# Patient Record
Sex: Female | Born: 1950 | Race: White | Hispanic: No | Marital: Single | Smoking: Never smoker
Health system: Southern US, Community
[De-identification: ages and names within clinical notes are randomized; demographics above are authoritative.]

## PROBLEM LIST (undated history)

## (undated) DIAGNOSIS — I10 Essential (primary) hypertension: Secondary | ICD-10-CM

## (undated) DIAGNOSIS — T8859XA Other complications of anesthesia, initial encounter: Secondary | ICD-10-CM

## (undated) DIAGNOSIS — R42 Dizziness and giddiness: Secondary | ICD-10-CM

## (undated) DIAGNOSIS — L4050 Arthropathic psoriasis, unspecified: Secondary | ICD-10-CM

## (undated) DIAGNOSIS — H269 Unspecified cataract: Secondary | ICD-10-CM

## (undated) DIAGNOSIS — M1990 Unspecified osteoarthritis, unspecified site: Secondary | ICD-10-CM

## (undated) DIAGNOSIS — E119 Type 2 diabetes mellitus without complications: Secondary | ICD-10-CM

## (undated) DIAGNOSIS — H409 Unspecified glaucoma: Secondary | ICD-10-CM

## (undated) HISTORY — PX: CARPAL TUNNEL RELEASE: SHX101

## (undated) HISTORY — PX: OTHER SURGICAL HISTORY: SHX169

## (undated) HISTORY — PX: FEMUR FRACTURE SURGERY: SHX633

## (undated) HISTORY — DX: Dizziness and giddiness: R42

## (undated) HISTORY — PX: CHOLECYSTECTOMY: SHX55

## (undated) HISTORY — DX: Essential (primary) hypertension: I10

## (undated) HISTORY — DX: Unspecified glaucoma: H40.9

## (undated) HISTORY — DX: Unspecified osteoarthritis, unspecified site: M19.90

## (undated) HISTORY — PX: TRIGGER FINGER RELEASE: SHX641

## (undated) HISTORY — PX: APPENDECTOMY: SHX54

## (undated) HISTORY — DX: Type 2 diabetes mellitus without complications: E11.9

## (undated) HISTORY — PX: TOTAL SHOULDER ARTHROPLASTY: SHX126

## (undated) HISTORY — DX: Unspecified cataract: H26.9

## (undated) HISTORY — PX: ABDOMINAL HYSTERECTOMY: SHX81

## (undated) HISTORY — PX: REPLACEMENT TOTAL KNEE BILATERAL: SUR1225

---

## 2002-03-24 ENCOUNTER — Encounter: Payer: Self-pay | Admitting: Orthopedic Surgery

## 2002-03-24 ENCOUNTER — Encounter: Admission: RE | Admit: 2002-03-24 | Discharge: 2002-03-24 | Payer: Self-pay | Admitting: Orthopedic Surgery

## 2002-08-25 ENCOUNTER — Encounter: Payer: Self-pay | Admitting: Orthopedic Surgery

## 2002-08-31 ENCOUNTER — Encounter (INDEPENDENT_AMBULATORY_CARE_PROVIDER_SITE_OTHER): Payer: Self-pay | Admitting: Specialist

## 2002-08-31 ENCOUNTER — Inpatient Hospital Stay (HOSPITAL_COMMUNITY): Admission: RE | Admit: 2002-08-31 | Discharge: 2002-09-03 | Payer: Self-pay | Admitting: Orthopedic Surgery

## 2002-10-22 ENCOUNTER — Encounter: Payer: Self-pay | Admitting: Orthopedic Surgery

## 2002-10-22 ENCOUNTER — Encounter: Admission: RE | Admit: 2002-10-22 | Discharge: 2002-10-22 | Payer: Self-pay | Admitting: Orthopedic Surgery

## 2003-02-26 ENCOUNTER — Encounter: Admission: RE | Admit: 2003-02-26 | Discharge: 2003-02-26 | Payer: Self-pay | Admitting: Rheumatology

## 2003-03-22 ENCOUNTER — Encounter: Admission: RE | Admit: 2003-03-22 | Discharge: 2003-03-22 | Payer: Self-pay | Admitting: Orthopedic Surgery

## 2003-04-14 ENCOUNTER — Ambulatory Visit (HOSPITAL_COMMUNITY): Admission: RE | Admit: 2003-04-14 | Discharge: 2003-04-14 | Payer: Self-pay | Admitting: Orthopedic Surgery

## 2003-04-14 ENCOUNTER — Ambulatory Visit (HOSPITAL_BASED_OUTPATIENT_CLINIC_OR_DEPARTMENT_OTHER): Admission: RE | Admit: 2003-04-14 | Discharge: 2003-04-14 | Payer: Self-pay | Admitting: Orthopedic Surgery

## 2003-07-05 ENCOUNTER — Inpatient Hospital Stay (HOSPITAL_COMMUNITY): Admission: RE | Admit: 2003-07-05 | Discharge: 2003-07-14 | Payer: Self-pay | Admitting: Orthopedic Surgery

## 2003-07-14 ENCOUNTER — Inpatient Hospital Stay (HOSPITAL_COMMUNITY)
Admission: RE | Admit: 2003-07-14 | Discharge: 2003-07-22 | Payer: Self-pay | Admitting: Physical Medicine & Rehabilitation

## 2003-08-04 ENCOUNTER — Ambulatory Visit (HOSPITAL_COMMUNITY): Admission: RE | Admit: 2003-08-04 | Discharge: 2003-08-04 | Payer: Self-pay | Admitting: Nephrology

## 2003-12-21 ENCOUNTER — Encounter: Admission: RE | Admit: 2003-12-21 | Discharge: 2003-12-21 | Payer: Self-pay | Admitting: Rheumatology

## 2006-12-16 ENCOUNTER — Encounter: Admission: RE | Admit: 2006-12-16 | Discharge: 2006-12-16 | Payer: Self-pay | Admitting: Orthopedic Surgery

## 2008-09-23 ENCOUNTER — Ambulatory Visit: Payer: Self-pay | Admitting: Hematology & Oncology

## 2008-10-20 LAB — CBC WITH DIFFERENTIAL (CANCER CENTER ONLY)
BASO#: 0.1 10*3/uL (ref 0.0–0.2)
Eosinophils Absolute: 0.1 10*3/uL (ref 0.0–0.5)
HCT: 39 % (ref 34.8–46.6)
LYMPH%: 19 % (ref 14.0–48.0)
MCH: 30.8 pg (ref 26.0–34.0)
MCV: 92 fL (ref 81–101)
MONO#: 0.9 10*3/uL (ref 0.1–0.9)
MONO%: 7.2 % (ref 0.0–13.0)
NEUT%: 72.2 % (ref 39.6–80.0)
RBC: 4.25 10*6/uL (ref 3.70–5.32)
WBC: 12.7 10*3/uL — ABNORMAL HIGH (ref 3.9–10.0)

## 2008-10-22 LAB — LACTATE DEHYDROGENASE: LDH: 177 U/L (ref 94–250)

## 2009-04-12 ENCOUNTER — Encounter: Admission: RE | Admit: 2009-04-12 | Discharge: 2009-06-21 | Payer: Self-pay | Admitting: Orthopedic Surgery

## 2009-06-28 ENCOUNTER — Encounter (HOSPITAL_COMMUNITY): Admission: RE | Admit: 2009-06-28 | Discharge: 2009-09-26 | Payer: Self-pay | Admitting: Rheumatology

## 2009-10-05 ENCOUNTER — Encounter (HOSPITAL_COMMUNITY): Admission: RE | Admit: 2009-10-05 | Discharge: 2010-01-03 | Payer: Self-pay | Admitting: Rheumatology

## 2009-10-11 ENCOUNTER — Encounter: Admission: RE | Admit: 2009-10-11 | Discharge: 2009-11-21 | Payer: Self-pay | Admitting: Rheumatology

## 2010-01-06 ENCOUNTER — Inpatient Hospital Stay (HOSPITAL_COMMUNITY): Admission: EM | Admit: 2010-01-06 | Discharge: 2010-01-13 | Payer: Self-pay | Admitting: Emergency Medicine

## 2010-01-09 ENCOUNTER — Ambulatory Visit: Payer: Self-pay | Admitting: Physical Medicine & Rehabilitation

## 2010-02-22 ENCOUNTER — Encounter
Admission: RE | Admit: 2010-02-22 | Discharge: 2010-03-21 | Payer: Self-pay | Source: Home / Self Care | Attending: Orthopedic Surgery | Admitting: Orthopedic Surgery

## 2010-03-13 ENCOUNTER — Other Ambulatory Visit: Payer: Self-pay | Admitting: Orthopedic Surgery

## 2010-03-13 DIAGNOSIS — Z780 Asymptomatic menopausal state: Secondary | ICD-10-CM

## 2010-03-15 ENCOUNTER — Encounter
Admission: RE | Admit: 2010-03-15 | Discharge: 2010-03-15 | Payer: Self-pay | Source: Home / Self Care | Attending: Orthopedic Surgery | Admitting: Orthopedic Surgery

## 2010-03-15 ENCOUNTER — Other Ambulatory Visit: Payer: Self-pay

## 2010-03-23 ENCOUNTER — Ambulatory Visit: Payer: Medicare Other | Attending: Orthopedic Surgery | Admitting: Physical Therapy

## 2010-03-23 DIAGNOSIS — R262 Difficulty in walking, not elsewhere classified: Secondary | ICD-10-CM | POA: Insufficient documentation

## 2010-03-23 DIAGNOSIS — M25569 Pain in unspecified knee: Secondary | ICD-10-CM | POA: Insufficient documentation

## 2010-03-23 DIAGNOSIS — M25669 Stiffness of unspecified knee, not elsewhere classified: Secondary | ICD-10-CM | POA: Insufficient documentation

## 2010-03-23 DIAGNOSIS — IMO0001 Reserved for inherently not codable concepts without codable children: Secondary | ICD-10-CM | POA: Insufficient documentation

## 2010-03-27 ENCOUNTER — Ambulatory Visit: Payer: Medicare Other | Admitting: Physical Therapy

## 2010-03-27 ENCOUNTER — Ambulatory Visit: Payer: Medicare Other | Admitting: Rehabilitation

## 2010-03-27 ENCOUNTER — Encounter: Payer: Medicare Other | Admitting: Rehabilitation

## 2010-03-27 ENCOUNTER — Encounter: Payer: Medicare Other | Admitting: Physical Therapy

## 2010-03-29 ENCOUNTER — Ambulatory Visit: Payer: Medicare Other | Admitting: Physical Therapy

## 2010-04-03 ENCOUNTER — Ambulatory Visit: Payer: Medicare Other | Admitting: Physical Therapy

## 2010-04-07 ENCOUNTER — Ambulatory Visit: Payer: Medicare Other | Admitting: Rehabilitation

## 2010-04-11 ENCOUNTER — Ambulatory Visit: Payer: Medicare Other | Admitting: Rehabilitation

## 2010-04-13 ENCOUNTER — Ambulatory Visit: Payer: Medicare Other | Admitting: Rehabilitation

## 2010-04-18 ENCOUNTER — Ambulatory Visit: Payer: Medicare Other | Admitting: Physical Therapy

## 2010-04-20 ENCOUNTER — Ambulatory Visit: Payer: Medicare Other | Attending: Orthopaedic Surgery | Admitting: Rehabilitation

## 2010-04-20 ENCOUNTER — Encounter: Payer: Medicare Other | Admitting: Physical Therapy

## 2010-04-20 DIAGNOSIS — IMO0001 Reserved for inherently not codable concepts without codable children: Secondary | ICD-10-CM | POA: Insufficient documentation

## 2010-04-20 DIAGNOSIS — R262 Difficulty in walking, not elsewhere classified: Secondary | ICD-10-CM | POA: Insufficient documentation

## 2010-04-20 DIAGNOSIS — M25569 Pain in unspecified knee: Secondary | ICD-10-CM | POA: Insufficient documentation

## 2010-04-20 DIAGNOSIS — M25669 Stiffness of unspecified knee, not elsewhere classified: Secondary | ICD-10-CM | POA: Insufficient documentation

## 2010-04-25 ENCOUNTER — Ambulatory Visit: Payer: Medicare Other | Admitting: Rehabilitation

## 2010-04-27 ENCOUNTER — Ambulatory Visit: Payer: Medicare Other | Admitting: Rehabilitation

## 2010-05-02 ENCOUNTER — Ambulatory Visit: Payer: Medicare Other | Admitting: Rehabilitation

## 2010-05-02 LAB — GLUCOSE, CAPILLARY
Glucose-Capillary: 120 mg/dL — ABNORMAL HIGH (ref 70–99)
Glucose-Capillary: 152 mg/dL — ABNORMAL HIGH (ref 70–99)
Glucose-Capillary: 155 mg/dL — ABNORMAL HIGH (ref 70–99)
Glucose-Capillary: 157 mg/dL — ABNORMAL HIGH (ref 70–99)
Glucose-Capillary: 165 mg/dL — ABNORMAL HIGH (ref 70–99)
Glucose-Capillary: 167 mg/dL — ABNORMAL HIGH (ref 70–99)
Glucose-Capillary: 172 mg/dL — ABNORMAL HIGH (ref 70–99)
Glucose-Capillary: 183 mg/dL — ABNORMAL HIGH (ref 70–99)
Glucose-Capillary: 194 mg/dL — ABNORMAL HIGH (ref 70–99)
Glucose-Capillary: 201 mg/dL — ABNORMAL HIGH (ref 70–99)
Glucose-Capillary: 201 mg/dL — ABNORMAL HIGH (ref 70–99)
Glucose-Capillary: 205 mg/dL — ABNORMAL HIGH (ref 70–99)
Glucose-Capillary: 212 mg/dL — ABNORMAL HIGH (ref 70–99)
Glucose-Capillary: 235 mg/dL — ABNORMAL HIGH (ref 70–99)
Glucose-Capillary: 268 mg/dL — ABNORMAL HIGH (ref 70–99)
Glucose-Capillary: 299 mg/dL — ABNORMAL HIGH (ref 70–99)
Glucose-Capillary: 63 mg/dL — ABNORMAL LOW (ref 70–99)

## 2010-05-02 LAB — CBC
HCT: 24.4 % — ABNORMAL LOW (ref 36.0–46.0)
HCT: 28.3 % — ABNORMAL LOW (ref 36.0–46.0)
HCT: 31.9 % — ABNORMAL LOW (ref 36.0–46.0)
Hemoglobin: 10.4 g/dL — ABNORMAL LOW (ref 12.0–15.0)
Hemoglobin: 7.4 g/dL — ABNORMAL LOW (ref 12.0–15.0)
MCH: 30.1 pg (ref 26.0–34.0)
MCH: 30.5 pg (ref 26.0–34.0)
MCHC: 32.2 g/dL (ref 30.0–36.0)
MCHC: 32.6 g/dL (ref 30.0–36.0)
MCHC: 32.9 g/dL (ref 30.0–36.0)
MCV: 92.5 fL (ref 78.0–100.0)
MCV: 93.1 fL (ref 78.0–100.0)
MCV: 94.7 fL (ref 78.0–100.0)
MCV: 94.9 fL (ref 78.0–100.0)
Platelets: 153 10*3/uL (ref 150–400)
Platelets: 176 10*3/uL (ref 150–400)
RBC: 2.57 MIL/uL — ABNORMAL LOW (ref 3.87–5.11)
RBC: 3.45 MIL/uL — ABNORMAL LOW (ref 3.87–5.11)
RDW: 15 % (ref 11.5–15.5)
RDW: 15.7 % — ABNORMAL HIGH (ref 11.5–15.5)
WBC: 12.1 10*3/uL — ABNORMAL HIGH (ref 4.0–10.5)
WBC: 9.5 10*3/uL (ref 4.0–10.5)

## 2010-05-02 LAB — PROTIME-INR
INR: 1.47 (ref 0.00–1.49)
INR: 1.72 — ABNORMAL HIGH (ref 0.00–1.49)
INR: 1.96 — ABNORMAL HIGH (ref 0.00–1.49)
INR: 2.73 — ABNORMAL HIGH (ref 0.00–1.49)
INR: 3 — ABNORMAL HIGH (ref 0.00–1.49)
Prothrombin Time: 20.3 seconds — ABNORMAL HIGH (ref 11.6–15.2)
Prothrombin Time: 22.5 seconds — ABNORMAL HIGH (ref 11.6–15.2)
Prothrombin Time: 29 seconds — ABNORMAL HIGH (ref 11.6–15.2)

## 2010-05-02 LAB — CROSSMATCH
ABO/RH(D): O POS
Antibody Screen: NEGATIVE
Unit division: 0
Unit division: 0
Unit division: 0
Unit division: 0

## 2010-05-02 LAB — BASIC METABOLIC PANEL
BUN: 15 mg/dL (ref 6–23)
BUN: 6 mg/dL (ref 6–23)
CO2: 28 mEq/L (ref 19–32)
Calcium: 8.4 mg/dL (ref 8.4–10.5)
Chloride: 105 mEq/L (ref 96–112)
Chloride: 106 mEq/L (ref 96–112)
Chloride: 107 mEq/L (ref 96–112)
Creatinine, Ser: 0.86 mg/dL (ref 0.4–1.2)
GFR calc Af Amer: >60 mL/min (ref >60)
GFR calc Af Amer: >60 mL/min (ref >60)
GFR calc non Af Amer: >60 mL/min (ref >60)
GFR calc non Af Amer: >60 mL/min (ref >60)
Glucose, Bld: 218 mg/dL — ABNORMAL HIGH (ref 70–99)
Glucose, Bld: 222 mg/dL — ABNORMAL HIGH (ref 70–99)
Potassium: 3.7 mEq/L (ref 3.5–5.1)
Potassium: 4.2 mEq/L (ref 3.5–5.1)
Potassium: 4.4 mEq/L (ref 3.5–5.1)
Sodium: 140 mEq/L (ref 135–145)

## 2010-05-02 LAB — URINALYSIS, ROUTINE W REFLEX MICROSCOPIC
Bilirubin Urine: NEGATIVE
Glucose, UA: NEGATIVE mg/dL
Protein, ur: NEGATIVE mg/dL
Urobilinogen, UA: 0.2 mg/dL (ref 0.0–1.0)

## 2010-05-02 LAB — COMPREHENSIVE METABOLIC PANEL
AST: 15 U/L (ref 0–37)
BUN: 9 mg/dL (ref 6–23)
CO2: 26 mEq/L (ref 19–32)
Calcium: 8.1 mg/dL — ABNORMAL LOW (ref 8.4–10.5)
Creatinine, Ser: 0.61 mg/dL (ref 0.4–1.2)
GFR calc Af Amer: >60 mL/min (ref >60)
GFR calc non Af Amer: >60 mL/min (ref >60)

## 2010-05-02 LAB — URINE MICROSCOPIC-ADD ON

## 2010-05-02 LAB — POCT I-STAT 4, (NA,K, GLUC, HGB,HCT)
Glucose, Bld: 210 mg/dL — ABNORMAL HIGH (ref 70–99)
Potassium: 4.6 mEq/L (ref 3.5–5.1)

## 2010-05-02 LAB — ABO/RH: ABO/RH(D): O POS

## 2010-05-03 ENCOUNTER — Encounter (HOSPITAL_COMMUNITY): Payer: Medicare Other

## 2010-05-04 ENCOUNTER — Ambulatory Visit: Payer: Medicare Other | Admitting: Physical Therapy

## 2010-05-09 ENCOUNTER — Ambulatory Visit: Payer: Medicare Other | Admitting: Physical Therapy

## 2010-05-10 ENCOUNTER — Ambulatory Visit (HOSPITAL_COMMUNITY): Payer: Medicare Other | Attending: Rheumatology

## 2010-05-10 DIAGNOSIS — L4050 Arthropathic psoriasis, unspecified: Secondary | ICD-10-CM | POA: Insufficient documentation

## 2010-05-12 ENCOUNTER — Ambulatory Visit: Payer: Medicare Other | Admitting: Physical Therapy

## 2010-05-16 ENCOUNTER — Ambulatory Visit: Payer: Medicare Other | Admitting: Physical Therapy

## 2010-05-18 ENCOUNTER — Ambulatory Visit: Payer: Medicare Other | Admitting: Physical Therapy

## 2010-05-23 ENCOUNTER — Ambulatory Visit: Payer: Medicare Other | Attending: Orthopedic Surgery | Admitting: Rehabilitation

## 2010-05-23 DIAGNOSIS — M25669 Stiffness of unspecified knee, not elsewhere classified: Secondary | ICD-10-CM | POA: Insufficient documentation

## 2010-05-23 DIAGNOSIS — R262 Difficulty in walking, not elsewhere classified: Secondary | ICD-10-CM | POA: Insufficient documentation

## 2010-05-23 DIAGNOSIS — IMO0001 Reserved for inherently not codable concepts without codable children: Secondary | ICD-10-CM | POA: Insufficient documentation

## 2010-05-23 DIAGNOSIS — M25569 Pain in unspecified knee: Secondary | ICD-10-CM | POA: Insufficient documentation

## 2010-05-25 ENCOUNTER — Ambulatory Visit: Payer: Medicare Other | Admitting: Rehabilitation

## 2010-05-30 ENCOUNTER — Ambulatory Visit: Payer: Medicare Other | Admitting: Rehabilitation

## 2010-06-01 ENCOUNTER — Ambulatory Visit: Payer: Medicare Other | Admitting: Rehabilitation

## 2010-06-06 ENCOUNTER — Encounter: Payer: Medicare Other | Admitting: Rehabilitation

## 2010-06-08 ENCOUNTER — Encounter: Payer: Medicare Other | Admitting: Physical Therapy

## 2010-06-21 ENCOUNTER — Encounter (HOSPITAL_COMMUNITY): Payer: Medicare Other

## 2010-07-07 NOTE — H&P (Signed)
[name], [name]                           ACCOUNT NO.:  [id number]   MEDICAL RECORD NO.:  [id number]                   PATIENT TYPE:  INP   LOCATION:  NA                                   FACILITY:  [facility]   PHYSICIAN:  Mila Homer. Sherlean Foot, M.D.              DATE OF BIRTH:  [birth year]   DATE OF ADMISSION:  07/05/2003  DATE OF DISCHARGE:                                HISTORY & PHYSICAL   CHIEF COMPLAINT:  Left knee pain.   HISTORY OF PRESENT ILLNESS:  The patient is a 60 year old white female with  a history of right total knee arthroplasty and a revision of that  arthroplasty in 2004 with continued pain.  The patient has also been having  progressively worsening left knee pain over the last year.  She does have a  pain which she describes as sharp, achy and dull at times.  It radiates up  into the hip.  She does have grinding, popping, and giving way with that  knee and it is causing her to have problems with her other hip joints.  X-  rays reveal bone on bone lateral compartment of the left knee.   ALLERGIES:  1. PENICILLIN.  2. ASPIRIN.  3. CODEINE.  4. DEMEROL.  5. PHENOTHIAZINES.  6. MORPHINE.  7. NSAIDS.  8. TEQUIN.  9. LEVAQUIN.  10.      ERYTHROMYCIN.  11.      SULFA.   CURRENT MEDICATIONS:  1. Aceon 2 mg p.o. daily.  2. Glucotrol XL 5 mg p.o. daily.  3. Actos 30 mg p.o. daily.  4. Neurontin 300 mg t.i.d.  5. Vicodin p.r.n.   PAST MEDICAL HISTORY:  1. Diabetes mellitus type 2.  2. Hypertension.  3. Obesity.   PAST SURGICAL HISTORY:  1. Cholecystectomy in 1970s.  2. Appendectomy in the 1970s.  3. Hysterectomy in the 1980s.  4. Right total knee arthroplasty in 2002.  5. Revision right total knee arthroplasty in 2004.  6. Right shoulder  rotator cuff repair in 2005.  7. The patient states that with her right total knee arthroplasty, she had     significant nausea, status post procedure for several weeks.   SOCIAL HISTORY:  The patient is a 60 year old  heavyset white female who  denies any history of smoking or alcohol use.  She is divorced.  She has  several grown children.  She lives in a one story house, five steps to the  main entrance.  She is employed as an [employer] fundraiser.   FAMILY PHYSICIAN:  Dr. Niel Hummer, [phone].   FAMILY HISTORY:  Mother deceased from CVA.  Father is alive with history of  COPD.  One brother and one sister alive, both with a history of asthma.   REVIEW OF SYMPTOMS:  Positive for glasses.  She occasionally has problems  with reflux for which she uses over-the-counter medications.  She has some  bladder frequency, otherwise all other review of systems categories are  negative.   PHYSICAL EXAMINATION:  GENERAL APPEARANCE:  This is a healthy-appearing,  well-developed, obese white female.  She walks with a significant swinging  limp from right to left.  She does have difficulty getting on the  examination table.  She is unable to lift her weight with her right leg and  when she uses her left leg, she must push up with her arms.  VITAL SIGNS:  Height is 5 feet 7 inches, weight 250 pounds, blood pressure  152/72, heart rate is 80 and regular, respiratory rate 12, the patient is  afebrile.  HEENT:  Head was normocephalic.  Pupils are equal, round and reactive.  Extraocular movements intact.  External ears without deformities.  Gross  hearing is intact.  Nasal septum is midline.  Oral buccal mucosa was pink  and moist.  No lesions.  NECK:  Supple.  No palpable lymphadenopathy.  Thyroid region nontender.  She  had good range of motion of her cervical spine without any difficulty or  tenderness.  CHEST:  Lung sounds were clear and equal bilaterally.  No wheezing, rhonchi,  rales or rubs noted.  CARDIOVASCULAR:  Regular rate and rhythm, no murmurs, rubs, or gallops.  ABDOMEN:  Round, obese-like.  Nontender to deep palpation.  Bowel sounds  normoactive.  EXTREMITIES:  Upper extremities:  The patient had fairly good  range of  motion of both shoulders, elbows and wrists.  Motor strength was 5/5.  Lower  extremities:  Right hip had full extension, flexion up to 120 degrees  with  20 degrees internal and external rotation without any discomfort.  Left hip  had full extension, flexion up to 100 degrees with any attempt at internal  and external rotation. There was no mechanical blocking but she did have a  pulling discomfort over the lateral aspect at the hip.  No mechanical  symptoms. Right knee had a well-healed midline surgical incision.  The knee  was round, boggy appearing.  She had no palpable effusion.  She was tender  throughout mostly on the lateral tibial tray region.  She had about 10  degrees short of full extension, flexion to 80 degrees, no instability.  Left knee was round, boggy appearing.  No signs of erythema or ecchymosis.  No palpable effusion.  She was extremely tender along the medial and lateral  joint line.  She had full extension, flexion up to 110 degrees.  She had  about 5 or 10 degrees of valgus or varus laxity with stress.  Both calves  were nontender.  Ankles were symmetric with good dorsiflexion and plantar  flexion.  PERIPHERAL VASCULAR:  Carotid pulses were 2+, no bruits.  Radial pulses 2+.  Dorsalis pedis pulses were 2+.  Unable to palpate posterior tibial pulses  due to soft tissue.  The skin blanched briskly.  No venostasis or  pigmentation changes.  She did have 1+ pitting edema in both anterior tibial  pressed regions distally.  NEUROLOGIC:  The patient was conscious, alert and appropriate, held decent  conversation with examiner.  Cranial nerves II-XII grossly intact.  She was  grossly intact to light touch sensation from head to toe.  There were no  gross neurologic defects noted.  BREASTS, RECTAL, GU:  Examinations were deferred at this time.   IMPRESSION:  1. 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 2. Failed right total knee  arthroplasty revision.  3. Obesity.  4.  Diabetes.  5. Hypertension.   PLAN:  The patient will be admitted to [facility] on  Jul 05, 2003, under the care of Dr. Sherlean Foot for planned left total knee  arthroplasty.  The patient will undergo all routine labs and tests prior to  this procedure.      Jamelle Rushing, P.A.                      Mila Homer. Sherlean Foot, M.D.    RWK/MEDQ  D:  06/29/2003  T:  06/29/2003  Job:  [id number]

## 2010-07-07 NOTE — Discharge Summary (Signed)
NAME:  [name], [name]                           ACCOUNT NO.:  [id number]   MEDICAL RECORD NO.:  [id number]                   PATIENT TYPE:  INP   LOCATION:  5027                                 FACILITY:  [facility]   PHYSICIAN:  Mila Homer. Sherlean Foot, M.D.              DATE OF BIRTH:  07-Aug-1950   DATE OF ADMISSION:  08/31/2002  DATE OF DISCHARGE:  09/03/2002                                 DISCHARGE SUMMARY   ADMISSION DIAGNOSES:  1. Chronic pain, right total knee arthroplasty.  2. Type 2 diabetes mellitus.  3. Hypertension.  4. Obesity.   DISCHARGE DIAGNOSES:  1. Chronic pain, right total knee arthroplasty, status post irrigation and     debridement and ligament balancing.  2. Pyrexia.  3. Constipation.  4. Type 2 diabetes mellitus.  5. Hypertension.  6. Obesity.   SURGICAL PROCEDURE:  On August 31, 2002, Ms. Liuzzi underwent an irrigation and  debridement and ligament balancing of her right total knee arthroplasty by  Dr. Mila Homer. Lucey, assisted by Oneida Alar, P.A.-C.   COMPLICATIONS:  None.   CONSULTS:  Case management, physical therapy, occupational therapy, and  [facility] consult on September 01, 2002.   HISTORY OF PRESENT ILLNESS:  This 60 year old white female patient presented  to Dr. Sherlean Foot with a history of a right knee replacement in October 2002 by  another physician.  She has had chronic pain in that knee since that time.  She was treated with closed manipulation after her knee replacement was done  due to decreased range of motion, but this did not help her pain.  The pain  is a chronic achy sensation over the lateral aspect of the knee, which is  worse with weightbearing, sitting, or lying down.  The knee swells, and it  pops, clicks, and buckles on her.  Because of her continued complaints of  pain, and complaints of instability, she is presenting for a right knee  possible revision.   HOSPITAL COURSE:  [name] tolerated her surgical procedure well and  without immediate postoperative complications.  The components were not  loose, so an irrigation and debridement and ligament balance was done and  not a revision.  Postop, she was transferred to 5000.  Her Hemovac was  accidentally DC'd at about midnight the night after surgery, and that was  left out.   On postop day one, T max was 101.9, vitals were stable.  Leg was  neurovascularly intact.  Hemoglobin 11.9, hematocrit 35.  She was started on  physical therapy, and plans were made for DC home in the next one day or so.   On postop day two, her T max had been 102.  She was feeling kind of bad but  no specific complaints, voiding without complaints, and has not had a bowel  movement.  Hemoglobin 10.7, hematocrit 30.8, and white count 11.4.  Her temp  was monitored, and she was kept another day.   On postop day three, she was feeling much better.  T max was 101.5, and her  fever curve had been decreasing with her being 99.2 that morning.  Right  knee incision was well approximated with staples with no signs of infection.  White count 8.8, hemoglobin 9.9, hematocrit 28.5.  She did have some  constipation that was treated with a laxative, but it was felt, after  discussion with Dr. Sherlean Foot, that she was ready for discharge home and will be  DC'd home at this time.   DISCHARGE INSTRUCTIONS:   DIET:  She will resume her regular diabetic diet.   MEDICATIONS:  She may resume her prehospitalization medications except for  the Mobic; these include:  1. Actos 30 mg p.o. daily.  2. Glucotrol XL 5 mg p.o. daily.  3. Neurontin 300 mg two tablets p.o. daily.  4. Aceon 2 mg p.o. daily.   ADDITIONAL MEDICATIONS:  1. Lovenox 40 mg subcu daily, 11 of those with no refill.  2. Vicodin 5/500 mg one to two p.o. q.4h. p.r.n. for pain, 50 of those with     no refill, max of 8 per day.  3. Robaxin 500 mg one to two p.o. q.6h. p.r.n. for spasms, 40 with no     refill.   ACTIVITY:  1. She can be out  of bed, weightbearing as tolerated on the right leg with     the use of a walker.  2. Ice pack to the right knee as needed for pain.  3. Home CPM 0 to 100 degrees six to eight hours per day.  4. She is to arrange for home health physical therapy, per [facility].   WOUND CARE:  1. She needs to keep the right incision clean and dry and may shower after     no drainage from the wound for two days.  2. She is to notify Dr. Sherlean Foot of temp greater than or equal to 101.5 degrees     Fahrenheit, chills, pain unrelieved by pain meds, or foul-smelling     drainage from the wounds.   FOLLOW UP:  She needs to follow up with Dr. Sherlean Foot in our office in  approximately 10 to 12 days.  She can follow up either in the [place]  office at [phone] for that appointment or the [facility] office, [phone]  to set up that appointment.   LABORATORY DATA:  Chest x-ray done on August 25, 2002, showed bilateral basilar  hypoaeration with heart and lungs appearing to be within normal limits,  considering the patient's body habitus.   On September 01, 2002, white count 11.9, hemoglobin 11.9, hematocrit 35, on September 02, 2002, white count 11.4, hemoglobin 10.7, hematocrit 30.8, and on September 03, 2002, white count 8.8, hemoglobin 9.9, hematocrit 28.5, and platelets  202.   On August 25, 2002, glucose 112, on September 01, 2002, glucose 189, BUN 5,  creatinine 0.7, and on September 02, 2002, sodium 137, potassium 3.4, glucose  185, BUN 4, creatinine 0.7, and calcium 8.2.   Synovial fluid taken from the right knee on August 31, 2002, showed red,  turbid fluid, white cells not counted, neutrophils were 40%, lymphocytes  54%, monocytes 4%, eos 2%; there were no crystals.   Urinalyses done on August 25, 2002, and September 02, 2002, were negative.  Wound  cultures taken August 31, 2002, showed abundant red cells  but no organisms and no growth.  Surgical pathology sent from August 31, 2002, was soft tissue of  the knee and showed  synovium with subsynovial fibrosis and focal histocytic  reaction; no increase in neutrophils identified.     Legrand Pitts Duffy, P.A.                      Mila Homer. Sherlean Foot, M.D.    KED/MEDQ  D:  09/03/2002  T:  09/04/2002  Job:  [id number]   cc:   Dr. Niel Hummer  [phone]    cc:   Dr. Niel Hummer  [phone]

## 2010-07-07 NOTE — H&P (Signed)
NAME:  [name], [name]                           ACCOUNT NO.:  [id number]   MEDICAL RECORD NO.:  [id number]                   PATIENT TYPE:  INP   LOCATION:  NA                                   FACILITY:  [facility]   PHYSICIAN:  Mila Homer. Sherlean Foot, M.D.              DATE OF BIRTH:  1950-03-19   DATE OF ADMISSION:  08/31/2002  DATE OF DISCHARGE:                                HISTORY & PHYSICAL   CHIEF COMPLAINT:  Chronic right knee pain.   HISTORY OF PRESENT ILLNESS:  Patient is a 60 year old female with a history  of right total knee arthroplasty in October 2002.  Patient has been having  chronic pain in that knee ever since.  She did have a closed manipulation,  status post arthroplasty, but this did not resolve her pain.  Patient states  pain is a chronic achy sensation over the lateral aspect of the knee.  It is  present when she is weightbearing, sitting or laying down.  She does have  swelling.  The knee does pop, click and buckle on her.  Patient finds it  very unstable.   ALLERGIES:  1. TALWIN.  2. ERYTHROMYCIN.  3. PENICILLIN.  4. ASPIRIN.  5. CODEINE.  6. MORPHINE.  7. TEQUIN.  8. DARVON/DARVOCET.  9. PHENERGAN.  10.      DEMEROL.  11.      All FENETHAZINE.   CURRENT MEDICATIONS:  1. Actos 30 mg p.o. q.d.  2. Glucotrol XL 5 mg p.o. q.d.  3. Neurontin 300 mg 2 tablets p.o. q.d.  4. _______ 2 mg p.o. q.d.  5. Mobic 7.5 mg p.o. q.d.  6. Lortab p.r.n.   PAST MEDICAL HISTORY:  Diabetes mellitus type 2, hypertension.   PAST SURGICAL HISTORY:  1. Cholecystectomy in 1979.  2. Appendectomy in 1974.  3. Hysterectomy in the 1980s.  4. Carpal tunnel release in 1980s.  5. Bladder surgery in 1980s.  6. Right total knee arthroplasty in October 2002.  Patient indicates with     her right total knee arthroplasty, the patient had significant and severe     nausea and vomiting for six weeks status post, and it was at that time     felt to be related to the anesthesia.   SOCIAL HISTORY:  Patient is a 60 year old white heavy set female.  Denies  any history of smoking or alcohol use.  She is currently divorced.  She does  have three grown children.  She lives in a [place] house, four steps to  the main entrance.  She is currently employed as an [employer] fundraiser.   FAMILY PHYSICIAN:  Dr. Niel Hummer at [phone].   FAMILY MEDICAL HISTORY:  Mother is deceased from a CVA.  Father is alive  with a history of cardiac issues, repaired AAA and emphysema.  Patient has  one brother alive with asthma and one sister alive in good  medical health.   REVIEW OF SYSTEMS:  Positive for glasses for distance uses.  She does have  chronic pain in her right lower extremity.  She does have problems with  urinary urgency and frequency related to previous bladder issues.  A  negative review of systems in all other areas related to sensory,  respiratory, cardiac, gastrointestinal, genitourinary, neurologic,  musculoskeletal, hematologic and mental status issues not mentioned above.   PHYSICAL EXAMINATION:  VITALS:  Height is 5 feet 7 inches, weight 225  pounds.  Blood pressure is 152/72, pulse of 80 and regular, respirations 12;  patient was afebrile.  GENERAL:  This is a healthy-appearing, well-developed, heavy-set white  female.  She does ambulate with a right-sided limp.  She does have  difficulty getting on and off the exam table due to weakness in her right  lower extremity.  HEENT:  Head was normocephalic/atraumatic.  Nontender maxillary and frontal  sinuses.  Pupils equal, round and reactive, accommodate to light.  Extraocular movements intact.  Sclerae are not icterus.  Conjunctivae are  pink and moist.  External ears without deformities.  Canals patent.  TMs  pearly gray and intact.  Gross hearing is intact.  Nasal septum was midline.  Oral buccal mucosa was pink and moist without lesions.  Dentition was in  fair repair.  Uvula was midline.  Patient is able to swallow without  any  difficulty.  NECK:  Supple.  No palpable lymphadenopathy.  Thyroid region was nontender.  Patient had good range of motion of her cervical spine without any  difficulty or tenderness.  She had no tenderness with percussion along the  entire spinal column.  CHEST:  Lung sounds were clear and equal bilaterally.  No wheezes, rales,  rhonchi, rubs noted.  HEART:  Regular rate and rhythm.  S1 and S2 were auscultated.  No murmurs,  rubs or gallops noted.  ABDOMEN:  Was round, soft, obese-like.  Nontender to deep palpation.  Bowel  sounds were normoactive, and she was nontender to percussion in the CVA  region.  EXTREMITIES:  Upper extremities were symmetrically sized and shaped.  She  had good range of motion of her shoulders, elbows, wrists bilaterally.  Motor strength is 5/5.  LOWER EXTREMITIES:  Right and left hip had full extension, flexion up to 100  degrees, limited due to abdominal girth and thigh girth, 20 degrees  internal/external rotation without any difficulty or mechanical symptoms.  Right knee had a well-healed midline surgical incision. She was slightly  swollen with the soft tissue around the knee.  No sign of erythema or  ecchymosis.  She was tender along the lateral joint and the proximal tibia.  She did have an obvious mechanical clunk but no significant instability.  She is about 5 degrees short of full extension and flexion, back to 95  degrees, limited due to tightness and girth of the leg.  Left knee was  without any signs of erythema or ecchymosis.  No palpable effusion.  She had  full extension/flexion, back to 100 degrees.  She was nontender throughout.  No instability.  Calves nontender.  Ankles were symmetric with good dorsi-  plantar flexion.  PERIPHERAL VASCULATURE:  Carotid pulses are 2+.  The bruits, radial pulses  2+.  Dorsalis pedis and posterior tibial pulses were 2+.  She had no lower  extremity edema or venous stasis.  She had diffuse varicosities in  the lower extremities.  NEUROLOGIC:  Patient was conscious, alert and appropriate, held a  nice  conversation with the examiner.  Cranial nerves II-XII grossly intact.  Deep  tendon reflexes of the upper and lower extremities are symmetric, right to  left.  She is grossly intact to light touch sensation.  She had no gross  neurologic defects noted.  BREASTS/RECTAL/GENITOURINARY:  Exams are deferred at this time.    IMPRESSION:  1. Chronic pain, right total knee arthroplasty.  2. Diabetes mellitus type 2.  3. Hypertension.  4. Obesity.   PLAN:  Patient will be admitted to [facility] under the care of  Mila Homer. Sherlean Foot, M.D. on August 31, 2002.  Patient will be taken to the OR  for evaluation of her right total knee arthroplasty with a possible  revision, due to loosening of the components.  The patient will be evaluated  intraoperatively for signs of infection.  Patient will undergo all routine  labs and tests prior to this procedure.     Jamelle Rushing, P.A.                      Mila Homer. Sherlean Foot, M.D.    RWK/MEDQ  D:  08/25/2002  T:  08/25/2002  Job:  [id number]

## 2010-07-07 NOTE — Op Note (Signed)
NAMEAVIN, UPPERMAN                           ACCOUNT NO.:  [id number]   MEDICAL RECORD NO.:  [id number]                   PATIENT TYPE:  INP   LOCATION:  2550                                 FACILITY:  [facility]   PHYSICIAN:  Mila Homer. Sherlean Foot, M.D.              DATE OF BIRTH:  May 29, 1950   DATE OF PROCEDURE:  08/31/2002  DATE OF DISCHARGE:                                 OPERATIVE REPORT   PREOPERATIVE DIAGNOSIS:  Painful right total knee replacement.   POSTOPERATIVE DIAGNOSIS:  Painful right total knee replacement.   PROCEDURE:  Right knee exploration with synovectomy, lateral ligament  releasing and irrigation and debridement.   INDICATIONS FOR PROCEDURE:  The patient is a 60 year old white female with a  painful total knee, well over  a year after the index operation. She was  ruled out for infection, other conservative measures were attempted and  informed consent was obtained for exploration and potential revision.   ANESTHESIA:  General.   SURGEON:  Mila Homer. Sherlean Foot, M.D.   ASSISTANT:  Jamelle Rushing, P.A.   DESCRIPTION OF PROCEDURE:  The patient was placed supine and administered  general anesthesia. The right lower extremity was prepped and draped in the  usual sterile fashion.  Inferolateral and inferomedial portals were made  with a 10 blade.  A cutting blade was used to make a median parapatellar  arthrotomy. There was a normal amount and quality of synovial fluid. This  was sent off for cell count, Gram stain which returned negative for  infection. I sent off synovium for frozen section which came back with less  than 5 white blood cells per high powered field. This indicated the absence  of  infection. There was an incredible amount of scar tissue  in the knee.  There  was actually no articulation between the patellar component and the  trochlea. It was completely obliterated by scar tissue  and there was also  scar tissue impinging between the femur and the  tibial polyethelene.   I did a very meticulous debridement and then could go up into flexion and  evaluate the lateral  side where on x-ray there was a small lucent line  which was questionable for loosening. I attempted to get beneath the tibial  tray as well as between the interface and was unable to do so. I tapped on  the tibial tray with a bone impactor mallet to detect any micromotion and  there was none.   I evaluated the ligament ________ and it was definitely more tight on the  lateral  side which I felt was possibly the 2nd reason for her pain. I  performed a lateral ligament release with pie crusting technique and got a  good balance. The post was not impinging except for with external rotation  and flexion.   Overall I felt that with the absence of infection that the main  reason for  her pain would be the synovitis and the lateral ligament structures being  under continuous stretch. I did not feel that a full revision was necessary,  so I completed the synovectomy and irrigated copiously with 3000 mL of  normal saline via the pulse lavage system.   I then tied down bleeding vessels, placed a Hemovac deep in the arthrotomy  and then  placed a pain pump drain superficial to the arthrotomy closure. I  then closed the deep soft tissue  with interrupted 0 Vicryl the subcuticular  with 2-0 Vicryl, the skin with staples and dressed with Adaptic, 4 x 4s,  sterile ABDs, Webril and TED stocking.   The patient tolerated the procedure well. There were no complications. There  was 1 Hemovac drain. Estimated blood loss was 250 cc. Tourniquet time was 32  minutes.                                               Mila Homer. Sherlean Foot, M.D.    SDL/MEDQ  D:  08/31/2002  T:  08/31/2002  Job:  [id number]

## 2010-07-07 NOTE — Op Note (Signed)
[name], [name]                           ACCOUNT NO.:  [id number]   MEDICAL RECORD NO.:  [id number]                   PATIENT TYPE:  AMB   LOCATION:  DSC                                  FACILITY:  [facility]   PHYSICIAN:  Mila Homer. Sherlean Foot, M.D.              DATE OF BIRTH:  [birth year]   DATE OF PROCEDURE:  04/14/2003  DATE OF DISCHARGE:                                 OPERATIVE REPORT   PREOPERATIVE DIAGNOSIS:  Right rotator cuff impingement with rotator cuff  tear.   POSTOPERATIVE DIAGNOSIS:  Right rotator cuff impingement with rotator cuff  tear.   OPERATION PERFORMED:  Right shoulder arthroscopy with subacromial  decompression, glenohumeral debridement and mini open rotator cuff repair.   SURGEON:  Mila Homer. Sherlean Foot, M.D.   ASSISTANT:  Legrand Pitts. Duffy, P.A.   ANESTHESIA:  General.   INDICATIONS FOR PROCEDURE:  The patient is a 60 year old white female with  mechanical symptoms, weakness and MRI evidence of rotator cuff tearing.  Informed consent was obtained.   DESCRIPTION OF PROCEDURE:  The patient was taken to the operating room and  administered general anesthesia in the supine position and then placed in  the beach chair position.  The right shoulder was prepped and draped in the  usual sterile fashion.  A #11 blade, blunt trocar and cannula were used to  create posterior, direct lateral and anterior portals sequentially.  Through  the posterior portal, we viewed the glenohumeral joint, did diagnostic  arthroscopy which showed some undersurface tearing at the rotator cuff  interval as well as some synovitis in the joint and some frayed anterior  labrum.  [name] put a 3.5 Cuda shaver through the anterior portal, debrided the  frayed labrum as well as the inflammation on the undersurface interval  rotator cuff tear.  [name] then redirected the camera from the posterior portal  into the subacromial space and through the direct lateral portal, [name] placed  the shaver.  [name] performed a  subtotal bursectomy.  [name] then followed that with  an ArthroCare wand to debride off the bursa off the undersurface of the  acromion over to the acromioclavicular joint but not into the [facility] joint.  [name]  then used a 4.0 mm cylindrical bur to perform acromioplasty and the  ArthroCare wand to perform a CA ligament release.  Once an adequate  decompression was done, the rotator cuff was debrided and found a 2 x 2 cm  tear exactly at the rotator cuff interval of the supraspinatus tendon.  [name]  then evacuated the arthroscopic portion of the case and went to the mini  open approach where [name] extended the direct lateral portal up 2 cm with the  #15 blade.  [name] obtained hemostasis and then pierced the deltoid along its  raphe and held it in place with an ArthRex shoulder retractor.  [name] could  easily then visualize the rotator cuff tear.  [name] then used the arthroscopic  bur to bur down to bone to some raw bone for healing at the base of the  rotator cuff tear.  [name] then placed three 5.0 mm Mitek corkscrew anchors and [name]  placed three simple and three vertical mattress sutures to repair the tear.  The tear came together nicely.  The tissue looked to be good in quality.  [name]  then further palpated to make sure that adequate decompression was done, was  not rubbing on our repair.  [name] then irrigated and closed the deltoid interval  with interrupted 0 Vicryls, the subcuticular with interrupted 2-0 Vicryls  and then skin Steri-Strips.  [name] closed the portals with 4-0 nylon sutures.   COMPLICATIONS:  None.   DRAINS:  None.                                               Mila Homer. Sherlean Foot, M.D.    SDL/MEDQ  D:  04/14/2003  T:  04/14/2003  Job:  [id number]

## 2010-07-07 NOTE — Op Note (Signed)
[name], [name]                           ACCOUNT NO.:  [id number]   MEDICAL RECORD NO.:  [id number]                   PATIENT TYPE:  INP   LOCATION:  5015                                 FACILITY:  [facility]   PHYSICIAN:  Mila Homer. Sherlean Foot, M.D.              DATE OF BIRTH:  1950-09-17   DATE OF PROCEDURE:  07/05/2003  DATE OF DISCHARGE:                                 OPERATIVE REPORT   PREOPERATIVE DIAGNOSIS:  Left knee osteoarthritis.   POSTOPERATIVE DIAGNOSIS:  Left knee osteoarthritis.   OPERATION PERFORMED:  Left total knee arthroplasty.   SURGEON:  Mila Homer. Sherlean Foot, M.D.   ASSISTANT:  Legrand Pitts. Duffy, P.A.   ANESTHESIA:  General plus preop femoral nerve block.   INDICATIONS FOR PROCEDURE:  The patient has failed conservative measures for  osteoarthritis of the knee.  Informed consent was obtained for total knee  arthroplasty.   DESCRIPTION OF PROCEDURE:  The patient was laid supine and administered  general anesthesia after a preop femoral nerve block.  The left lower  extremity was prepped and draped in the usual sterile fashion.  The Esmarch  was used to exsanguinate the extremity.  A 10 blade was used to make a  midline incision.  A new blade was used to make a median parapatellar  arthrotomy.  Synovectomy was performed.  Knee cap was everted and the  caliper was used to measure to 22 mm of thickness.  The reamer was used to  ream down to a thickness of 14 mm.  I then used a 32 mm template to drill  three lug holes and with the prosthetic trial in place, it also measured 22  mm.  I removed the prosthetic trial and flexed the knee.  I used a scalpel  to elevate the deep MCL off of the medial crest of the tibia, placed a Z-  retractor behind this.  I then used an extramedullary alignment system to  remove a perpendicular cut surface of the tibia removing 2 mm of bone off  the lateral tibial plateau.  After removing this piece, I then made an  intramedullary drill hole  in the femur.  I then placed an intramedullary  femoral guide set on 4 degree valgus and pinned the distal femoral cutting  block into place.  I removed the intramedullary guide and used a sagittal  saw to remove the distal femoral cuts.  I then removed the distal femoral  cutting guide.  I then marked out the epicondylar axis and the posterior  condylar angle measured 3 degrees.  I then used a sizing device, sized to a  size E and put pins through the 3 degree external rotation holes.  I then  placed a 4 in 1 cutting block in place, screwed it into place, removed the  pins and made the anterior, posterior and chamfer cuts with a sagittal saw.  I removed  the cut surfaces of the bones in the 4 in 1 cutting device.  I  then placed the lamina spreader in the knee and removed the medial and  lateral menisci, the posterior condylar osteophytes, the ACL and PCL.  I  then placed a 10 mm spacer block in the knee and had good flexion and  extension gap balancing.  I then finished the femur with a size E finishing  tray.  I finished the tibia with a size 3 tibial tray.  I then trialed with  a size 3 tibia, size E femur, size 10 poly insert and a size 32 patella.  I  had excellent patellar tracking and excellent flexion and extension gap  balance.  I then removed the trial component and copiously irrigated.  I  then cemented in a size 3 tibia, size E femur, snapped in the real  polyethylene, located the knee, put it in extension and then cemented in a  32 mm patella.  After the cement was hardened, I let the tourniquet down and  cauterized bleeding vessels.  I left the Hemovac deep to the arthrotomy.  I  closed the arthrotomy with interrupted #1 Vicryl figure-of-eight sutures,  the deep soft tissues with buried 0 Vicryl sutures, a subcuticular 2-0  Vicryl stitch and skin staples.  I then dressed with Adaptic, 4 x 4s, ABDs,  Webril and TED stockings.   COMPLICATIONS:  None.   DRAINS:  One  Hemovac.   ESTIMATED BLOOD LOSS:  300 mL.                                               Mila Homer. Sherlean Foot, M.D.    SDL/MEDQ  D:  07/05/2003  T:  07/06/2003  Job:  [id number]

## 2010-07-07 NOTE — Discharge Summary (Signed)
[name], [name]                           ACCOUNT NO.:  [id number]   MEDICAL RECORD NO.:  [id number]                   PATIENT TYPE:  IPS   LOCATION:  4144                                 FACILITY:  [facility]   PHYSICIAN:  Erick Colace, M.D.           DATE OF BIRTH:  05-27-1950   DATE OF ADMISSION:  07/14/2003  DATE OF DISCHARGE:  07/22/2003                                 DISCHARGE SUMMARY   DISCHARGE DIAGNOSES:  1. Left total knee replacement.  2. Escherichia coli Enterobacter urinary tract infection.  3. Acute renal failure requiring intermittent hemodialysis.  4. Diabetes mellitus.  5. Chronic nausea.  6. Postoperative anemia.   HISTORY OF PRESENT ILLNESS:  The patient is a 60 year old female with  history of hypertension, obesity, right total knee replacement with  revision, and left knee pain secondary to OA.  She elected to undergo left  total knee replacement on May 16, by Dr. Sherlean Foot.  Postoperative is  weightbearing as tolerated and on Lovenox for DVT prophylaxis.  Issues past  surgery include oliguria with acute renal failure, hypertension, nausea and  vomiting, and urine output remained marginal with uremia and somnolence with  BUN rising to 87 and creatinine to 6.5.  Hemodialysis catheter was placed on  May 22, and dialysis initiated with recommendation to continue this on  p.r.n. basis depending on urine output and the patient's symptomatology.  Therapies were initiated and the patient is currently at total assist +2 90%  for transfer.  She is ambulating 70 feet requiring maximum encouragement.   PAST MEDICAL HISTORY:  Significant for diabetes mellitus type 2,  hypertension, obesity, cholecystectomy, appendectomy, hysterectomy, history  of rosacea, psoriasis, hepatitis, right total knee replacement with revision  and now with loosening, right rotator cuff repair, and insomnia.   ALLERGIES:  PENICILLIN, PHENERGAN, ASPIRIN, MORPHINE, COX 2, ERYTHROMYCIN,  DARVOCET, NSAIDS, TEQUIN, LEVAQUIN.   HOSPITAL COURSE:  The patient was admitted to rehab on Jul 14, 2003, for  inpatient therapies to consist of PT and OT daily.  Past admission, diabetes  were monitored on a.c. and h.s. basis.  Blood sugars were noted to be  elevated past admission and Glucotrol XL was increased at 5 mg p.o. b.i.d.  She has had problems with nausea intermittently.  Zofran was used initially;  however, the patient reported some movement disorders with this, therefore,  this was discontinued.  Foley was discontinued past admission and the  patient was able to void without difficulty.  She was initially maintained  on Lovenox 30 mg subcu per day.  At 4:26 a.m. the patient was noted to spike  a temperature of 101.3.  She was noted to be flushed and feeling bad.  Her  knee incision was noted to be healing well.  There were no signs of  erythema.  A UA UCS was done showing positive WBC's at 21 to 50 with few  bacteria.  Bilateral  lower extremity Dopplers were also done and these were  negative for DVT.  Chest x-ray ordered showed low bilateral lung volumes, no  edema.  Renal was consulted for input and the patient was started on IV  Vancomycin as well as Cefepime and blood cultures x2 were drawn.  The  patient's blood cultures were negative, however, urine culture grew out  greater than 100,000 colonies of Escherichia coli Enterobacter.  She was  changed over to Cipro 500 mg a day on May 30. She is to continue on this  past discharge for seven total days of antibiotic therapy.  The patient has  been followed by renal services in terms of acute renal failure.  Daily labs  were done throughout her stay.  Initially at admission, BUN and creatinine  was at 43 and 5.9.  This did worsen to 55 and 71, respectively.  On May 28,  the patient was having worsening of headaches and nausea.  She was also  noted to have increase in weight, therefore, hemodialysis was undertaken on  May 29  with some improvement in her symptomatology.  She also underwent  hemodialysis on June 1, secondary to increase in nausea.  Labs at this time  showed potassium 140, potassium 4.1, chloride 101, CO2 29, BUN 31,  creatinine 5.3, glucose 142.  CBC showed hemoglobin 9.1, white count 8.1,  platelets 62.  On July 22, 2003, the patient reported minimal improvement in  her nausea.  She was afebrile without complaints of knee pain.  Check of  lytes past hemodialysis on June 2, showed sodium 142, potassium 4.0,  chloride 104, CO2 31, BUN 8, creatinine 2.8.  Daily BMP's to be drawn past  discharge by [facility] with results to be faxed to [facility].  The patient has been advised to keep a strict monitoring of her intake and  output plus daily weights.  She is to contact renal services for any uremic  symptoms.  During her stay in rehab, the patient has made good progress.  She is currently modified independent for transfers, modified independent  for ambulating 120 to 200 feet, knee flexion is at 104 degrees.  She is  modified independent for ADL's.  Further follow-up therapies to include home  health PT and OT by [facility] with home health RN arranged to draw  pro-times and monitor hemodialysis catheter site.  On July 22, 2003, the  patient is discharged to home.   DISCHARGE MEDICATIONS:  1. Norvasc 5 mg per day.  2. Os-Cal 500 mg t.i.d.  3. Glucotrol XL 5 mg b.i.d.  4. Actos 30 mg.  5. Nephro-Vite one per day.  6. Ultram 50 mg b.i.d.  7. Oxycodone IR 5 to 10 mg q.4-6h p.r.n. pain.  8. Cipro 500 mg per day x3 days.   ACTIVITY:  Use walker.   DIET:  Diabetic diet.  Strict record of intake and output.   WOUND CARE:  Keep area clean and dry.   DISCHARGE INSTRUCTIONS:  No alcohol and no driving.  Check blood sugars  b.i.d.  Weight daily.  Weight at discharge at 117 kg.  Do not use Neurontin,  NSAID's, or aspirin.  FOLLOW UP:  The patient is to follow up with Dr. Sherlean Foot  for an appointment in  the next one to two weeks.  Follow up with [facility] on Tuesday, June  7 at 4:30.  Follow up by Erick Colace, M.D. as needed.  Greg Cutter, P.A.                    Erick Colace, M.D.    PP/MEDQ  D:  07/22/2003  T:  07/23/2003  Job:  [id number]   cc:   Mila Homer. Sherlean Foot, M.D.  [address] [place]  [place] [address]  Fax: [phone]   Dr. Amie Critchley, Fransisco Hertz, M.D.  [address]  [place]  [place] [address]  Fax: [phone]

## 2010-07-07 NOTE — Consult Note (Signed)
[name], [name]                           ACCOUNT NO.:  [id number]   MEDICAL RECORD NO.:  [id number]                   PATIENT TYPE:  INP   LOCATION:  5015                                 FACILITY:  [facility]   PHYSICIAN:  Mindi Slicker. Lowell Guitar, M.D.               DATE OF BIRTH:  02-24-1950   DATE OF CONSULTATION:  DATE OF DISCHARGE:                                   CONSULTATION   REFERRING PHYSICIAN:  Dr. Mila Homer. Lucey.   HISTORY OF PRESENT ILLNESS:  [name] is a 60 year old female with history  of diabetes mellitus, who underwent a left total knee replacement on Jul 05, 2003, complicated by postoperative hypotension associated with a hemoglobin  variation, narcotic administration and blood pressure medications.  She  developed oliguric acute renal failure with serial laboratory studies  showing the following:  BUN/creatinine, Jun 29, 2003 -- 11/0.5; Jul 06, 2003  -- 6/0.6; Jul 07, 2003 -- 12/1.8; Jul 07, 2003 -- 15/1.9, Jul 08, 2003 --  33/2.6.  Urine output was down to 10 mL in 24 hours yesterday.   PAST HISTORY:  1. Diabetes.  2. Hypertension.  3. Status post appendectomy.  4. Status post cholecystectomy.  5. Status post hysterectomy.  6. Status post right total knee replacement and revision, 2002.  7. Obesity.  8. Peripheral neuropathy.   CURRENT MEDICATIONS:  Current medications include insulin, Dilaudid,  Lovenox, Glucotrol, Actos, Neurontin, furosemide.  Prior to admission, the  patient was taking the ACE inhibitor, Aceon; during this hospitalization,  she was taking Mavik, which is currently on hold.   SOCIAL HISTORY:  She is a divorced R.N., does not smoke or consume alcoholic  beverages.   PHYSICAL EXAMINATION:  VITAL SIGNS:  Blood pressure 147/88, 40 mL of urine  output so far today recorded on the computer.  GENERAL:  She is an obese Caucasian female.  She is uncomfortable because of  pain.  HEENT:  Atraumatic, normocephalic.  Extraocular movements are  intact.  LUNGS:  Her lungs are clear anteriorly.  HEART:  Regular rate and rhythm.  ABDOMEN:  Abdomen soft, obese.  No masses or tenderness.  EXTREMITIES:  Trace edema, right lower extremity.  Postop changes of left  knee.  NEUROLOGIC:  No obvious focality.   LABORATORY DATA:  BUN 33, creatinine 2.6.  Hemoglobin 10.5 today.  Urinalysis:  Protein 30 mg/dl, 3-6 white blood cells, 21-50 red blood cells.   ASSESSMENT:  Oliguric acute renal failure.  The etiology of this acute renal  failure is hemodynamically mediated acute renal failure secondary to  hypotension in the postoperative setting, ACE inhibitor and surgical anemia.   RECOMMENDATIONS:  1. I agreed with holding the ACE inhibitor and allowing blood pressure to     drift up.  2. I would attempt to convert the patient to a nonoliguric state with     diuretics as you are doing.  3. Continue to volume expand due to blood losses during surgery.  4. If prolonged oliguria persists, would recommend an MRA to assess renal     arteries.   Thanks for letting us see this patient.                                               Mindi Slicker. Lowell Guitar, M.D.    ACP/MEDQ  D:  07/08/2003  T:  07/10/2003  Job:  [id number]

## 2010-07-07 NOTE — Discharge Summary (Signed)
[name], [name]                           ACCOUNT NO.:  [id number]   MEDICAL RECORD NO.:  [id number]                   PATIENT TYPE:  INP   LOCATION:  5015                                 FACILITY:  [facility]   PHYSICIAN:  Mila Homer. Sherlean Foot, M.D.              DATE OF BIRTH:  Oct 19, 1950   DATE OF ADMISSION:  07/05/2003  DATE OF DISCHARGE:  07/14/2003                                 DISCHARGE SUMMARY   ADMITTING DIAGNOSES:  1. End-stage osteoarthritis, bone on bone, in the medial compartment of left     knee.  2. History of failed right total knee arthroplasty revision.  3. Obesity.  4. Diabetes.  5. Hypertension.   DISCHARGE DIAGNOSES:  1. Status post left total knee arthroplasty.  2. Uncontrolled diabetes mellitus.  3. Acute renal failure with oliguria.  4. Hypotension secondary to narcotics.  5. Acute renal failure, which is hemodynamically mediated; acute renal     failure is secondary to hypertension in the postoperative setting, ACE     inhibitor and surgical anemia.  The patient did require hemodialysis for     acute renal failure.  6. Acute blood loss anemia secondary to surgery, not requiring any blood     products.  7. Poor progress with physical therapy due to oliguria, hypotension and     nausea.  8. Obesity.  9. History of right total knee arthroplasty revision.   HISTORY OF PRESENT ILLNESS:  [name] is a 60 year old white female with a  history of right total knee arthroplasty and revision of that arthroplasty  in 2004 with continued pain.  The patient has been having progressively  worsening left knee pain over the past year.  She describes her left knee  pain as sharp, achy to dull pain at times.  The pain does radiate up into  her left hip.  The patient does have symptoms of giving way; she also noted  grinding and popping in the knee.  X-rays of the left knee reveal bone-on-  bone in the lateral compartment.   ALLERGIES:  PENICILLIN, ASPIRIN, CODEINE,  DEMEROL, PHENOTHIAZINES, MORPHINE,  NSAIDS, TEQUIN, LEVAQUIN, ERYTHROMYCIN and SULFA.   CURRENT MEDICATIONS:  1. Aceon 2 mg p.o. daily.  2. Glucotrol XL 5 mg p.o. daily.  3. Actos 30 mg p.o. daily.  4. Neurontin 300 mg t.i.d.  5. Vicodin p.r.n.   SURGICAL PROCEDURE:  The patient was taken to the operating room on Jul 05, 2003 by Dr. Mila Homer. Lucey, assisted by Legrand Pitts Duffy, P.A.  The patient  was placed under general anesthesia and also received a preoperative femoral  nerve block.  The patient then underwent a left total knee arthroplasty.  The following components were used:  Size 3 tibia, size E femur, 10  polyethylene insert and a size 32 patella.  The patient tolerated the  procedure well and returned to recovery in good and  stable condition.   CONSULTS:  The following consults were obtained while patient was  hospitalized:  1. PT.  2. OT.  3. Rehab.  4. Case Management.  5. [employer].  6. Renal Service.   HOSPITAL COURSE:  Postop day 1, the patient developed acute blood loss  anemia secondary to surgery, hemoglobin and hematocrit 11.1 and 32.8.  Patient was asymptomatic at that time.  The patient also developed nausea  and this was treated.  She also developed left hip bursitis, therefore, an  injection with Depo-Marcaine was given into the left hip in the greater  trochanteric region.   Postop day 2, the patient developed hypertension; due to this, the PCA was  discontinued and holding all pain medicines except for extreme pain.  A  bolus of normal saline was given.  The patient's diabetes was also noted to  be uncontrolled.  Glucotrol was adjusted.  Basal insulin, 15 units Lantus  daily, was started.  The patient had decreased urinary output; a 500-mL  bolus of normal saline was given.  The patient's ACE inhibitor was also held  on postop day 2 due to acute renal failure.  Postop day 3, hypotension  resolved.  The patient's acute renal  failure continued with increased BUN  and creatinine, therefore, renal ultrasound was performed to rule out  hydronephrosis.  UA with microscopy was checked.  Lasix was increased to 160  mg q.8 h. with 180-mg bolus given.  Renal consult was made.  Postop day 4,  the patient's acute renal failure was non-oliguric; however, the patient did  develop microhematuria and this is being followed by renal services.  Ultrasound of abdomen showed no hydronephrosis.  Rehab consult was made,  postop day 6, patient still in acute renal failure, marginal voiding output.  Patient developed nausea probably secondary to uremia.  Postop day 7,  catheter was placed and the patient underwent renal dialysis for acute renal  failure.  Postop day 7, patient's diabetes mellitus and CBGs were stable and  Lantus was held.  Postop day 8, the patient developed left hip pain and back  pain secondary to lying in bed.  It was felt that this would resolve with  physical therapy, once the patient was mobilized.  Postop day 9, acute renal  failure now oliguria, resolving phase, patient on hemodialysis.  Patient was  transferred to rehab in good stable condition.   LABORATORIES:  CBC on admission:  All values were within normal limits.  At  time of discharge to rehab, the patient's hemoglobin was 10.4, hematocrit  30.5, white blood cells were 11.4 and platelets 251,000.  Routine hepatic  enzymes drawn on Jun 29, 2003 were all within normal limits.  Phosphate  levels were checked on Jul 10, 2003 through Jul 13, 2003 and ranged from 6.8  to 8.2.  Urinalysis on admission was negative except for uric acid crystals  were seen.  Urinalysis on Jul 08, 2003 showed hemoglobin to be large,  protein 30, leukocyte esterase small, epithelial cells few, bacteria many.  Routine chemistries on admission were all within normal limits except for glucose, which was elevated at 244.  Renal function panel performed on Jul 13, 2003 showed sodium  to be 133, potassium 4.5, chloride 99, carbon dioxide  24, glucose 187, BUN 56, creatinine 5.8, calcium 8.2, phosphorus 6.8,  albumin 2.3.   X-RAYS:  Two-view chest x-ray performed on August 25, 2003 showed bilateral  basilar hypoaeration.  Heart and lungs  appeared within normal limits,  considering the patient's body habitus.   Ultrasound -- renal/aorta -- negative for hydronephrosis.   Fluoroscopic-guided CV line performed on Jul 11, 2003 showed placement of a  tunneled dialysis catheter via right internal jugular vein with distal  catheter tip line in the right atrium.  No pneumothorax was noted.   DISCHARGE INSTRUCTIONS:   MEDICATIONS:  1. Neurontin 300 mg p.o. daily.  2. Os-Cal 500 mg p.o. a.c.  3. Actos 30 mg p.o. daily.  4. Norvasc 5 mg p.o. daily.  5. Insulin -- human Regular/Novolin (Novolin/Humulin R) 2-15 units subcu     t.i.d. a.c. with the following sliding scale:  CBG less than 60 --     hypoglycemia protocol; CBG 60-100 -- no units; CBG 101-150 -- 2 units;     CBC 151-200 --3 units; CBG 201-250 -- 6 units; CBG 251-300 -- 9 units;     and CBG 301-350 -- 12 units; above 350 -- 15 units and call M.D.  6. Colace 100 mg p.o. b.i.d.  7. Senokot 1 tab p.o. b.i.d. a.c.  8. Tylenol 325 mg to 650 mg p.o. q.4 h. p.r.n.  9. Lovenox 30 mg subcu for a total of 14 days, started on Jul 06, 2003.  10.      Mavik 1 mg p.o. daily.  11.      Glucotrol 5 mg p.o. b.i.d.  12.      Vicodin 500 mg p.o. q.4 h. p.r.n. pain.  13.      Zofran 4 mg p.o. q.8 h. p.r.n. nausea.   ACTIVITY:  Patient is weightbearing as tolerated.   DIET:  Modified-carb diet.   CONDITION ON DISCHARGE:  The patient was discharged to rehab in good stable  condition.   FOLLOWUP:  The patient will need followup with Dr. Sherlean Foot in the office 14  days from surgery for staple removal and x-rays.  The patient may need  followup MRA to rule out renal artery stenosis due to patient's acute renal  failure.  The patient  will continue renal dialysis as indicated by renal  service.   WOUND CARE:  Wound is to be kept clean and dry with daily dressing changes.  May shower after 2 days if no drainage from the wound.  Patient needs to  call our office at [phone] if temperature is greater than 101.5, swelling,  foul-smelling drainage or if pain is not controlled with pain medications.      Richardean Canal, P.A.                       Mila Homer. Sherlean Foot, M.D.    GC/MEDQ  D:  08/17/2003  T:  08/17/2003  Job:  [id number]   cc:   Mila Homer. Sherlean Foot, M.D.  [address] [place]  [place] [address]  Fax: [phone]   phone [phone] Chapman Fitch.D.

## 2010-09-29 ENCOUNTER — Encounter (HOSPITAL_COMMUNITY): Payer: Medicare Other | Attending: Rheumatology

## 2010-09-29 DIAGNOSIS — L4050 Arthropathic psoriasis, unspecified: Secondary | ICD-10-CM | POA: Insufficient documentation

## 2010-10-10 ENCOUNTER — Ambulatory Visit: Payer: Medicare Other | Admitting: Physical Therapy

## 2010-10-18 ENCOUNTER — Ambulatory Visit: Payer: Medicare Other | Attending: Orthopedic Surgery | Admitting: Physical Therapy

## 2010-10-18 DIAGNOSIS — M25669 Stiffness of unspecified knee, not elsewhere classified: Secondary | ICD-10-CM | POA: Insufficient documentation

## 2010-10-18 DIAGNOSIS — IMO0001 Reserved for inherently not codable concepts without codable children: Secondary | ICD-10-CM | POA: Insufficient documentation

## 2010-10-18 DIAGNOSIS — M25569 Pain in unspecified knee: Secondary | ICD-10-CM | POA: Insufficient documentation

## 2010-10-18 DIAGNOSIS — R262 Difficulty in walking, not elsewhere classified: Secondary | ICD-10-CM | POA: Insufficient documentation

## 2010-10-18 DIAGNOSIS — Z96659 Presence of unspecified artificial knee joint: Secondary | ICD-10-CM | POA: Insufficient documentation

## 2010-10-24 ENCOUNTER — Ambulatory Visit: Payer: Medicare Other | Admitting: Rehabilitation

## 2010-10-26 ENCOUNTER — Ambulatory Visit: Payer: Medicare Other | Attending: Orthopedic Surgery | Admitting: Physical Therapy

## 2010-10-26 DIAGNOSIS — R262 Difficulty in walking, not elsewhere classified: Secondary | ICD-10-CM | POA: Insufficient documentation

## 2010-10-26 DIAGNOSIS — M25669 Stiffness of unspecified knee, not elsewhere classified: Secondary | ICD-10-CM | POA: Insufficient documentation

## 2010-10-26 DIAGNOSIS — Z96659 Presence of unspecified artificial knee joint: Secondary | ICD-10-CM | POA: Insufficient documentation

## 2010-10-26 DIAGNOSIS — M25569 Pain in unspecified knee: Secondary | ICD-10-CM | POA: Insufficient documentation

## 2010-10-26 DIAGNOSIS — IMO0001 Reserved for inherently not codable concepts without codable children: Secondary | ICD-10-CM | POA: Insufficient documentation

## 2010-10-31 ENCOUNTER — Ambulatory Visit: Payer: Medicare Other | Admitting: Rehabilitation

## 2010-11-02 ENCOUNTER — Ambulatory Visit: Payer: Medicare Other | Admitting: Physical Therapy

## 2010-11-07 ENCOUNTER — Ambulatory Visit: Payer: Medicare Other | Admitting: Physical Therapy

## 2010-11-09 ENCOUNTER — Ambulatory Visit: Payer: Medicare Other | Admitting: Physical Therapy

## 2010-11-14 ENCOUNTER — Ambulatory Visit: Payer: Medicare Other | Admitting: Physical Therapy

## 2010-11-16 ENCOUNTER — Ambulatory Visit: Payer: Medicare Other | Admitting: Physical Therapy

## 2010-11-21 ENCOUNTER — Ambulatory Visit: Payer: Medicare Other | Attending: Orthopedic Surgery | Admitting: Physical Therapy

## 2010-11-21 DIAGNOSIS — R262 Difficulty in walking, not elsewhere classified: Secondary | ICD-10-CM | POA: Insufficient documentation

## 2010-11-21 DIAGNOSIS — Z96659 Presence of unspecified artificial knee joint: Secondary | ICD-10-CM | POA: Insufficient documentation

## 2010-11-21 DIAGNOSIS — M25669 Stiffness of unspecified knee, not elsewhere classified: Secondary | ICD-10-CM | POA: Insufficient documentation

## 2010-11-21 DIAGNOSIS — IMO0001 Reserved for inherently not codable concepts without codable children: Secondary | ICD-10-CM | POA: Insufficient documentation

## 2010-11-21 DIAGNOSIS — M25569 Pain in unspecified knee: Secondary | ICD-10-CM | POA: Insufficient documentation

## 2010-11-23 ENCOUNTER — Ambulatory Visit: Payer: Medicare Other | Admitting: Physical Therapy

## 2010-11-27 ENCOUNTER — Encounter (HOSPITAL_COMMUNITY): Payer: Medicare Other | Attending: Rheumatology

## 2010-11-27 DIAGNOSIS — L4050 Arthropathic psoriasis, unspecified: Secondary | ICD-10-CM | POA: Insufficient documentation

## 2010-11-28 ENCOUNTER — Ambulatory Visit: Payer: Medicare Other | Admitting: Physical Therapy

## 2010-11-30 ENCOUNTER — Ambulatory Visit: Payer: Medicare Other | Admitting: Physical Therapy

## 2010-12-05 ENCOUNTER — Ambulatory Visit: Payer: Medicare Other | Admitting: Physical Therapy

## 2010-12-07 ENCOUNTER — Encounter: Payer: Medicare Other | Admitting: Physical Therapy

## 2010-12-08 ENCOUNTER — Ambulatory Visit: Payer: Medicare Other | Admitting: Physical Therapy

## 2010-12-08 ENCOUNTER — Encounter (HOSPITAL_COMMUNITY): Payer: Medicare Other

## 2010-12-12 ENCOUNTER — Ambulatory Visit: Payer: Medicare Other | Admitting: Physical Therapy

## 2010-12-13 ENCOUNTER — Encounter (HOSPITAL_COMMUNITY): Payer: Medicare Other

## 2010-12-14 ENCOUNTER — Encounter: Payer: Medicare Other | Admitting: Physical Therapy

## 2010-12-15 ENCOUNTER — Ambulatory Visit: Payer: Medicare Other | Admitting: Physical Therapy

## 2010-12-19 ENCOUNTER — Ambulatory Visit: Payer: Medicare Other | Admitting: Physical Therapy

## 2010-12-21 ENCOUNTER — Ambulatory Visit: Payer: Medicare Other

## 2010-12-26 ENCOUNTER — Encounter: Payer: Medicare Other | Admitting: Physical Therapy

## 2010-12-28 ENCOUNTER — Encounter: Payer: Medicare Other | Admitting: Physical Therapy

## 2010-12-28 ENCOUNTER — Other Ambulatory Visit (HOSPITAL_COMMUNITY): Payer: Self-pay | Admitting: *Deleted

## 2011-01-01 ENCOUNTER — Encounter (HOSPITAL_COMMUNITY)
Admission: RE | Admit: 2011-01-01 | Discharge: 2011-01-01 | Disposition: A | Discharge status: Home / Self Care | Payer: Medicare Other | Source: Ambulatory Visit | Attending: Rheumatology | Admitting: Rheumatology

## 2011-01-01 DIAGNOSIS — L4050 Arthropathic psoriasis, unspecified: Secondary | ICD-10-CM | POA: Insufficient documentation

## 2011-01-01 MED ORDER — DIPHENHYDRAMINE HCL 25 MG PO CAPS
ORAL_CAPSULE | ORAL | Status: AC
  Filled 2011-01-01: qty 1

## 2011-01-01 MED ORDER — ACETAMINOPHEN 325 MG PO TABS
650.0000 mg | ORAL_TABLET | ORAL | Status: DC
  Administered 2011-01-01: 650 mg via ORAL

## 2011-01-01 MED ORDER — ACETAMINOPHEN 325 MG PO TABS
ORAL_TABLET | ORAL | Status: AC
  Filled 2011-01-01: qty 2

## 2011-01-01 MED ORDER — METHYLPREDNISOLONE SODIUM SUCC 125 MG IJ SOLR
40.0000 mg | Freq: Once | INTRAMUSCULAR | Status: AC
  Administered 2011-01-01: 40 mg via INTRAVENOUS

## 2011-01-01 MED ORDER — ONDANSETRON HCL 4 MG/2ML IJ SOLN
4.0000 mg | Freq: Once | INTRAMUSCULAR | Status: AC
  Administered 2011-01-01: 4 mg via INTRAVENOUS

## 2011-01-01 MED ORDER — SODIUM CHLORIDE 0.9 % IV SOLN
6.0000 mg/kg | INTRAVENOUS | Status: DC
  Administered 2011-01-01: 600 mg via INTRAVENOUS
  Filled 2011-01-01: qty 60

## 2011-01-01 MED ORDER — ONDANSETRON HCL 4 MG/2ML IJ SOLN
INTRAMUSCULAR | Status: AC
  Filled 2011-01-01: qty 2

## 2011-01-01 MED ORDER — DIPHENHYDRAMINE HCL 25 MG PO TABS
25.0000 mg | ORAL_TABLET | ORAL | Status: DC
  Administered 2011-01-01: 25 mg via ORAL
  Filled 2011-01-01: qty 1

## 2011-01-01 MED ORDER — METHYLPREDNISOLONE SODIUM SUCC 40 MG IJ SOLR
INTRAMUSCULAR | Status: AC
  Filled 2011-01-01: qty 1

## 2011-01-01 NOTE — Progress Notes (Signed)
1410 pt given solumedrol and zofran.  Pt states she is feeling better since the solumedrol and zofran have been given.  Pt is resting comfortably.  Will continue to monitor pt closely.

## 2011-01-01 NOTE — Progress Notes (Signed)
Pt c/o palpitations after 10 minutes of remicade being started.  Pt red, sweaty, and nauseous. Stopped remicade, started saline and called to speak with Dr. Corliss Skains to report the above.  Orders received.

## 2011-01-01 NOTE — Progress Notes (Signed)
States she feels much better.  Denies pain . No longer senses palpitations.  Color improved.  Skin warm and dry.   BP unchanged and heart rate at 64 from 88.  States she will call Dr Corliss Skains tomorrow and pt was instructed to  Go to ED if she experiences any problems after d/c.  Patient and son voice understanding.  Pt states she feels like she did on arrival to [facility].

## 2011-01-02 ENCOUNTER — Encounter: Payer: Medicare Other | Admitting: Physical Therapy

## 2011-01-04 ENCOUNTER — Encounter: Payer: Medicare Other | Admitting: Physical Therapy

## 2014-09-07 ENCOUNTER — Other Ambulatory Visit (HOSPITAL_COMMUNITY): Payer: Self-pay | Admitting: Orthopedic Surgery

## 2014-09-07 DIAGNOSIS — M25561 Pain in right knee: Secondary | ICD-10-CM

## 2014-09-09 ENCOUNTER — Encounter (HOSPITAL_COMMUNITY)
Admission: RE | Admit: 2014-09-09 | Discharge: 2014-09-09 | Disposition: A | Discharge status: Home / Self Care | Payer: Medicare Other | Source: Ambulatory Visit | Attending: Orthopedic Surgery | Admitting: Orthopedic Surgery

## 2014-09-09 DIAGNOSIS — M25561 Pain in right knee: Secondary | ICD-10-CM | POA: Insufficient documentation

## 2014-09-09 DIAGNOSIS — Z96659 Presence of unspecified artificial knee joint: Secondary | ICD-10-CM | POA: Insufficient documentation

## 2014-09-09 MED ORDER — TECHNETIUM TC 99M MEDRONATE IV KIT
25.0000 | PACK | Freq: Once | INTRAVENOUS | Status: AC | PRN
  Administered 2014-09-09: 25 via INTRAVENOUS

## 2015-10-18 ENCOUNTER — Encounter (HOSPITAL_BASED_OUTPATIENT_CLINIC_OR_DEPARTMENT_OTHER): Payer: Self-pay

## 2015-10-18 ENCOUNTER — Ambulatory Visit (HOSPITAL_BASED_OUTPATIENT_CLINIC_OR_DEPARTMENT_OTHER): Admit: 2015-10-18 | Payer: Self-pay | Admitting: Orthopedic Surgery

## 2015-10-18 SURGERY — RELEASE, A1 PULLEY, FOR TRIGGER FINGER
Anesthesia: Monitor Anesthesia Care | Site: Finger | Laterality: Right

## 2015-12-01 ENCOUNTER — Ambulatory Visit (INDEPENDENT_AMBULATORY_CARE_PROVIDER_SITE_OTHER): Payer: Medicare Other | Admitting: Rheumatology

## 2015-12-01 DIAGNOSIS — G4709 Other insomnia: Secondary | ICD-10-CM | POA: Diagnosis not present

## 2015-12-01 DIAGNOSIS — L408 Other psoriasis: Secondary | ICD-10-CM

## 2015-12-01 DIAGNOSIS — M25561 Pain in right knee: Secondary | ICD-10-CM | POA: Diagnosis not present

## 2015-12-01 DIAGNOSIS — R5381 Other malaise: Secondary | ICD-10-CM

## 2015-12-01 DIAGNOSIS — Z79899 Other long term (current) drug therapy: Secondary | ICD-10-CM

## 2015-12-01 DIAGNOSIS — M797 Fibromyalgia: Secondary | ICD-10-CM | POA: Diagnosis not present

## 2015-12-14 ENCOUNTER — Telehealth: Payer: Self-pay | Admitting: Radiology

## 2015-12-14 NOTE — Telephone Encounter (Signed)
Refill request received from [employer], Cosentyx

## 2015-12-16 MED ORDER — SECUKINUMAB 150 MG/ML ~~LOC~~ SOAJ: 300.0000 mg | SUBCUTANEOUS | 0 refills | Status: DC

## 2015-12-16 NOTE — Telephone Encounter (Signed)
Last visit 12/01/15 Next visit 03/06/16 Labs 12/03/15 TB neg 05/24/15  Fax [phone] 

## 2015-12-16 NOTE — Telephone Encounter (Signed)
Last visit 12/01/15 Next visit 03/06/16 Labs 12/03/15 TB neg 05/24/15  Fax [phone]

## 2015-12-30 ENCOUNTER — Telehealth: Payer: Self-pay | Admitting: Rheumatology

## 2015-12-30 MED ORDER — SECUKINUMAB 150 MG/ML ~~LOC~~ SOAJ: 300.0000 mg | SUBCUTANEOUS | 0 refills | Status: DC

## 2015-12-30 NOTE — Telephone Encounter (Signed)
Patient is requesting refill of cosyntex. Please send to [facility]. Fax# [phone]

## 2015-12-30 NOTE — Telephone Encounter (Signed)
Last visit 12/01/15 Next visit 03/06/16 Labs 12/01/15 Neg TB gold 05/24/15 Ok to refill per Dr Corliss Skainseveshwar  This was previously sent, but did not go, have printed will refax to number she provided

## 2016-01-27 ENCOUNTER — Telehealth: Payer: Self-pay | Admitting: Pharmacist

## 2016-01-27 NOTE — Telephone Encounter (Signed)
Received letter in the mail from patient with re-enrollment form for [employer] Patient [employer] for [employer].    Last visit: 11/29/15 Nest visit: 03/06/16 Standing labs: 12/01/15 were normal.  Most recent standing lab order was written for standing labs every 2 months.  Patient is due for labs next week.   TB Gold 05/21/15 negative  Discussed with Dr. Corliss Skainseveshwar.  Okay to send in re-enrollment form for Cosentyx patient assistance program.  Form was faxed to the [employer] patient assistance program.  I called patient to update her.  Also advised patient that she is due for standing labs next week.  Patient confirms she will come by the office next week for standing labs.     Lilla Shookachel Sravya Grissom, Pharm.D., BCPS Clinical Pharmacist Pager: [phone] Phone: [phone] 01/27/2016 2:13 PM

## 2016-02-03 ENCOUNTER — Other Ambulatory Visit (INDEPENDENT_AMBULATORY_CARE_PROVIDER_SITE_OTHER): Payer: Self-pay | Admitting: *Deleted

## 2016-02-03 DIAGNOSIS — Z79899 Other long term (current) drug therapy: Secondary | ICD-10-CM

## 2016-02-03 LAB — COMPLETE METABOLIC PANEL WITH GFR
ALT: 10 U/L (ref 6–29)
AST: 12 U/L (ref 10–35)
Albumin: 3.5 g/dL — ABNORMAL LOW (ref 3.6–5.1)
Alkaline Phosphatase: 57 U/L (ref 33–130)
BUN: 16 mg/dL (ref 7–25)
CHLORIDE: 101 mmol/L (ref 98–110)
CO2: 31 mmol/L (ref 20–31)
CREATININE: 0.88 mg/dL (ref 0.50–0.99)
Calcium: 9.1 mg/dL (ref 8.6–10.4)
GFR, EST AFRICAN AMERICAN: 80 mL/min (ref >=60)
GFR, EST NON AFRICAN AMERICAN: 69 mL/min (ref >=60)
Glucose, Bld: 236 mg/dL — ABNORMAL HIGH (ref 65–99)
Potassium: 4.9 mmol/L (ref 3.5–5.3)
Sodium: 140 mmol/L (ref 135–146)
Total Bilirubin: 0.4 mg/dL (ref 0.2–1.2)
Total Protein: 6.3 g/dL (ref 6.1–8.1)

## 2016-02-03 LAB — CBC WITH DIFFERENTIAL/PLATELET
BASOS PCT: 0 %
Basophils Absolute: 0 cells/uL (ref 0–200)
EOS ABS: 168 {cells}/uL (ref 15–500)
Eosinophils Relative: 2 %
HCT: 43.7 % (ref 35.0–45.0)
Hemoglobin: 14 g/dL (ref 11.7–15.5)
LYMPHS PCT: 40 %
Lymphs Abs: 3360 cells/uL (ref 850–3900)
MCH: 28.5 pg (ref 27.0–33.0)
MCHC: 32 g/dL (ref 32.0–36.0)
MCV: 89 fL (ref 80.0–100.0)
MONOS PCT: 6 %
MPV: 10.3 fL (ref 7.5–12.5)
Monocytes Absolute: 504 cells/uL (ref 200–950)
Neutro Abs: 4368 cells/uL (ref 1500–7800)
Neutrophils Relative %: 52 %
PLATELETS: 249 10*3/uL (ref 140–400)
RBC: 4.91 MIL/uL (ref 3.80–5.10)
RDW: 13.2 % (ref 11.0–15.0)
WBC: 8.4 10*3/uL (ref 3.8–10.8)

## 2016-02-06 NOTE — Progress Notes (Signed)
Labs stable except for high glucose

## 2016-02-08 ENCOUNTER — Telehealth: Payer: Self-pay | Admitting: Rheumatology

## 2016-02-08 MED ORDER — PREDNISONE 5 MG PO TABS: ORAL_TABLET | ORAL | 0 refills | Status: DC

## 2016-02-08 NOTE — Telephone Encounter (Signed)
OK to Prescribe  Prednisone 5mg:  4po qAM x 4 days,  3po qAM x 4 days, 2po qAM x 4 days,  1po qAM x 4 days, 1/2po qAM x 4 days, then stop.; disp 42 pills w/ no refills.  Take until all gone and exactly as Rx'd. If diabetic, watch sugar levels and have pcp make adjustments for better control if needed.

## 2016-02-08 NOTE — Telephone Encounter (Signed)
Patient states she is currently on Cosentyx. Patient has been on the Cosentyx for approximately 3 months. Patient states she is having stiffness and aching all over her body. Patient states she feels like she did prior to starting Cosentyx. Patient is requesting a prescription for prednisone to help with the discomfort.

## 2016-02-08 NOTE — Telephone Encounter (Signed)
Patient is requesting prednisone because cosentyx has not kicked in yet. Patient [facility] [place].

## 2016-02-08 NOTE — Telephone Encounter (Signed)
Prescription sent to pharmacy and patient advised.

## 2016-02-24 ENCOUNTER — Telehealth: Payer: Self-pay

## 2016-02-24 NOTE — Telephone Encounter (Signed)
Spoke with representative from [employer] for [employer]'s Cosentyx. Caitlin StanleyStacey (representative) states that her renewal application has been approved for 2018. Approval letters have not yet been sent out. They are still working on her insurance verification that is set to be done in March 2018. She also states that this will not prevent her form getting her meds. I also informed them that she will be due for a refill in February. Patient needs to call [employer] [phone] one week before she is due to ensure she gets her meds on time.   Spoke with [name] who voiced understanding.   Reference number for claim : [id number]  Will scan approval letter once received.  [name], Sy[place]asta, CPhT

## 2016-03-06 ENCOUNTER — Ambulatory Visit: Payer: Medicare Other | Admitting: Rheumatology

## 2016-03-06 ENCOUNTER — Telehealth: Payer: Self-pay | Admitting: Rheumatology

## 2016-03-06 NOTE — Telephone Encounter (Signed)
Patient says she tried ordering her cosentyx and they do not have an rx for patient. Please advise.

## 2016-03-12 ENCOUNTER — Other Ambulatory Visit: Payer: Self-pay | Admitting: Radiology

## 2016-03-12 MED ORDER — SECUKINUMAB 150 MG/ML ~~LOC~~ SOAJ: 300.0000 mg | SUBCUTANEOUS | 0 refills | Status: DC

## 2016-03-12 NOTE — Telephone Encounter (Signed)
02/03/16 labs normal Next visit 03/21/15 She gets meds from [facility]  Faxed it in  [place]

## 2016-03-13 ENCOUNTER — Telehealth: Payer: Self-pay | Admitting: Rheumatology

## 2016-03-13 NOTE — Telephone Encounter (Signed)
Trish calling from [employer] Patient [employer]   Rx  Cosentyx  150 mg  Quantity of  3 for 90 days  [employer] requires prior authorization  [employer]'s  #  Is 800 [id number] [place] Response time is 24-72 hours   Please fax back (approved or denied) to:  [phone]   With any issues or questions please call [facility] at [phone]

## 2016-03-16 NOTE — Telephone Encounter (Signed)
Sent PA through Cover My Meds on 1/24. We are waiting for a response.

## 2016-03-19 NOTE — Progress Notes (Signed)
[facility]. Her authorization has been approved until 03/17/2018. Ms. Paris LoreWiley is enrolled into the [facility] [employer] [facility] Patient Assistance [employer]. It required that an authorization be sent to the primary insurance and the response to be faxed to [employer].  The approval has been faxed to [phone] along with the patients [employer] ID ([id number])  Will send documents to scan center.   Catalino Plascencia, Casmaliahasta, CPhT

## 2016-03-20 ENCOUNTER — Encounter: Payer: Self-pay | Admitting: Rheumatology

## 2016-03-20 ENCOUNTER — Ambulatory Visit (INDEPENDENT_AMBULATORY_CARE_PROVIDER_SITE_OTHER): Payer: Medicare Other | Admitting: Rheumatology

## 2016-03-20 VITALS — BP 155/68 | HR 81 | Resp 16 | Ht 67.0 in | Wt 250.0 lb

## 2016-03-20 DIAGNOSIS — R5382 Chronic fatigue, unspecified: Secondary | ICD-10-CM | POA: Diagnosis not present

## 2016-03-20 DIAGNOSIS — F5101 Primary insomnia: Secondary | ICD-10-CM

## 2016-03-20 DIAGNOSIS — J209 Acute bronchitis, unspecified: Secondary | ICD-10-CM | POA: Diagnosis not present

## 2016-03-20 DIAGNOSIS — M797 Fibromyalgia: Secondary | ICD-10-CM

## 2016-03-20 DIAGNOSIS — L408 Other psoriasis: Secondary | ICD-10-CM

## 2016-03-20 DIAGNOSIS — L4050 Arthropathic psoriasis, unspecified: Secondary | ICD-10-CM

## 2016-03-20 DIAGNOSIS — Z79899 Other long term (current) drug therapy: Secondary | ICD-10-CM

## 2016-03-20 NOTE — Progress Notes (Signed)
Office Visit Note  Patient: [name]             Date of Birth: [birth year]           MRN: [id number]             PCP: Sheral Apley Referring: No ref. provider found Visit Date: 03/20/2016 Occupation: '@GUAROCC' @    Subjective:  Follow-up Psoriatic arthritis and psoriasis and Cosentyx  History of Present Illness: [name] is a 66 y.o. female  Last seen 12/01/2015  Patient has responded well to her current treatment with biologic. However over the last 20 days she's been suffering from bronchitis. She is also had bronchospasms which prevented her from breathing well. Her doctor has had to treat her with 2 courses of antibiotics. (Clindamycin). She is finally feeling 80% better as of today.  She has responded well to her biologic treatment Note that she has failed Enbrel in the past.  She also suffers from fibromyalgia which is also causing her problems from time to time. On the last visit her fibromyalgia pain was a 5 on a scale of 0-10. Today, her pain is about a 6.  She recently has had to double do prednisone for her bronchitis. I suspect that the prednisone is indirectly helping her psoriatic arthritis.   Due to the bronchitis, patient correctly held off on the treatment with Cosentyx temporarily until she gets better from the bronchitis.  Activities of Daily Living:  Patient reports morning stiffness for 60 minutes.   Patient Reports nocturnal pain.  Difficulty dressing/grooming: Reports Difficulty climbing stairs: Reports Difficulty getting out of chair: Reports Difficulty using hands for taps, buttons, cutlery, and/or writing: Reports   Review of Systems  Constitutional: Positive for fatigue.  HENT: Negative for mouth sores and mouth dryness.   Eyes: Negative for dryness.  Respiratory: Negative for shortness of breath.   Gastrointestinal: Negative for constipation and diarrhea.  Musculoskeletal: Positive for myalgias and myalgias.  Skin:  Negative for sensitivity to sunlight.  Psychiatric/Behavioral: Positive for sleep disturbance. Negative for decreased concentration.    PMFS History:  There are no active problems to display for this patient.   Past Medical History:  Diagnosis Date  . Arthritis   . Cataract   . Diabetes mellitus without complication ([place])   . Glaucoma   . Hypertension     Family History  Problem Relation Age of Onset  . Cancer Sister   . Diabetes Sister   . COPD Brother   . Diabetes Brother    Past Surgical History:  Procedure Laterality Date  . ABDOMINAL HYSTERECTOMY    . APPENDECTOMY    . CARPAL TUNNEL RELEASE    . CHOLECYSTECTOMY    . FEMUR FRACTURE SURGERY    . REPLACEMENT TOTAL KNEE BILATERAL    . TOTAL SHOULDER ARTHROPLASTY    . TRIGGER FINGER RELEASE     Social History   Social History Narrative  . No narrative on file     Objective: Vital Signs: BP (!) 155/68 (BP Location: Left Arm, Patient Position: Sitting, Cuff Size: Normal)   Pulse 81   Resp 16   Ht '5\' 7"'  (1.702 m)   Wt 250 lb (113.4 kg)   BMI 39.16 kg/m    Physical Exam  Constitutional: She is oriented to person, place, and time. She appears well-developed and well-nourished.  HENT:  Head: Normocephalic and atraumatic.  Eyes: EOM are normal. Pupils are equal, round, and reactive to light.  Cardiovascular:  Normal rate, regular rhythm and normal heart sounds.  Exam reveals no gallop and no friction rub.   No murmur heard. Pulmonary/Chest: Effort normal and breath sounds normal. She has no wheezes. She has no rales.  Abdominal: Soft. Bowel sounds are normal. She exhibits no distension. There is no tenderness. There is no guarding. No hernia.  Musculoskeletal: Normal range of motion. She exhibits no edema, tenderness or deformity.  Lymphadenopathy:    She has no cervical adenopathy.  Neurological: She is alert and oriented to person, place, and time. Coordination normal.  Skin: Skin is warm and dry. Capillary  refill takes less than 2 seconds. No rash noted.  Psychiatric: She has a normal mood and affect. Her behavior is normal.  Nursing note and vitals reviewed.    Musculoskeletal Exam:  Full range of motion of all joints except has decrease fist formation in the right hand. Right hand has about 80-90% fist formation.  This is due to the old damage from in the past. Fibromyalgia tender points are seen out of 18 tender points  CDAI Exam: CDAI Homunculus Exam:   Tenderness:  Right hand: 3rd MCP  Swelling:  Right hand: 3rd MCP  Joint Counts:  CDAI Tender Joint count: 1 CDAI Swollen Joint count: 1  Global Assessments:  Patient Global Assessment: 7 Provider Global Assessment: 7  CDAI Calculated Score: 16  Right third MCP joint with synovial thickening and tenderness. All other joints are doing well at this time.  Investigation: No additional findings. Orders Only on 02/03/2016  Component Date Value Ref Range Status  . WBC 02/03/2016 8.4  3.8 - 10.8 K/uL Final  . RBC 02/03/2016 4.91  3.80 - 5.10 MIL/uL Final  . Hemoglobin 02/03/2016 14.0  11.7 - 15.5 g/dL Final  . HCT 02/03/2016 43.7  35.0 - 45.0 % Final  . MCV 02/03/2016 89.0  80.0 - 100.0 fL Final  . MCH 02/03/2016 28.5  27.0 - 33.0 pg Final  . MCHC 02/03/2016 32.0  32.0 - 36.0 g/dL Final  . RDW 02/03/2016 13.2  11.0 - 15.0 % Final  . Platelets 02/03/2016 249  140 - 400 K/uL Final  . MPV 02/03/2016 10.3  7.5 - 12.5 fL Final  . Neutro Abs 02/03/2016 4368  1,500 - 7,800 cells/uL Final  . Lymphs Abs 02/03/2016 3360  850 - 3,900 cells/uL Final  . Monocytes Absolute 02/03/2016 504  200 - 950 cells/uL Final  . Eosinophils Absolute 02/03/2016 168  15 - 500 cells/uL Final  . Basophils Absolute 02/03/2016 0  0 - 200 cells/uL Final  . Neutrophils Relative % 02/03/2016 52  % Final  . Lymphocytes Relative 02/03/2016 40  % Final  . Monocytes Relative 02/03/2016 6  % Final  . Eosinophils Relative 02/03/2016 2  % Final  . Basophils  Relative 02/03/2016 0  % Final  . Smear Review 02/03/2016 Criteria for review not met   Final  . Sodium 02/03/2016 140  135 - 146 mmol/L Final  . Potassium 02/03/2016 4.9  3.5 - 5.3 mmol/L Final  . Chloride 02/03/2016 101  98 - 110 mmol/L Final  . CO2 02/03/2016 31  20 - 31 mmol/L Final  . Glucose, Bld 02/03/2016 236* 65 - 99 mg/dL Final  . BUN 02/03/2016 16  7 - 25 mg/dL Final  . Creat 02/03/2016 0.88  0.50 - 0.99 mg/dL Final   Comment:   For patients > or = 66 years of age: The upper reference limit for Creatinine is approximately 13%  higher for people identified as African-American.     . Total Bilirubin 02/03/2016 0.4  0.2 - 1.2 mg/dL Final  . Alkaline Phosphatase 02/03/2016 57  33 - 130 U/L Final  . AST 02/03/2016 12  10 - 35 U/L Final  . ALT 02/03/2016 10  6 - 29 U/L Final  . Total Protein 02/03/2016 6.3  6.1 - 8.1 g/dL Final  . Albumin 02/03/2016 3.5* 3.6 - 5.1 g/dL Final  . Calcium 02/03/2016 9.1  8.6 - 10.4 mg/dL Final  . GFR, Est African American 02/03/2016 80  >=60 mL/min Final  . GFR, Est Non African American 02/03/2016 69  >=60 mL/min Final     Imaging: No results found.  Speciality Comments: No specialty comments available.    Procedures:  No procedures performed Allergies: Aspirin; Bee venom; Cephalosporins; Clarithromycin; Codeine; Demerol; E-mycin [erythromycin base]; Etodolac; Gatifloxacin; Iodinated diagnostic agents; Levaquin  [levofloxacin in d5w]; Levofloxacin; Levofloxacin; Meperidine; Morphine and related; Nsaids; Other; Phenothiazines; Shellfish-derived products; Tequin; Penicillins; and Sulfa drugs cross reactors   Assessment / Plan:     Visit Diagnoses: Psoriatic arthropathy ([place])  Other psoriasis  High risk medications (not anticoagulants) long-term use - cosentyx  Fibromyalgia  Chronic fatigue  Primary insomnia  Bronchitis, acute, with bronchospasm - 03/20/2016: Has had 2 treatments with clindamycin and finally improving somewhat    Plan: #1: Patient was offered prednisone taper since she is still having's little bit of joint pain and swelling third MCP joint. She declines at the moment stating that she is doing relatively well and does not need it. However she does have an engagement to go to and middle of February. She definitely wants to be better for that engagement. She will call us by the end of this week if she feels that she needs that prednisone taper. I'll be more than happy to write it for her if she calls me.   #2: Restart Cosentyx when bronchitis resolves  #3: Labs will be due mid-February to late February 2018  #4: Return to clinic in 4 months  Orders: No orders of the defined types were placed in this encounter.  No orders of the defined types were placed in this encounter.   Face-to-face time spent with patient was 30 minutes. 50% of time was spent in counseling and coordination of care.  Follow-Up Instructions: Return in about 4 months (around 07/18/2016) for PsA,Ps,Cosentyx,Bronchitisx20d;fms.   Eliezer Lofts, PA-C  Note - This record has been created using [employer].  Chart creation errors have been sought, but may not always  have been located. Such creation errors do not reflect on  the standard of medical care.

## 2016-03-22 NOTE — Progress Notes (Signed)
Received a fax from [employer] regarding an approval to receive her COSENTYX for free for the remainder of the calendar year.    Reference number/Patient ID: [id number] Phone number:[phone]   Will scan document into epic.  Unique Sillas, Rivannahasta, CPhT   8:50 AM

## 2016-04-16 ENCOUNTER — Telehealth: Payer: Self-pay | Admitting: Rheumatology

## 2016-04-16 NOTE — Telephone Encounter (Signed)
Caitlin CornfieldStephanie with [employer] called asking if this patient needs the sure click auto injector for her Enbrel.  [id number]#[phone]#[phone].  Thank you.

## 2016-04-20 NOTE — Telephone Encounter (Signed)
Patient is not on Enbrel she is on Cosentyx. Cover my meds will call with correct patient information.

## 2016-04-26 ENCOUNTER — Telehealth: Payer: Self-pay | Admitting: Rheumatology

## 2016-04-26 NOTE — Telephone Encounter (Signed)
Patient called and is wanting to know if Dr. Corliss Skainseveshwar will send in a prescription for prednisone or something else for her pain.  What she is doing is not helping.  She uses [facility] in [place].  The pharmacy's number is [phone].  Patient's Cb#[phone].  Thank you.

## 2016-04-26 NOTE — Telephone Encounter (Signed)
Patient states she is stiff all over her body and states it hurts to move. Patient is currently on Cosentyx. Patient had to come off for about a month due to an infection and she has had two injections since restarting. Patient is requesting a prescription for Prednisone.

## 2016-04-26 NOTE — Telephone Encounter (Signed)
OK to Prescribe  Prednisone 5mg :  4po qAM x 4 days,  3po qAM x 4 days, 2po qAM x 4 days,  1po qAM x 4 days, 1/2po qAM x 4 days, then stop.; disp 42 pills w/ no refills.

## 2016-04-26 NOTE — Telephone Encounter (Signed)
Attempted to contact the patient and left message for patient to call the office.  

## 2016-04-27 MED ORDER — PREDNISONE 5 MG PO TABS: ORAL_TABLET | ORAL | 0 refills | Status: DC

## 2016-04-27 NOTE — Telephone Encounter (Signed)
Prescription sent to the pharmacy and patient notified.  

## 2016-07-06 DIAGNOSIS — M797 Fibromyalgia: Secondary | ICD-10-CM | POA: Insufficient documentation

## 2016-07-06 DIAGNOSIS — R5382 Chronic fatigue, unspecified: Secondary | ICD-10-CM | POA: Insufficient documentation

## 2016-07-06 DIAGNOSIS — L408 Other psoriasis: Secondary | ICD-10-CM | POA: Insufficient documentation

## 2016-07-06 DIAGNOSIS — L4050 Arthropathic psoriasis, unspecified: Secondary | ICD-10-CM | POA: Insufficient documentation

## 2016-07-06 DIAGNOSIS — F5101 Primary insomnia: Secondary | ICD-10-CM | POA: Insufficient documentation

## 2016-07-06 DIAGNOSIS — Z79899 Other long term (current) drug therapy: Secondary | ICD-10-CM | POA: Insufficient documentation

## 2016-07-06 NOTE — Progress Notes (Signed)
Office Visit Note  Patient: [name]             Date of Birth: 03/29/1950           MRN: [id number]             PCP: Ardith Dark, PA-C Referring: Ardith Dark, PA-C Visit Date: 07/18/2016 Occupation: '@GUAROCC' @    Subjective:  Medication Management   History of Present Illness: [name] is a 66 y.o. female  Last seen in our office on June 8 30 2018. Patient has been doing very well with Cosentyx until last 3-4 months. She feels that she is having more pain lately whereas the Cosentyx used to work really well when she first started taking it.  Patient reports that when she takes her Cosentyx she feels well, however, on week 2, 3 and then 4 , she has gradually increased pain.  She also has fibromyalgia. She reports that it is hard to tell fibromyalgia pain from her psoriatic arthritis pain. In fact, she states that "I hurt all over". And she is unsure whether this is fiber or psoriatic arthritis pain She has no psoriasis at this time. She does not have any swelling or warmth of her joints when she is "having a flare of her psoriatic arthritis."  She has not missed any doses of her medications. She has failed Humira and Enbrel in the past. She also has elevated LFTs when she is used methotrexate in the past (see June 2017 note).  She is past due for labs and TB gold test  Activities of Daily Living:  Patient reports morning stiffness for 60 minutes.   Patient Reports nocturnal pain.  Difficulty dressing/grooming: Denies Difficulty climbing stairs: Reports Difficulty getting out of chair: Reports Difficulty using hands for taps, buttons, cutlery, and/or writing: Reports   Review of Systems  Constitutional: Positive for fatigue.  HENT: Negative for mouth sores and mouth dryness.   Eyes: Negative for dryness.  Respiratory: Negative for shortness of breath.   Gastrointestinal: Negative for constipation and diarrhea.  Musculoskeletal: Positive for  myalgias and myalgias.  Skin: Negative for sensitivity to sunlight.  Psychiatric/Behavioral: Positive for sleep disturbance. Negative for decreased concentration.    PMFS History:  Patient Active Problem List   Diagnosis Date Noted  . Psoriatic arthropathy ([place]) 07/06/2016  . Other psoriasis 07/06/2016  . High risk medications (not anticoagulants) long-term use 07/06/2016  . Fibromyalgia 07/06/2016  . Chronic fatigue 07/06/2016  . Primary insomnia 07/06/2016    Past Medical History:  Diagnosis Date  . Arthritis   . Cataract   . Diabetes mellitus without complication ([place])   . Glaucoma   . Hypertension     Family History  Problem Relation Age of Onset  . Cancer Sister   . Diabetes Sister   . COPD Brother   . Diabetes Brother    Past Surgical History:  Procedure Laterality Date  . ABDOMINAL HYSTERECTOMY    . APPENDECTOMY    . CARPAL TUNNEL RELEASE    . CHOLECYSTECTOMY    . FEMUR FRACTURE SURGERY    . REPLACEMENT TOTAL KNEE BILATERAL    . TOTAL SHOULDER ARTHROPLASTY    . TRIGGER FINGER RELEASE     Social History   Social History Narrative  . No narrative on file     Objective: Vital Signs: BP 138/78   Pulse 78   Resp 16   Wt 249 lb (112.9 kg)   BMI 39.00 kg/m  Physical Exam  Constitutional: She is oriented to person, place, and time. She appears well-developed and well-nourished.  HENT:  Head: Normocephalic and atraumatic.  Eyes: EOM are normal. Pupils are equal, round, and reactive to light.  Cardiovascular: Normal rate, regular rhythm and normal heart sounds.  Exam reveals no gallop and no friction rub.   No murmur heard. Pulmonary/Chest: Effort normal and breath sounds normal. She has no wheezes. She has no rales.  Abdominal: Soft. Bowel sounds are normal. She exhibits no distension. There is no tenderness. There is no guarding. No hernia.  Musculoskeletal: Normal range of motion. She exhibits no edema, tenderness or deformity.  Lymphadenopathy:     She has no cervical adenopathy.  Neurological: She is alert and oriented to person, place, and time. Coordination normal.  Skin: Skin is warm and dry. Capillary refill takes less than 2 seconds. No rash noted.  Psychiatric: She has a normal mood and affect. Her behavior is normal.  Nursing note and vitals reviewed.    Musculoskeletal Exam:  Full range of motion of all joints Grip strength is equal and strong bilaterally except patient has had trouble with her right hand after having surgery of tendon release. Fibromyalgia tender points are 18 out of 18 positive  CDAI Exam: CDAI Homunculus Exam:   Joint Counts:  CDAI Tender Joint count: 0 CDAI Swollen Joint count: 0  Global Assessments:  Patient Global Assessment: 7 Provider Global Assessment: 7  CDAI Calculated Score: 14    Investigation: Findings:  05/18/2015 negative TB gold   CBC Latest Ref Rng & Units 02/03/2016 01/10/2010 01/09/2010  WBC 3.8 - 10.8 K/uL 8.4 9.4 12.1(H)  Hemoglobin 11.7 - 15.5 g/dL 14.0 7.4(L) 7.7 DELTA CHECK NOTED REPEATED TO VERIFY(L)  Hematocrit 35.0 - 45.0 % 43.7 23.0(L) 24.4(L)  Platelets 140 - 400 K/uL 249 156 153   CMP Latest Ref Rng & Units 02/03/2016 01/10/2010 01/09/2010  Glucose 65 - 99 mg/dL 236(H) 178(H) 78  BUN 7 - 25 mg/dL '16 9 8  ' Creatinine 0.50 - 0.99 mg/dL 0.88 0.61 0.72  Sodium 135 - 146 mmol/L 140 135 141  Potassium 3.5 - 5.3 mmol/L 4.9 4.6 4.2  Chloride 98 - 110 mmol/L 101 103 107  CO2 20 - 31 mmol/L '31 26 30  ' Calcium 8.6 - 10.4 mg/dL 9.1 8.1(L) 8.0(L)  Total Protein 6.1 - 8.1 g/dL 6.3 4.8(L) -  Total Bilirubin 0.2 - 1.2 mg/dL 0.4 0.7 -  Alkaline Phos 33 - 130 U/L 57 56 -  AST 10 - 35 U/L 12 15 -  ALT 6 - 29 U/L 10 25 -   Orders Only on 02/03/2016  Component Date Value Ref Range Status  . WBC 02/03/2016 8.4  3.8 - 10.8 K/uL Final  . RBC 02/03/2016 4.91  3.80 - 5.10 MIL/uL Final  . Hemoglobin 02/03/2016 14.0  11.7 - 15.5 g/dL Final  . HCT 02/03/2016 43.7  35.0 - 45.0 %  Final  . MCV 02/03/2016 89.0  80.0 - 100.0 fL Final  . MCH 02/03/2016 28.5  27.0 - 33.0 pg Final  . MCHC 02/03/2016 32.0  32.0 - 36.0 g/dL Final  . RDW 02/03/2016 13.2  11.0 - 15.0 % Final  . Platelets 02/03/2016 249  140 - 400 K/uL Final  . MPV 02/03/2016 10.3  7.5 - 12.5 fL Final  . Neutro Abs 02/03/2016 4368  1,500 - 7,800 cells/uL Final  . Lymphs Abs 02/03/2016 3360  850 - 3,900 cells/uL Final  . Monocytes Absolute 02/03/2016  504  200 - 950 cells/uL Final  . Eosinophils Absolute 02/03/2016 168  15 - 500 cells/uL Final  . Basophils Absolute 02/03/2016 0  0 - 200 cells/uL Final  . Neutrophils Relative % 02/03/2016 52  % Final  . Lymphocytes Relative 02/03/2016 40  % Final  . Monocytes Relative 02/03/2016 6  % Final  . Eosinophils Relative 02/03/2016 2  % Final  . Basophils Relative 02/03/2016 0  % Final  . Smear Review 02/03/2016 Criteria for review not met   Final  . Sodium 02/03/2016 140  135 - 146 mmol/L Final  . Potassium 02/03/2016 4.9  3.5 - 5.3 mmol/L Final  . Chloride 02/03/2016 101  98 - 110 mmol/L Final  . CO2 02/03/2016 31  20 - 31 mmol/L Final  . Glucose, Bld 02/03/2016 236* 65 - 99 mg/dL Final  . BUN 02/03/2016 16  7 - 25 mg/dL Final  . Creat 02/03/2016 0.88  0.50 - 0.99 mg/dL Final   Comment:   For patients > or = 66 years of age: The upper reference limit for Creatinine is approximately 13% higher for people identified as African-American.     . Total Bilirubin 02/03/2016 0.4  0.2 - 1.2 mg/dL Final  . Alkaline Phosphatase 02/03/2016 57  33 - 130 U/L Final  . AST 02/03/2016 12  10 - 35 U/L Final  . ALT 02/03/2016 10  6 - 29 U/L Final  . Total Protein 02/03/2016 6.3  6.1 - 8.1 g/dL Final  . Albumin 02/03/2016 3.5* 3.6 - 5.1 g/dL Final  . Calcium 02/03/2016 9.1  8.6 - 10.4 mg/dL Final  . GFR, Est African American 02/03/2016 80  >=60 mL/min Final  . GFR, Est Non African American 02/03/2016 69  >=60 mL/min Final     Imaging: No results found.  Speciality  Comments: No specialty comments available.    Procedures:  No procedures performed Allergies: Aspirin; Bee venom; Cephalosporins; Clarithromycin; Codeine; Demerol; E-mycin [erythromycin base]; Etodolac; Gatifloxacin; Iodinated diagnostic agents; Levaquin  [levofloxacin in d5w]; Levofloxacin; Levofloxacin; Meperidine; Morphine and related; Nsaids; Other; Phenothiazines; Shellfish-derived products; Tequin; Penicillins; and Sulfa drugs cross reactors   Assessment / Plan:     Visit Diagnoses: Psoriatic arthropathy ([place])  Primary insomnia  High risk medications (not anticoagulants) long-term use - Plan: CBC with Differential/Platelet, COMPLETE METABOLIC PANEL WITH GFR, Quantiferon tb gold assay (blood)  Other psoriasis  Fibromyalgia  Chronic fatigue   Plan: #1: Psoriatic arthritis. Patient started noticing about 4 months ago increased joint pain. She is on Cosentyx 300 mg every 30 days. Patient states that she used to get full relief with the Cosentyx. Over the last 4 months, she's been noticing gradual increase in her discomfort. Namely, she states that when she gets her injections that first week she feels fine. By the second week, she is having some pain and describes it as a 7 By the third week, the pain is increased even more and describes it as a 8 By the fourth week, her pain is rated significantly high. And describes it as a 9 Note: She is usually in pain on a regular basis and she learned a tolerated. Her baseline is a 6. On a scale of 0-10.  #2: High-risk prescription Cosentyx 300 mg every 30 days (started 09/27/2015, first injection). Patient did really well initially. Lately she struggling with pain. See above for details (Note: Patient has failed Humira; then Enbrel; and then we switched to Cosentyx; patient's LFTs increase with methotrexate noted on June  2017 notes.; patient is allergic to sulfa)  #3: Right knee stiffness. Unfortunately, patient was told by her surgeon  that nothing can be done for the right knee  #4: Fibromyalgia. Patient lives in continuous pain and she rates her discomfort as a 6 on average.  #5: CBC with differential, CMP with GFR, TB gold due today. ( I reviewed her labs in Epic. Last labs that we have for the patient is December 2017 for the CBC and the CMP; TB gold is from March 2017)   Orders: Orders Placed This Encounter  Procedures  . CBC with Differential/Platelet  . COMPLETE METABOLIC PANEL WITH GFR  . Quantiferon tb gold assay (blood)   No orders of the defined types were placed in this encounter.   Face-to-face time spent with patient was 40 minutes. 50% of time was spent in counseling and coordination of care.  Follow-Up Instructions: Return in about 3 months (around 10/18/2016).   Eliezer Lofts, PA-C  Note - This record has been created using [employer].  Chart creation errors have been sought, but may not always  have been located. Such creation errors do not reflect on  the standard of medical care.

## 2016-07-18 ENCOUNTER — Ambulatory Visit (INDEPENDENT_AMBULATORY_CARE_PROVIDER_SITE_OTHER): Payer: Medicare Other | Admitting: Rheumatology

## 2016-07-18 ENCOUNTER — Encounter: Payer: Self-pay | Admitting: Rheumatology

## 2016-07-18 VITALS — BP 138/78 | HR 78 | Resp 16 | Wt 249.0 lb

## 2016-07-18 DIAGNOSIS — Z79899 Other long term (current) drug therapy: Secondary | ICD-10-CM

## 2016-07-18 DIAGNOSIS — M797 Fibromyalgia: Secondary | ICD-10-CM | POA: Diagnosis not present

## 2016-07-18 DIAGNOSIS — L4050 Arthropathic psoriasis, unspecified: Secondary | ICD-10-CM | POA: Diagnosis not present

## 2016-07-18 DIAGNOSIS — L408 Other psoriasis: Secondary | ICD-10-CM | POA: Diagnosis not present

## 2016-07-18 DIAGNOSIS — R5382 Chronic fatigue, unspecified: Secondary | ICD-10-CM

## 2016-07-18 DIAGNOSIS — F5101 Primary insomnia: Secondary | ICD-10-CM | POA: Diagnosis not present

## 2016-07-18 LAB — COMPLETE METABOLIC PANEL WITH GFR
ALT: 25 U/L (ref 6–29)
AST: 31 U/L (ref 10–35)
Albumin: 3.8 g/dL (ref 3.6–5.1)
Alkaline Phosphatase: 70 U/L (ref 33–130)
BUN: 19 mg/dL (ref 7–25)
CHLORIDE: 97 mmol/L — AB (ref 98–110)
CO2: 25 mmol/L (ref 20–31)
Calcium: 9.4 mg/dL (ref 8.6–10.4)
Creat: 1 mg/dL — ABNORMAL HIGH (ref 0.50–0.99)
GFR, EST AFRICAN AMERICAN: 68 mL/min (ref >=60)
GFR, EST NON AFRICAN AMERICAN: 59 mL/min — AB (ref >=60)
Glucose, Bld: 291 mg/dL — ABNORMAL HIGH (ref 65–99)
Potassium: 5.1 mmol/L (ref 3.5–5.3)
Sodium: 135 mmol/L (ref 135–146)
Total Bilirubin: 0.6 mg/dL (ref 0.2–1.2)
Total Protein: 6.9 g/dL (ref 6.1–8.1)

## 2016-07-18 LAB — CBC WITH DIFFERENTIAL/PLATELET
BASOS PCT: 0 %
Basophils Absolute: 0 cells/uL (ref 0–200)
EOS ABS: 117 {cells}/uL (ref 15–500)
Eosinophils Relative: 1 %
HCT: 43.7 % (ref 35.0–45.0)
Hemoglobin: 14.3 g/dL (ref 11.7–15.5)
LYMPHS PCT: 31 %
Lymphs Abs: 3627 cells/uL (ref 850–3900)
MCH: 29.2 pg (ref 27.0–33.0)
MCHC: 32.7 g/dL (ref 32.0–36.0)
MCV: 89.2 fL (ref 80.0–100.0)
MONOS PCT: 7 %
MPV: 10.5 fL (ref 7.5–12.5)
Monocytes Absolute: 819 cells/uL (ref 200–950)
Neutro Abs: 7137 cells/uL (ref 1500–7800)
Neutrophils Relative %: 61 %
PLATELETS: 273 10*3/uL (ref 140–400)
RBC: 4.9 MIL/uL (ref 3.80–5.10)
RDW: 13 % (ref 11.0–15.0)
WBC: 11.7 10*3/uL — AB (ref 3.8–10.8)

## 2016-07-20 LAB — QUANTIFERON TB GOLD ASSAY (BLOOD)
Interferon Gamma Release Assay: NEGATIVE
QUANTIFERON NIL VALUE: 0.03 [IU]/mL
QUANTIFERON TB AG MINUS NIL: 0 [IU]/mL

## 2016-08-08 ENCOUNTER — Other Ambulatory Visit: Payer: Self-pay | Admitting: Rheumatology

## 2016-08-08 NOTE — Telephone Encounter (Signed)
ok 

## 2016-08-08 NOTE — Telephone Encounter (Signed)
Patient needs a new RX sent into [facility] for her Cosentex. Please call patient to advise.

## 2016-08-08 NOTE — Telephone Encounter (Signed)
Last Visit: 07/18/16 Next Visit: 10/10/16 Labs: 07/18/16 WBC 11.7 and elevated glucose TB Gold: 07/18/16 Neg  Okay to refill Cosentyx?

## 2016-08-09 MED ORDER — SECUKINUMAB 150 MG/ML ~~LOC~~ SOAJ: 300.0000 mg | SUBCUTANEOUS | 0 refills | Status: DC

## 2016-08-09 NOTE — Telephone Encounter (Signed)
Prescription has been sent to pharmacy. Patient notified.

## 2016-08-14 ENCOUNTER — Telehealth: Payer: Self-pay | Admitting: Rheumatology

## 2016-08-14 NOTE — Telephone Encounter (Signed)
Patient left a message this morning requesting that we send in her refill for her Cosentyx.  I looked on her chart and did not see this medication.  CB#[phone].  Thank you.

## 2016-08-15 MED ORDER — SECUKINUMAB 150 MG/ML ~~LOC~~ SOAJ: 300.0000 mg | SUBCUTANEOUS | 0 refills | Status: DC

## 2016-08-15 NOTE — Telephone Encounter (Signed)
Prescription has been resent

## 2016-08-15 NOTE — Telephone Encounter (Signed)
Received fax from Pharmacy Services at [facility]. "Wrong Pharmacy! This pharmacy is for [name] only.  Please send this rx to the [facility] Patient Assistance Program."    I called [employer] patient assistance program and spoke to [place].  I provided verbal prescription for Cosentyx 300 mg subcutaneous every 30 days, quantity 2 pens for 30 day supply with 2 refills.  She said she will process prescription as stat and patient should get a call tomorrow to schedule delivery.    I updated patient to update her.  She confirms she is out of medication and due for an injection.  I offered her a sample if needed.  Patient said she will wait to see if they call her tomorrow, but if she does not hear from them she may call us back for a sample.   [name], Pharm.D., BCPS, CPP Clinical Pharmacist Pager: [phone] Phone: [phone] 08/15/2016 4:15 PM

## 2016-10-09 ENCOUNTER — Telehealth: Payer: Self-pay | Admitting: Rheumatology

## 2016-10-09 NOTE — Telephone Encounter (Signed)
Patient will need to have an ultrasound scheduled

## 2016-10-09 NOTE — Telephone Encounter (Signed)
Patient needs to rs appt for Wednesday. Question if patient needs an [place]? Two appts were made at last appt on 07/18/16, but appt note did not state [place], nor does last office visit note. Please advise.

## 2016-10-10 ENCOUNTER — Ambulatory Visit: Payer: Medicare Other | Admitting: Rheumatology

## 2016-10-15 NOTE — Progress Notes (Deleted)
Office Visit Note  Patient: [name]             Date of Birth: [birth year]           MRN: [id number]             PCP: Jamal Collin, PA-C Referring: Jamal Collin, PA-C Visit Date: 10/18/2016 Occupation: @GUAROCC @    Subjective:  No chief complaint on file.   History of Present Illness: [name] is a 66 y.o. female ***   Activities of Daily Living:  Patient reports morning stiffness for *** {minute/hour:19697}.   Patient {ACTIONS;DENIES/REPORTS:21021675::"Denies"} nocturnal pain.  Difficulty dressing/grooming: {ACTIONS;DENIES/REPORTS:21021675::"Denies"} Difficulty climbing stairs: {ACTIONS;DENIES/REPORTS:21021675::"Denies"} Difficulty getting out of chair: {ACTIONS;DENIES/REPORTS:21021675::"Denies"} Difficulty using hands for taps, buttons, cutlery, and/or writing: {ACTIONS;DENIES/REPORTS:21021675::"Denies"}   No Rheumatology ROS completed.   PMFS History:  Patient Active Problem List   Diagnosis Date Noted  . Psoriatic arthropathy ([facility]) 07/06/2016  . Other psoriasis 07/06/2016  . High risk medications (not anticoagulants) long-term use 07/06/2016  . Fibromyalgia 07/06/2016  . Chronic fatigue 07/06/2016  . Primary insomnia 07/06/2016    Past Medical History:  Diagnosis Date  . Arthritis   . Cataract   . Diabetes mellitus without complication ([facility])   . Glaucoma   . Hypertension     Family History  Problem Relation Age of Onset  . Cancer Sister   . Diabetes Sister   . COPD Brother   . Diabetes Brother    Past Surgical History:  Procedure Laterality Date  . ABDOMINAL HYSTERECTOMY    . APPENDECTOMY    . CARPAL TUNNEL RELEASE    . CHOLECYSTECTOMY    . FEMUR FRACTURE SURGERY    . REPLACEMENT TOTAL KNEE BILATERAL    . TOTAL SHOULDER ARTHROPLASTY    . TRIGGER FINGER RELEASE     Social History   Social History Narrative  . No narrative on file     Objective: Vital Signs: There were no vitals taken for this visit.   Physical Exam     Musculoskeletal Exam: ***  CDAI Exam: No CDAI exam completed.    Investigation: Findings:  07/18/2016 negative TB gold   CBC Latest Ref Rng & Units 07/18/2016 02/03/2016 01/10/2010  WBC 3.8 - 10.8 K/uL 11.7(H) 8.4 9.4  Hemoglobin 11.7 - 15.5 g/dL 85.6 31.4 9.7(W)  Hematocrit 35.0 - 45.0 % 43.7 43.7 23.0(L)  Platelets 140 - 400 K/uL 273 249 156    CMP Latest Ref Rng & Units 07/18/2016 02/03/2016 01/10/2010  Glucose 65 - 99 mg/dL 263(Z) 858(I) 502(D)  BUN 7 - 25 mg/dL 19 16 9   Creatinine 0.50 - 0.99 mg/dL 7.41(O) 8.78 6.76  Sodium 135 - 146 mmol/L 135 140 135  Potassium 3.5 - 5.3 mmol/L 5.1 4.9 4.6  Chloride 98 - 110 mmol/L 97(L) 101 103  CO2 20 - 31 mmol/L 25 31 26   Calcium 8.6 - 10.4 mg/dL 9.4 9.1 7.2(C)  Total Protein 6.1 - 8.1 g/dL 6.9 6.3 9.4(B)  Total Bilirubin 0.2 - 1.2 mg/dL 0.6 0.4 0.7  Alkaline Phos 33 - 130 U/L 70 57 56  AST 10 - 35 U/L 31 12 15   ALT 6 - 29 U/L 25 10 25     Imaging: No results found.  Speciality Comments: No specialty comments available.    Procedures:  No procedures performed Allergies: Aspirin; Bee venom; Cephalosporins; Clarithromycin; Codeine; Demerol; E-mycin [erythromycin base]; Etodolac; Gatifloxacin; Iodinated diagnostic agents; Levaquin  [levofloxacin in d5w]; Levofloxacin; Levofloxacin; Meperidine; Morphine and related; Nsaids; Other; Phenothiazines;  Shellfish-derived products; Tequin; Penicillins; and Sulfa drugs cross reactors   Assessment / Plan:     Visit Diagnoses: Psoriatic arthropathy ([facility])  Other psoriasis  High risk medications long-term use - Cosentyx  Fibromyalgia  Chronic fatigue  Primary insomnia    Orders: No orders of the defined types were placed in this encounter.  No orders of the defined types were placed in this encounter.   Face-to-face time spent with patient was *** minutes. 50% of time was spent in counseling and coordination of care.  Follow-Up Instructions: No Follow-up on file.   Amy  Littrell, RT  Note - This record has been created using [employer].  Chart creation errors have been sought, but may not always  have been located. Such creation errors do not reflect on  the standard of medical care.

## 2016-10-16 DIAGNOSIS — Z8639 Personal history of other endocrine, nutritional and metabolic disease: Secondary | ICD-10-CM | POA: Insufficient documentation

## 2016-10-16 DIAGNOSIS — Z96653 Presence of artificial knee joint, bilateral: Secondary | ICD-10-CM | POA: Insufficient documentation

## 2016-10-16 DIAGNOSIS — E785 Hyperlipidemia, unspecified: Secondary | ICD-10-CM | POA: Insufficient documentation

## 2016-10-16 DIAGNOSIS — G8929 Other chronic pain: Secondary | ICD-10-CM | POA: Insufficient documentation

## 2016-10-16 DIAGNOSIS — Z8669 Personal history of other diseases of the nervous system and sense organs: Secondary | ICD-10-CM | POA: Insufficient documentation

## 2016-10-16 DIAGNOSIS — M19041 Primary osteoarthritis, right hand: Secondary | ICD-10-CM | POA: Insufficient documentation

## 2016-10-16 DIAGNOSIS — M19042 Primary osteoarthritis, left hand: Secondary | ICD-10-CM

## 2016-10-18 ENCOUNTER — Ambulatory Visit: Payer: Medicare Other | Admitting: Rheumatology

## 2016-10-30 ENCOUNTER — Telehealth: Payer: Self-pay | Admitting: Rheumatology

## 2016-10-30 NOTE — Telephone Encounter (Signed)
[facility] is [facility]

## 2016-11-01 NOTE — Telephone Encounter (Signed)
[facility] by [facility].

## 2016-11-13 ENCOUNTER — Telehealth: Payer: Self-pay | Admitting: Rheumatology

## 2016-11-13 NOTE — Telephone Encounter (Signed)
Patient advised that the clarification had been faxed to [facility]. Patient advised tried to contact them to do the clarification verbally and their system was going through an update and they were unable to take it at this time.

## 2016-11-13 NOTE — Telephone Encounter (Signed)
Patient left a message requesting someone from our office call [facility] concerning a question they have about patients Cosentyx.

## 2016-11-25 NOTE — Progress Notes (Signed)
Office Visit Note  Patient: [name]             Date of Birth: 11/24/1950           MRN: [id number]             PCP: Jamal Collin, PA-C Referring: Jamal Collin, PA-C Visit Date: 12/05/2016 Occupation: @    Subjective:  Pain in multiple joints.   History of Present Illness: [name] is a 66 y.o. female with history of psoriatic arthritis, fibromyalgia and osteoarthritis. She states she continues to have generalized pain and discomfort. She has noticed improvement on Cosyntex. She states she does have some discomfort prior to her next dose of Cosyntex. She has underlying osteoarthritis in her knee joints which continue to hurt. She has noticed some swelling in her knee joints. For fibromyalgia she started Lyrica last visit. That is been helpful in reducing her pain symptoms. She still has psoriasis on her scalp and her elbows which has improved since she started Cosyntex.  Activities of Daily Living:  Patient reports morning stiffness for 60 minutes.   Patient Denies nocturnal pain.  Difficulty dressing/grooming: Denies Difficulty climbing stairs: Denies Difficulty getting out of chair: Reports Difficulty using hands for taps, buttons, cutlery, and/or writing: Reports   Review of Systems  Constitutional: Positive for fatigue. Negative for night sweats, weight gain, weight loss and weakness.  HENT: Positive for mouth dryness. Negative for mouth sores, trouble swallowing, trouble swallowing and nose dryness.   Eyes: Positive for dryness. Negative for pain, redness and visual disturbance.  Respiratory: Negative.  Negative for cough, shortness of breath and difficulty breathing.   Cardiovascular: Positive for swelling in legs/feet. Negative for chest pain, palpitations, hypertension and irregular heartbeat.  Gastrointestinal: Negative.  Negative for blood in stool, constipation and diarrhea.  Endocrine: Negative for increased urination.  Genitourinary:  Negative for vaginal dryness.  Musculoskeletal: Positive for arthralgias, joint pain, joint swelling, morning stiffness and muscle tenderness. Negative for myalgias, muscle weakness and myalgias.  Skin: Negative.  Negative for color change, rash, hair loss, skin tightness, ulcers and sensitivity to sunlight.  Allergic/Immunologic: Negative for susceptible to infections.  Neurological: Negative for dizziness, numbness, headaches, memory loss and night sweats.  Hematological: Negative for swollen glands.  Psychiatric/Behavioral: Positive for sleep disturbance. Negative for depressed mood. The patient is not nervous/anxious.     PMFS History:  Patient Active Problem List   Diagnosis Date Noted  . Dyslipidemia 10/16/2016  . History of total knee replacement, bilateral 10/16/2016  . Primary osteoarthritis of both hands 10/16/2016  . History of diabetes mellitus 10/16/2016  . Other chronic pain 10/16/2016  . History of glaucoma 10/16/2016  . Psoriatic arthropathy (HCC) 07/06/2016  . Other psoriasis 07/06/2016  . High risk medications (not anticoagulants) long-term use 07/06/2016  . Fibromyalgia 07/06/2016  . Chronic fatigue 07/06/2016  . Primary insomnia 07/06/2016    Past Medical History:  Diagnosis Date  . Arthritis   . Cataract   . Diabetes mellitus without complication (HCC)   . Glaucoma   . Hypertension     Family History  Problem Relation Age of Onset  . Cancer Sister   . Diabetes Sister   . COPD Brother   . Diabetes Brother    Past Surgical History:  Procedure Laterality Date  . ABDOMINAL HYSTERECTOMY    . APPENDECTOMY    . CARPAL TUNNEL RELEASE    . CHOLECYSTECTOMY    . FEMUR FRACTURE SURGERY    .  REPLACEMENT TOTAL KNEE BILATERAL    . TOTAL SHOULDER ARTHROPLASTY    . TRIGGER FINGER RELEASE     Social History   Social History Narrative  . No narrative on file     Objective: Vital Signs: BP 121/62 (BP Location: Left Arm, Patient Position: Sitting, Cuff Size:  Normal)   Pulse 84   Ht  (1.676 m)   Wt 258 lb (117 kg)   BMI 41.64 kg/m    Physical Exam  Constitutional: She is oriented to person, place, and time. She appears well-developed and well-nourished.  HENT:  Head: Normocephalic and atraumatic.  Eyes: Conjunctivae and EOM are normal.  Neck: Normal range of motion.  Cardiovascular: Normal rate, regular rhythm, normal heart sounds and intact distal pulses.   Pulmonary/Chest: Effort normal and breath sounds normal.  Abdominal: Soft. Bowel sounds are normal.  Lymphadenopathy:    She has no cervical adenopathy.  Neurological: She is alert and oriented to person, place, and time.  Skin: Skin is warm and dry. Capillary refill takes less than 2 seconds. Rash noted.  Psoriasis patches on scalp and right elbow  Psychiatric: She has a normal mood and affect. Her behavior is normal.  Nursing note and vitals reviewed.    Musculoskeletal Exam: C-spine and thoracic lumbar spine limited range of motion. She has thoracic kyphosis. Shoulder joints elbow joints wrist joints with good range of motion. She had no synovitis of her MCPs PIPs or DIPs. Hip joints were difficult to assess as she was examined on the chair. She painful range of motion of bilateral knee joints which are replaced. She had no synovitis or ankle joints or MTP joints. She generalize hyperalgesia from fibromyalgia.  CDAI Exam: CDAI Homunculus Exam:   Tenderness:  RLE: tibiofemoral  Joint Counts:  CDAI Tender Joint count: 1 CDAI Swollen Joint count: 0  Global Assessments:  Patient Global Assessment: 5 Provider Global Assessment: 1  CDAI Calculated Score: 7    Investigation: No additional findings.TB Gold: Negative in 06/2016 CBC Latest Ref Rng & Units 07/18/2016 02/03/2016 01/10/2010  WBC 3.8 - 10.8 K/uL 11.7(H) 8.4 9.4  Hemoglobin 11.7 - 15.5 g/dL 40.9 81.1 9.1(Y)  Hematocrit 35.0 - 45.0 % 43.7 43.7 23.0(L)  Platelets 140 - 400 K/uL 273 249 156   CMP Latest Ref  Rng & Units 07/18/2016 02/03/2016 01/10/2010  Glucose 65 - 99 mg/dL 782(N) 562(Z) 308(M)  BUN 7 - 25 mg/dL Creatinine 0.50 - 0.99 mg/dL 5.78(I) 6.96 2.95  Sodium 135 - 146 mmol/L 135 140 135  Potassium 3.5 - 5.3 mmol/L 5.1 4.9 4.6  Chloride 98 - 110 mmol/L 97(L) 101 103  CO2 20 - 31 mmol/L Calcium 8.6 - 10.4 mg/dL 9.4 9.1 2.8(U)  Total Protein 6.1 - 8.1 g/dL 6.9 6.3 1.3(K)  Total Bilirubin 0.2 - 1.2 mg/dL 0.6 0.4 0.7  Alkaline Phos 33 - 130 U/L 70 57 56  AST 10 - 35 U/L ALT 6 - 29 U/L Imaging: No results found.  Speciality Comments: No specialty comments available.    Procedures:  No procedures performed Allergies: Aspirin; Bee venom; Cephalosporins; Clarithromycin; Codeine; Demerol; E-mycin [erythromycin base]; Etodolac; Gatifloxacin; Iodinated diagnostic agents; Levaquin  [levofloxacin in d5w]; Levofloxacin; Levofloxacin; Meperidine; Morphine and related; Nsaids; Other; Phenothiazines; Shellfish-derived products; Tequin; Penicillins; and Sulfa drugs cross reactors   Assessment / Plan:     Visit Diagnoses: Psoriatic arthropathy (HCC)She complains of arthralgias  but has no active synovitis on examination. In my opinion she is doing much better on Cosyntex. Although she complains of arthralgias prior to her next injection.  Other psoriasis: She has mild rash on her right elbow and the scalp. Which is also improved a lot.  High risk medication use - cosentyx 300 mg sq q mthPatient has failed Humira, then Enbrel - Plan: CBC with Differential/Platelet, COMPLETE METABOLIC PANEL WITH GFR, CBC with Differential/Platelet, COMPLETE METABOLIC PANEL WITH GFR WE will check labs today and then every 3 months to monitor for drug toxicity.  Primary osteoarthritis of both hands: She has some stiffness in her hands she has DIP changes due to osteoarthritis.  History of total knee replacement, bilateral: She has chronic pain in her bilateral knee joints.  Patient requests a refill on Voltaren gel which will be given today.  Fibromyalgia: She has generalized pain and discomfort. She believes that Lyrica has been helpful.  History of femur fracture - left. DXA normal 2012. Patient states that her DXA has been by her PCP.  Her other medical problems are listed as follows:  History of hypercholesterolemia  Other insomnia  Other fatigue  History of diabetes mellitus   Obesity: Weight loss diet and exercise was discussed.   Orders: Orders Placed This Encounter  Procedures  . CBC with Differential/Platelet  . COMPLETE METABOLIC PANEL WITH GFR  . CBC with Differential/Platelet  . COMPLETE METABOLIC PANEL WITH GFR   Meds ordered this encounter  Medications  . diclofenac sodium (VOLTAREN) 1 % GEL    Sig: Apply to affected areas 3 to 4 times daily PRN    Dispense:  3 Tube    Refill:  6    Face-to-face time spent with patient was 30 minutes. Greater than 50% of time was spent in counseling and coordination of care.  Follow-Up Instructions: Return in about 5 months (around 05/05/2017) for Psoriatic arthritis, Osteoarthritis,FMS.   Pollyann Savoy, MD  Note - This record has been created using Animal nutritionist.  Chart creation errors have been sought, but may not always  have been located. Such creation errors do not reflect on  the standard of medical care.

## 2016-12-05 ENCOUNTER — Encounter: Payer: Self-pay | Admitting: Rheumatology

## 2016-12-05 ENCOUNTER — Ambulatory Visit (INDEPENDENT_AMBULATORY_CARE_PROVIDER_SITE_OTHER): Payer: Medicare Other | Admitting: Rheumatology

## 2016-12-05 VITALS — BP 121/62 | HR 84 | Ht 66.0 in | Wt 258.0 lb

## 2016-12-05 DIAGNOSIS — Z8781 Personal history of (healed) traumatic fracture: Secondary | ICD-10-CM

## 2016-12-05 DIAGNOSIS — M797 Fibromyalgia: Secondary | ICD-10-CM | POA: Diagnosis not present

## 2016-12-05 DIAGNOSIS — Z8639 Personal history of other endocrine, nutritional and metabolic disease: Secondary | ICD-10-CM | POA: Diagnosis not present

## 2016-12-05 DIAGNOSIS — G4709 Other insomnia: Secondary | ICD-10-CM | POA: Diagnosis not present

## 2016-12-05 DIAGNOSIS — Z79899 Other long term (current) drug therapy: Secondary | ICD-10-CM

## 2016-12-05 DIAGNOSIS — L408 Other psoriasis: Secondary | ICD-10-CM | POA: Diagnosis not present

## 2016-12-05 DIAGNOSIS — M19042 Primary osteoarthritis, left hand: Secondary | ICD-10-CM

## 2016-12-05 DIAGNOSIS — Z96653 Presence of artificial knee joint, bilateral: Secondary | ICD-10-CM

## 2016-12-05 DIAGNOSIS — M19041 Primary osteoarthritis, right hand: Secondary | ICD-10-CM | POA: Diagnosis not present

## 2016-12-05 DIAGNOSIS — R5383 Other fatigue: Secondary | ICD-10-CM

## 2016-12-05 DIAGNOSIS — L4050 Arthropathic psoriasis, unspecified: Secondary | ICD-10-CM

## 2016-12-05 LAB — CBC WITH DIFFERENTIAL/PLATELET
BASOS PCT: 0.3 %
Basophils Absolute: 26 cells/uL (ref 0–200)
EOS PCT: 2 %
Eosinophils Absolute: 174 cells/uL (ref 15–500)
HCT: 38.8 % (ref 35.0–45.0)
HEMOGLOBIN: 12.7 g/dL (ref 11.7–15.5)
Lymphs Abs: 3471 cells/uL (ref 850–3900)
MCH: 28.7 pg (ref 27.0–33.0)
MCHC: 32.7 g/dL (ref 32.0–36.0)
MCV: 87.6 fL (ref 80.0–100.0)
MONOS PCT: 5.8 %
MPV: 11.2 fL (ref 7.5–12.5)
NEUTROS ABS: 4524 {cells}/uL (ref 1500–7800)
Neutrophils Relative %: 52 %
PLATELETS: 244 10*3/uL (ref 140–400)
RBC: 4.43 10*6/uL (ref 3.80–5.10)
RDW: 12.2 % (ref 11.0–15.0)
TOTAL LYMPHOCYTE: 39.9 %
WBC mixed population: 505 cells/uL (ref 200–950)
WBC: 8.7 10*3/uL (ref 3.8–10.8)

## 2016-12-05 LAB — COMPLETE METABOLIC PANEL WITH GFR
AG Ratio: 1.1 (calc) (ref 1.0–2.5)
ALBUMIN MSPROF: 3.5 g/dL — AB (ref 3.6–5.1)
ALKALINE PHOSPHATASE (APISO): 82 U/L (ref 33–130)
ALT: 25 U/L (ref 6–29)
AST: 30 U/L (ref 10–35)
BILIRUBIN TOTAL: 0.4 mg/dL (ref 0.2–1.2)
BUN: 14 mg/dL (ref 7–25)
CHLORIDE: 99 mmol/L (ref 98–110)
CO2: 31 mmol/L (ref 20–32)
CREATININE: 0.82 mg/dL (ref 0.50–0.99)
Calcium: 8.6 mg/dL (ref 8.6–10.4)
GFR, EST AFRICAN AMERICAN: 86 mL/min/{1.73_m2} (ref >=60)
GFR, Est Non African American: 75 mL/min/{1.73_m2} (ref >=60)
GLUCOSE: 305 mg/dL — AB (ref 65–99)
Globulin: 3.1 g/dL (calc) (ref 1.9–3.7)
POTASSIUM: 4.4 mmol/L (ref 3.5–5.3)
Sodium: 139 mmol/L (ref 135–146)
TOTAL PROTEIN: 6.6 g/dL (ref 6.1–8.1)

## 2016-12-05 MED ORDER — DICLOFENAC SODIUM 1 % TD GEL: TRANSDERMAL | 6 refills | Status: AC

## 2016-12-05 NOTE — Progress Notes (Signed)
Glucose is high. Notify Pt. And fax results to her PCP.

## 2016-12-05 NOTE — Patient Instructions (Signed)
Standing Labs We placed an order today for your standing lab work.    Please come back and get your standing labs in January and every 3 months  We have open lab Monday through Friday from 8:30-11:30 AM and 1:30-4 PM at the office of Dr. Tamerra Merkley.   The office is located at [address], [address], [place], [place] [address] No appointment is necessary.   Labs are drawn by [facility].  You may receive a bill from [facility] for your lab work. If you have any questions regarding directions or hours of operation,  please call [phone].    

## 2017-01-15 ENCOUNTER — Telehealth: Payer: Self-pay

## 2017-01-15 NOTE — Telephone Encounter (Signed)
Received a fax from [employer] patient assistance foundation stating that pts enrollment into the program will expire on 02/18/2017. In order to avoid disruption in therapy, pts and healthcare providers must complete their portions of the application. They must be signed and dated. The re-enrollment process will begin January 2nd and applications will be processed in the order they are received. Please allow up to 6 weeks for further communication regarding pts eligibility. *If pt requires refill during re-enrollment period, call [phone] (option 1) to request a refill.   Called patient to update. We will complete the provider portion and fax to the foundation. Patient plans to come in Thursday to sign and complete application. Will update once we receive a response.   Zamauri Nez, [place], CPhT 4:23 PM

## 2017-01-17 NOTE — Telephone Encounter (Signed)
Patient came by clinic. Application was completed and signed by pt and Dr. Corliss Skainseveshwar. Application was faxed to foundation. Will update once we receive a response. Patient voices understanding and denies any questions at this time.   Will send document to scan center.   Gal Feldhaus, [place], CPhT 3:55 PM

## 2017-01-31 NOTE — Telephone Encounter (Signed)
Called [employer] to check status of patients application. Spoke with Marcelino DusterMichelle who states that they have received all documents and her application is currently in the insurance verification stage. Her application will be processed further on 02/19/17. Will update once we receive a response.   Canna Nickelson, [place], CPhT 2:00 PM

## 2017-02-05 ENCOUNTER — Telehealth: Payer: Self-pay

## 2017-02-05 MED ORDER — SECUKINUMAB 150 MG/ML ~~LOC~~ SOAJ: 300.0000 mg | SUBCUTANEOUS | 0 refills | Status: DC

## 2017-02-05 NOTE — Telephone Encounter (Signed)
Patient would like a Rx refill for Cosentyx.  Cb# is [phone].  Please advise. Thank you.

## 2017-02-05 NOTE — Addendum Note (Signed)
Addended by: Henriette CombsHATTON, Kerin Cecchi L on: 02/05/2017 02:20 PM   Modules accepted: Orders

## 2017-02-05 NOTE — Telephone Encounter (Signed)
Last Visit: 12/05/16 Next Visit: 04/03/17 Labs: 1017/18 Elevated glucose TB Gold: 07/18/16 Neg  Okay to refill per Dr. Corliss Skainseveshwar

## 2017-02-19 HISTORY — PX: TRIGGER FINGER RELEASE: SHX641

## 2017-02-20 ENCOUNTER — Ambulatory Visit (INDEPENDENT_AMBULATORY_CARE_PROVIDER_SITE_OTHER): Payer: Medicare Other | Admitting: Rheumatology

## 2017-02-20 ENCOUNTER — Inpatient Hospital Stay (INDEPENDENT_AMBULATORY_CARE_PROVIDER_SITE_OTHER): Payer: Self-pay

## 2017-02-20 ENCOUNTER — Other Ambulatory Visit: Payer: Self-pay | Admitting: *Deleted

## 2017-02-20 DIAGNOSIS — M79642 Pain in left hand: Secondary | ICD-10-CM | POA: Diagnosis not present

## 2017-02-20 DIAGNOSIS — M79641 Pain in right hand: Secondary | ICD-10-CM

## 2017-02-20 DIAGNOSIS — Z79899 Other long term (current) drug therapy: Secondary | ICD-10-CM

## 2017-02-20 LAB — CBC WITH DIFFERENTIAL/PLATELET
BASOS ABS: 39 {cells}/uL (ref 0–200)
BASOS PCT: 0.4 %
EOS ABS: 245 {cells}/uL (ref 15–500)
EOS PCT: 2.5 %
HCT: 41.4 % (ref 35.0–45.0)
Hemoglobin: 13.3 g/dL (ref 11.7–15.5)
Lymphs Abs: 3410 cells/uL (ref 850–3900)
MCH: 27.9 pg (ref 27.0–33.0)
MCHC: 32.1 g/dL (ref 32.0–36.0)
MCV: 87 fL (ref 80.0–100.0)
MONOS PCT: 5.9 %
MPV: 10.6 fL (ref 7.5–12.5)
NEUTROS ABS: 5527 {cells}/uL (ref 1500–7800)
Neutrophils Relative %: 56.4 %
PLATELETS: 285 10*3/uL (ref 140–400)
RBC: 4.76 10*6/uL (ref 3.80–5.10)
RDW: 12.7 % (ref 11.0–15.0)
TOTAL LYMPHOCYTE: 34.8 %
WBC mixed population: 578 cells/uL (ref 200–950)
WBC: 9.8 10*3/uL (ref 3.8–10.8)

## 2017-02-20 LAB — COMPLETE METABOLIC PANEL WITH GFR
AG RATIO: 1.3 (calc) (ref 1.0–2.5)
ALKALINE PHOSPHATASE (APISO): 78 U/L (ref 33–130)
ALT: 20 U/L (ref 6–29)
AST: 22 U/L (ref 10–35)
Albumin: 3.8 g/dL (ref 3.6–5.1)
BILIRUBIN TOTAL: 0.5 mg/dL (ref 0.2–1.2)
BUN: 17 mg/dL (ref 7–25)
CALCIUM: 8.7 mg/dL (ref 8.6–10.4)
CO2: 31 mmol/L (ref 20–32)
Chloride: 100 mmol/L (ref 98–110)
Creat: 0.75 mg/dL (ref 0.50–0.99)
GFR, EST NON AFRICAN AMERICAN: 83 mL/min/{1.73_m2} (ref >=60)
GFR, Est African American: 96 mL/min/{1.73_m2} (ref >=60)
GLOBULIN: 2.9 g/dL (ref 1.9–3.7)
Glucose, Bld: 188 mg/dL — ABNORMAL HIGH (ref 65–99)
POTASSIUM: 4.4 mmol/L (ref 3.5–5.3)
SODIUM: 139 mmol/L (ref 135–146)
Total Protein: 6.7 g/dL (ref 6.1–8.1)

## 2017-02-21 NOTE — Progress Notes (Signed)
stable °

## 2017-03-07 ENCOUNTER — Telehealth: Payer: Self-pay | Admitting: Rheumatology

## 2017-03-07 NOTE — Telephone Encounter (Signed)
Nicolette from [employer] left a voicemail needing prior authorization for Cosentyx.  She left two phone numbers one for [facility] #[phone] and one for her office [phone]

## 2017-03-07 NOTE — Telephone Encounter (Signed)
Returned call to [employer]. Spoke with HillsboroughLuis who states pt is missing a prior Geographical information systems officerauth approval for insurance with application. Pt has approval letter on file that is good through 03/17/2018. Letter was faxed to foundation with pts contact ID: [id number]  Will update once we receive a response.   [name], [place], CPhT 4:29 PM

## 2017-03-11 NOTE — Telephone Encounter (Signed)
Called [employer] to check the status of app. Representative states that the program is processing insurance. We should have a response within 24 to 48 hours.   Called pt to update. Left voice message.  Jowan Skillin, [place], CPhT 3:34 PM

## 2017-03-11 NOTE — Telephone Encounter (Signed)
Patient returned call. She states that [employer] called her and will be mailing out her shipment tomorrow. Will send document to scan center once received.  Shanoah Asbill, [place], CPhT 3:44 PM

## 2017-04-03 ENCOUNTER — Other Ambulatory Visit: Payer: Medicare Other | Admitting: Rheumatology

## 2017-04-15 IMAGING — NM NM BONE 3 PHASE
10 series · 20 of 20 positions shown · non-contrast
Comparison: 01/07/2010

CLINICAL DATA: Right knee pain. Status post total knee arthroplasty
in 3553.

EXAM:
NUCLEAR MEDICINE 3-PHASE BONE SCAN
TECHNIQUE: Radionuclide angiographic images, immediate static blood pool
images, and 3-hour delayed static images were obtained of the knees
after intravenous injection of radiopharmaceutical.
RADIOPHARMACEUTICALS:  26.1 mCi Wc-AAm MDP

[Series 1: blood pool · 2.07mm/px · 1 of 1 slices shown (1 of 6)]
[im 1/1]
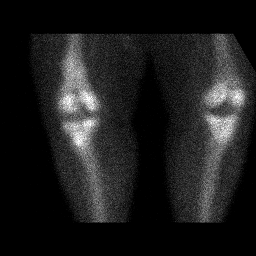

[Series 1: blood pool · 2.07mm/px · 1 of 1 slices shown (2 of 6)]
[im 1/1]
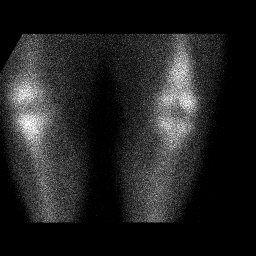

[Series 1: flow · 2.07mm/px · 6 of 48 frames shown (1 of 2)]
[frame 5/48  full-range]
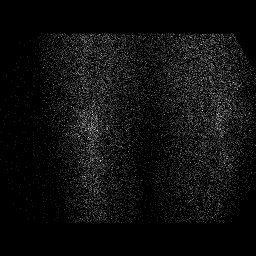
[frame 13/48  full-range]
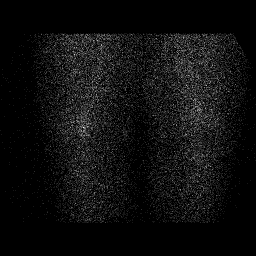
[frame 21/48  full-range]
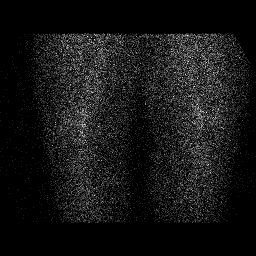
[frame 29/48  full-range]
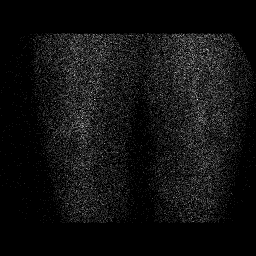
[frame 37/48  full-range]
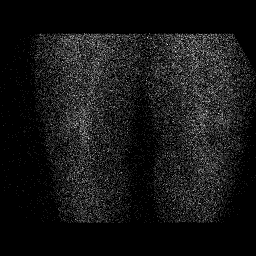
[frame 45/48  full-range]
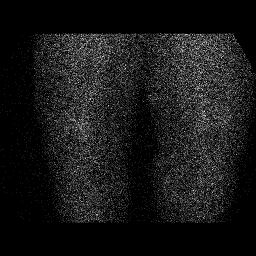

[Series 1: flow · 2.07mm/px · 6 of 48 frames shown (2 of 2)]
[frame 5/48  full-range]
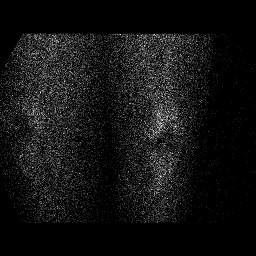
[frame 13/48  full-range]
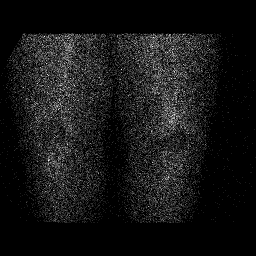
[frame 21/48  full-range]
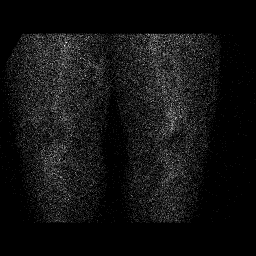
[frame 29/48  full-range]
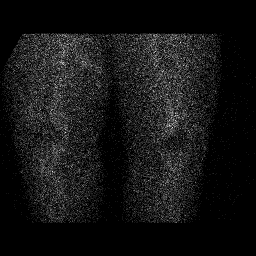
[frame 37/48  full-range]
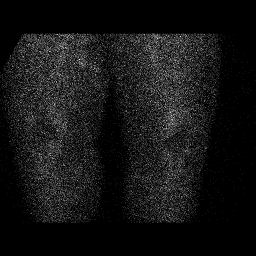
[frame 45/48  full-range]
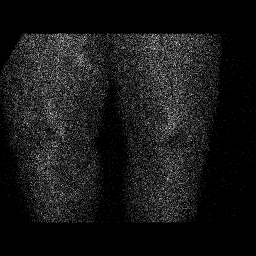

[Series 2: blood pool · 2.07mm/px · 1 of 1 slices shown (3 of 6)]
[im 1/1]
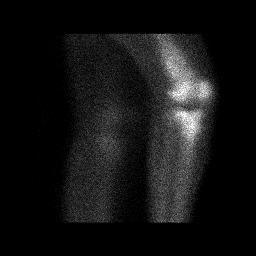

[Series 2: blood pool · 2.07mm/px · 1 of 1 slices shown (4 of 6)]
[im 1/1  full-range]
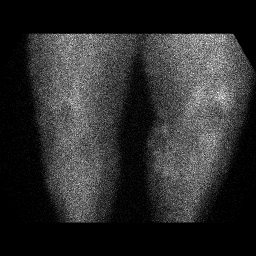

[Series 2: blood pool · 2.07mm/px · 1 of 1 slices shown (5 of 6)]
[im 1/1]
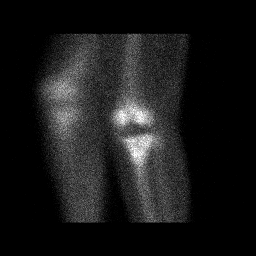

[Series 2: blood pool · 2.07mm/px · 1 of 1 slices shown (6 of 6)]
[im 1/1  full-range]
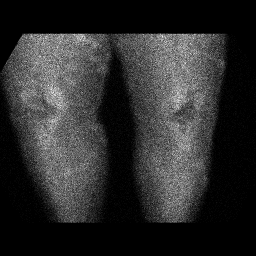

[Series 3: lat bp · 2.07mm/px · 1 of 1 slices shown (1 of 2)]
[im 1/1  full-range]
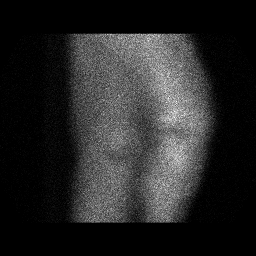

[Series 3: lat bp · 2.07mm/px · 1 of 1 slices shown (2 of 2)]
[im 1/1  full-range]
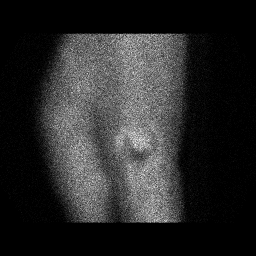

[20 of 20 positions shown; findings below may reference images not displayed]

FINDINGS: Vascular phase: Symmetric radiotracer uptake to both knees.

Blood pool phase: Symmetric radiotracer uptake to both knees.

Delayed phase: Symmetric radiotracer uptake to both knees noted.
IMPRESSION: 1. No significant asymmetry on the vascular, blood pool or delayed
phase images to indicate arthroplasty device loosening or infection.

## 2017-04-16 ENCOUNTER — Telehealth: Payer: Self-pay | Admitting: Rheumatology

## 2017-04-16 MED ORDER — SECUKINUMAB 150 MG/ML ~~LOC~~ SOAJ: 300.0000 mg | SUBCUTANEOUS | 0 refills | Status: DC

## 2017-04-16 NOTE — Telephone Encounter (Signed)
Last Visit: 12/05/16 Next Visit: 05/09/17 Labs: 02/20/17 Stable Tb Gold: 07/18/16 Neg   Okay to refill per Dr. Corliss Skainseveshwar

## 2017-04-16 NOTE — Telephone Encounter (Signed)
Patient left a voicemail requesting a prescription refill of Cosentyx.

## 2017-04-16 NOTE — Telephone Encounter (Signed)
Called [employer] patient assistance to check the status of pts application. Spoke with [name] who states that they are missing prior authorization letter. A letter was faxed to the foundation on 1/ 17/18. Letter was re-faxed to foundation.   Will update once we receive a response.   [name], [name], CPhT 3:58 PM

## 2017-04-17 ENCOUNTER — Telehealth: Payer: Self-pay

## 2017-04-17 NOTE — Telephone Encounter (Signed)
Patient called stating that [employer] needs a prior authorization faxed to them. She is needing a refill on Cosnentyx from the foundation. Patient is out of refills. She missed her injection on Monday.   Per Dr. Corliss Skainseveshwar, we can provide pt to with a sample from the clinic. Pt states she will be by to pick it up this evening.   Called [employer] to see if they received the faxed prior auth letter from the clinic. Spoke with Lelon MastSamantha who states that it takes about 2 to 3 days for them to receive and upload faxes. She suggest to call back on Friday or Monday. Patient is in the re-nrollment process and can request a refill at any time. She has one refill remaining on her profile. Will check back and update once we have a response.   Brennin Durfee, [place], CPhT 10:01 AM

## 2017-04-19 ENCOUNTER — Telehealth: Payer: Self-pay | Admitting: Rheumatology

## 2017-04-19 MED ORDER — SECUKINUMAB 150 MG/ML ~~LOC~~ SOAJ: 300.0000 mg | SUBCUTANEOUS | 0 refills | Status: DC

## 2017-04-19 NOTE — Telephone Encounter (Signed)
[employer] called stating that they received the fax for patient's prior authorization but they were unable to read it.  They said when it came through it was too dark.  They requested that you call them at [phone] and verbally give them the information.

## 2017-04-19 NOTE — Telephone Encounter (Signed)
Called [employer] to give the the PA information. Spoke with Karena Addisonrene who updated the pts profile. She states that they should have an answer around 04/24/17. If pt is out of meds, she can call [employer] and schedule a delivery to hold her over. Will update once we receive a response.   Patient came by the clinic to get a sample on 04/17/17.   Ange Puskas 8:51 AM

## 2017-04-19 NOTE — Addendum Note (Signed)
Addended by: Henriette CombsHATTON, Airik Goodlin L on: 04/19/2017 01:55 PM   Modules accepted: Orders

## 2017-04-24 NOTE — Telephone Encounter (Signed)
Received a fax from [employer] patient assistance stating that pt has been approved to receive cosentyx from the foundation free of charge through the end of the calendar year.   Will send document to scan center.  Called pt to update. Left message with phone number to contact foundation to schedule shipment.   Carlisha Wisler, [place], CPhT 2:04 PM

## 2017-04-25 NOTE — Progress Notes (Deleted)
Office Visit Note  Patient: [name]             Date of Birth: 1950/09/03           MRN: [id number]             PCP: Jamal Collin, PA-C Referring: Jamal Collin, PA-C Visit Date: 05/09/2017 Occupation: @GUAROCC @    Subjective:  No chief complaint on file.   History of Present Illness: [name] is a 67 y.o. female ***   Activities of Daily Living:  Patient reports morning stiffness for *** {minute/hour:19697}.   Patient {ACTIONS;DENIES/REPORTS:21021675::"Denies"} nocturnal pain.  Difficulty dressing/grooming: {ACTIONS;DENIES/REPORTS:21021675::"Denies"} Difficulty climbing stairs: {ACTIONS;DENIES/REPORTS:21021675::"Denies"} Difficulty getting out of chair: {ACTIONS;DENIES/REPORTS:21021675::"Denies"} Difficulty using hands for taps, buttons, cutlery, and/or writing: {ACTIONS;DENIES/REPORTS:21021675::"Denies"}   No Rheumatology ROS completed.   PMFS History:  Patient Active Problem List   Diagnosis Date Noted  . Dyslipidemia 10/16/2016  . History of total knee replacement, bilateral 10/16/2016  . Primary osteoarthritis of both hands 10/16/2016  . History of diabetes mellitus 10/16/2016  . Other chronic pain 10/16/2016  . History of glaucoma 10/16/2016  . Psoriatic arthropathy (HCC) 07/06/2016  . Other psoriasis 07/06/2016  . High risk medications (not anticoagulants) long-term use 07/06/2016  . Fibromyalgia 07/06/2016  . Chronic fatigue 07/06/2016  . Primary insomnia 07/06/2016    Past Medical History:  Diagnosis Date  . Arthritis   . Cataract   . Diabetes mellitus without complication (HCC)   . Glaucoma   . Hypertension     Family History  Problem Relation Age of Onset  . Cancer Sister   . Diabetes Sister   . COPD Brother   . Diabetes Brother    Past Surgical History:  Procedure Laterality Date  . ABDOMINAL HYSTERECTOMY    . APPENDECTOMY    . CARPAL TUNNEL RELEASE    . CHOLECYSTECTOMY    . FEMUR FRACTURE SURGERY    . REPLACEMENT  TOTAL KNEE BILATERAL    . TOTAL SHOULDER ARTHROPLASTY    . TRIGGER FINGER RELEASE     Social History   Social History Narrative  . Not on file     Objective: Vital Signs: There were no vitals taken for this visit.   Physical Exam   Musculoskeletal Exam: ***  CDAI Exam: No CDAI exam completed.    Investigation: No additional findings.TB Gold: 07/18/2016 Negative  CBC Latest Ref Rng & Units 02/20/2017 12/05/2016 07/18/2016  WBC 3.8 - 10.8 Thousand/uL 9.8 8.7 11.7(H)  Hemoglobin 11.7 - 15.5 g/dL 40.9 81.1 91.4  Hematocrit 35.0 - 45.0 % 41.4 38.8 43.7  Platelets 140 - 400 Thousand/uL 285 244 273   CMP Latest Ref Rng & Units 02/20/2017 12/05/2016 07/18/2016  Glucose 65 - 99 mg/dL 782(N) 562(Z) 308(M)  BUN 7 - 25 mg/dL 17 14 19   Creatinine 0.50 - 0.99 mg/dL 5.78 4.69 6.29(B)  Sodium 135 - 146 mmol/L 139 139 135  Potassium 3.5 - 5.3 mmol/L 4.4 4.4 5.1  Chloride 98 - 110 mmol/L 100 99 97(L)  CO2 20 - 32 mmol/L 31 31 25   Calcium 8.6 - 10.4 mg/dL 8.7 8.6 9.4  Total Protein 6.1 - 8.1 g/dL 6.7 6.6 6.9  Total Bilirubin 0.2 - 1.2 mg/dL 0.5 0.4 0.6  Alkaline Phos 33 - 130 U/L - - 70  AST 10 - 35 U/L 22 30 31   ALT 6 - 29 U/L 20 25 25     Imaging: No results found.  Speciality Comments: No specialty comments available.  Procedures:  No procedures performed Allergies: Aspirin; Bee venom; Cephalosporins; Clarithromycin; Codeine; Demerol; E-mycin [erythromycin base]; Etodolac; Gatifloxacin; Iodinated diagnostic agents; Levaquin  [levofloxacin in d5w]; Levofloxacin; Levofloxacin; Meperidine; Morphine and related; Nsaids; Other; Phenothiazines; Shellfish-derived products; Tequin; Penicillins; and Sulfa drugs cross reactors   Assessment / Plan:     Visit Diagnoses: No diagnosis found.    Orders: No orders of the defined types were placed in this encounter.  No orders of the defined types were placed in this encounter.   Face-to-face time spent with patient was *** minutes. 50%  of time was spent in counseling and coordination of care.  Follow-Up Instructions: No Follow-up on file.   Ellen HenriMarissa C Tiann Saha, CMA  Note - This record has been created using Animal nutritionistDragon software.  Chart creation errors have been sought, but may not always  have been located. Such creation errors do not reflect on  the standard of medical care.

## 2017-05-07 NOTE — Progress Notes (Deleted)
Office Visit Note  Patient: [name]             Date of Birth: [birth year]           MRN: [id number]             PCP: Caitlin Collin, PA-C Referring: Caitlin Collin, PA-C Visit Date: 05/21/2017 Occupation: @GUAROCC @    Subjective:  No chief complaint on file.   History of Present Illness: [name] is a 67 y.o. female ***   Activities of Daily Living:  Patient reports morning stiffness for *** {minute/hour:19697}.   Patient {ACTIONS;DENIES/REPORTS:21021675::"Denies"} nocturnal pain.  Difficulty dressing/grooming: {ACTIONS;DENIES/REPORTS:21021675::"Denies"} Difficulty climbing stairs: {ACTIONS;DENIES/REPORTS:21021675::"Denies"} Difficulty getting out of chair: {ACTIONS;DENIES/REPORTS:21021675::"Denies"} Difficulty using hands for taps, buttons, cutlery, and/or writing: {ACTIONS;DENIES/REPORTS:21021675::"Denies"}   No Rheumatology ROS completed.   PMFS History:  Patient Active Problem List   Diagnosis Date Noted  . Dyslipidemia 10/16/2016  . History of total knee replacement, bilateral 10/16/2016  . Primary osteoarthritis of both hands 10/16/2016  . History of diabetes mellitus 10/16/2016  . Other chronic pain 10/16/2016  . History of glaucoma 10/16/2016  . Psoriatic arthropathy (HCC) 07/06/2016  . Other psoriasis 07/06/2016  . High risk medications (not anticoagulants) long-term use 07/06/2016  . Fibromyalgia 07/06/2016  . Chronic fatigue 07/06/2016  . Primary insomnia 07/06/2016    Past Medical History:  Diagnosis Date  . Arthritis   . Cataract   . Diabetes mellitus without complication (HCC)   . Glaucoma   . Hypertension     Family History  Problem Relation Age of Onset  . Cancer Sister   . Diabetes Sister   . COPD Brother   . Diabetes Brother    Past Surgical History:  Procedure Laterality Date  . ABDOMINAL HYSTERECTOMY    . APPENDECTOMY    . CARPAL TUNNEL RELEASE    . CHOLECYSTECTOMY    . FEMUR FRACTURE SURGERY    . REPLACEMENT  TOTAL KNEE BILATERAL    . TOTAL SHOULDER ARTHROPLASTY    . TRIGGER FINGER RELEASE     Social History   Social History Narrative  . Not on file     Objective: Vital Signs: There were no vitals taken for this visit.   Physical Exam   Musculoskeletal Exam: ***  CDAI Exam: No CDAI exam completed.    Investigation: No additional findings. CBC Latest Ref Rng & Units 02/20/2017 12/05/2016 07/18/2016  WBC 3.8 - 10.8 Thousand/uL 9.8 8.7 11.7(H)  Hemoglobin 11.7 - 15.5 g/dL 40.9 81.1 91.4  Hematocrit 35.0 - 45.0 % 41.4 38.8 43.7  Platelets 140 - 400 Thousand/uL 285 244 273   CMP Latest Ref Rng & Units 02/20/2017 12/05/2016 07/18/2016  Glucose 65 - 99 mg/dL 782(N) 562(Z) 308(M)  BUN 7 - 25 mg/dL 17 14 19   Creatinine 0.50 - 0.99 mg/dL 5.78 4.69 6.29(B)  Sodium 135 - 146 mmol/L 139 139 135  Potassium 3.5 - 5.3 mmol/L 4.4 4.4 5.1  Chloride 98 - 110 mmol/L 100 99 97(L)  CO2 20 - 32 mmol/L 31 31 25   Calcium 8.6 - 10.4 mg/dL 8.7 8.6 9.4  Total Protein 6.1 - 8.1 g/dL 6.7 6.6 6.9  Total Bilirubin 0.2 - 1.2 mg/dL 0.5 0.4 0.6  Alkaline Phos 33 - 130 U/L - - 70  AST 10 - 35 U/L 22 30 31   ALT 6 - 29 U/L 20 25 25     Imaging: No results found.  Speciality Comments: No specialty comments available.    Procedures:  No  procedures performed Allergies: Aspirin; Bee venom; Cephalosporins; Clarithromycin; Codeine; Demerol; E-mycin [erythromycin base]; Etodolac; Gatifloxacin; Iodinated diagnostic agents; Levaquin  [levofloxacin in d5w]; Levofloxacin; Levofloxacin; Meperidine; Morphine and related; Nsaids; Other; Phenothiazines; Shellfish-derived products; Tequin; Penicillins; and Sulfa drugs cross reactors   Assessment / Plan:     Visit Diagnoses: Psoriatic arthropathy (HCC)  Other psoriasis  High risk medications (not anticoagulants) long-term use  Primary osteoarthritis of both hands  Fibromyalgia  Chronic fatigue  Primary insomnia  History of total knee replacement,  bilateral  Dyslipidemia  History of glaucoma  History of diabetes mellitus    Orders: No orders of the defined types were placed in this encounter.  No orders of the defined types were placed in this encounter.   Face-to-face time spent with patient was *** minutes. 50% of time was spent in counseling and coordination of care.  Follow-Up Instructions: No Follow-up on file.   Gearldine Bienenstockaylor M Kamrynn Melott, PA-C  Note - This record has been created using Dragon software.  Chart creation errors have been sought, but may not always  have been located. Such creation errors do not reflect on  the standard of medical care.

## 2017-05-09 ENCOUNTER — Ambulatory Visit: Payer: Medicare Other | Admitting: Rheumatology

## 2017-05-21 ENCOUNTER — Ambulatory Visit: Payer: Medicare Other | Admitting: Physician Assistant

## 2017-05-21 NOTE — Progress Notes (Signed)
Office Visit Note  Patient: [name]             Date of Birth: 1950-03-27           MRN: [id number]             PCP: Jamal CollinHedgecock, Suzanne, PA-C Referring: Jamal CollinHedgecock, Suzanne, PA-C Visit Date: 05/28/2017 Occupation: @GUAROCC @    Subjective:  Joint stiffness    History of Present Illness: Caitlin BlazingDixie L Chiusano is a 67 y.o. female with history of psoriatic arthritis fibromyalgia and osteoarthritis.  Patient states that she has been having increased joint stiffness in all of her joints.  She denies any increased joint swelling or joint pain at this time.  She is continuing to take Cosentyx 300 mg once a month.  She states that her fibromyalgia has been flaring over the past 1 month.  She states she is having more bad days than good days.  She states that she continues to have worsening fatigue and insomnia.  She states she has been having increased generalized muscle aches and muscle tenderness.  She states that she has been more achy the frequent weather changes.  She states she is having increased joint stiffness in her bilateral knee replacements.  She denies any swelling in her knees at this time.  She states that her psoriasis has cleared.    Activities of Daily Living:  Patient reports morning stiffness for  all day.   Patient Reports nocturnal pain.  Difficulty dressing/grooming: Denies Difficulty climbing stairs: Reports Difficulty getting out of chair: Reports Difficulty using hands for taps, buttons, cutlery, and/or writing: Reports   Review of Systems  Constitutional: Positive for fatigue.  HENT: Positive for mouth dryness. Negative for mouth sores and nose dryness.   Eyes: Positive for dryness. Negative for pain and visual disturbance.  Respiratory: Negative for cough, hemoptysis, shortness of breath and difficulty breathing.   Cardiovascular: Negative for chest pain, palpitations, hypertension and swelling in legs/feet.  Gastrointestinal: Negative for blood in stool,  constipation and diarrhea.  Endocrine: Negative for increased urination.  Genitourinary: Negative for painful urination.  Musculoskeletal: Positive for arthralgias, joint pain, joint swelling, myalgias, morning stiffness, muscle tenderness and myalgias. Negative for muscle weakness.  Skin: Negative for color change, pallor, rash, hair loss, nodules/bumps, skin tightness, ulcers and sensitivity to sunlight.  Allergic/Immunologic: Negative for susceptible to infections.  Neurological: Positive for dizziness (Hx of vertigo-on Meclizine and Zofran). Negative for numbness, headaches and weakness.  Hematological: Negative for swollen glands.  Psychiatric/Behavioral: Positive for sleep disturbance. Negative for depressed mood. The patient is not nervous/anxious.     PMFS History:  Patient Active Problem List   Diagnosis Date Noted  . Dyslipidemia 10/16/2016  . History of total knee replacement, bilateral 10/16/2016  . Primary osteoarthritis of both hands 10/16/2016  . History of diabetes mellitus 10/16/2016  . Other chronic pain 10/16/2016  . History of glaucoma 10/16/2016  . Psoriatic arthropathy (HCC) 07/06/2016  . Other psoriasis 07/06/2016  . High risk medications (not anticoagulants) long-term use 07/06/2016  . Fibromyalgia 07/06/2016  . Chronic fatigue 07/06/2016  . Primary insomnia 07/06/2016    Past Medical History:  Diagnosis Date  . Arthritis   . Cataract   . Diabetes mellitus without complication (HCC)   . Glaucoma   . Hypertension   . Vertigo     Family History  Problem Relation Age of Onset  . Cancer Sister   . Diabetes Sister   . COPD Brother   . Diabetes  Brother    Past Surgical History:  Procedure Laterality Date  . ABDOMINAL HYSTERECTOMY    . APPENDECTOMY    . CARPAL TUNNEL RELEASE    . CHOLECYSTECTOMY    . FEMUR FRACTURE SURGERY    . REPLACEMENT TOTAL KNEE BILATERAL    . TOTAL SHOULDER ARTHROPLASTY    . TRIGGER FINGER RELEASE     Social History    Social History Narrative  . Not on file     Objective: Vital Signs: BP 131/75 (BP Location: Right Arm, Patient Position: Sitting)   Pulse 77   Resp 14   Ht 5' 6.5" (1.689 m)   Wt 262 lb (118.8 kg)   BMI 41.65 kg/m    Physical Exam  Constitutional: She is oriented to person, place, and time. She appears well-developed and well-nourished.  HENT:  Head: Normocephalic and atraumatic.  Eyes: Conjunctivae and EOM are normal.  Neck: Normal range of motion.  Cardiovascular: Normal rate, regular rhythm, normal heart sounds and intact distal pulses.  Pulmonary/Chest: Effort normal and breath sounds normal.  Abdominal: Soft. Bowel sounds are normal.  Lymphadenopathy:    She has no cervical adenopathy.  Neurological: She is alert and oriented to person, place, and time.  Skin: Skin is warm and dry. Capillary refill takes less than 2 seconds.  Psychiatric: She has a normal mood and affect. Her behavior is normal.  Nursing note and vitals reviewed.    Musculoskeletal Exam: C-spine good ROM.  Difficult to assess thoracic and lumbar ROM.  No midline spinal tenderness. Shoulder joints, elbow joints, wrist joints, MCPs, PIPs, and DIPs good ROM with no synovitis. Dupuytren's contracture on palmar aspect of right hand. Hip joints, knee joints, ankle joints, MTPs, PIPs, and DIPs good ROM with no synovitis.  No warmth or effusion of knees.    CDAI Exam: CDAI Homunculus Exam:   Joint Counts:  CDAI Tender Joint count: 0 CDAI Swollen Joint count: 0  Global Assessments:  Patient Global Assessment: 7   CDAI Calculated Score: 7    Investigation: No additional findings. CBC Latest Ref Rng & Units 02/20/2017 12/05/2016 07/18/2016  WBC 3.8 - 10.8 Thousand/uL 9.8 8.7 11.7(H)  Hemoglobin 11.7 - 15.5 g/dL 16.1 09.6 04.5  Hematocrit 35.0 - 45.0 % 41.4 38.8 43.7  Platelets 140 - 400 Thousand/uL 285 244 273   CMP Latest Ref Rng & Units 02/20/2017 12/05/2016 07/18/2016  Glucose 65 - 99 mg/dL 409(W)  119(J) 478(G)  BUN 7 - 25 mg/dL 17 14 19   Creatinine 0.50 - 0.99 mg/dL 9.56 2.13 0.86(V)  Sodium 135 - 146 mmol/L 139 139 135  Potassium 3.5 - 5.3 mmol/L 4.4 4.4 5.1  Chloride 98 - 110 mmol/L 100 99 97(L)  CO2 20 - 32 mmol/L 31 31 25   Calcium 8.6 - 10.4 mg/dL 8.7 8.6 9.4  Total Protein 6.1 - 8.1 g/dL 6.7 6.6 6.9  Total Bilirubin 0.2 - 1.2 mg/dL 0.5 0.4 0.6  Alkaline Phos 33 - 130 U/L - - 70  AST 10 - 35 U/L 22 30 31   ALT 6 - 29 U/L 20 25 25     Imaging: No results found.  Speciality Comments: No specialty comments available.    Procedures:  No procedures performed Allergies: Aspirin; Bee venom; Cephalosporins; Clarithromycin; Codeine; Demerol; E-mycin [erythromycin base]; Etodolac; Gatifloxacin; Iodinated diagnostic agents; Levaquin  [levofloxacin in d5w]; Levofloxacin; Levofloxacin; Lyrica [pregabalin]; Meperidine; Morphine and related; Nsaids; Other; Phenothiazines; Shellfish-derived products; Tequin; Penicillins; and Sulfa drugs cross reactors   Assessment / Plan:  Visit Diagnoses: Psoriatic arthropathy (HCC): She has no synovitis or dactylitis on exam.  She has been having increased joint stiffness in all of her joints.  She does not report increased joint pain or joint swelling.  We are going to try switching her dosing of Cosentyx to 1 pen subcutaneous injection every other week.  A refill of Cosentyx was sent to the pharmacy today.  Psoriasis: She has no active psoriasis at this time.  I was unable to tell she has nail pitting due to her nail polish.  High risk medication use - Cosentyx 300 mg sq q other week. Pt has failed Humira and then Enbrel.  CBC, CMP, TB Gold were ordered today.  She will return in 3 months for lab work to monitor for drug toxicity.- Plan: CBC with Differential/Platelet, COMPLETE METABOLIC PANEL WITH GFR, QuantiFERON-TB Gold Plus  Primary osteoarthritis of both hands: She has PIP and DIP synovial thickening consistent with osteoarthritis. She has  joint stiffness in her bilateral hands.  History of total bilateral knee replacement: No warmth or effusion on exam.  She is been experiencing increased joint stiffness in her bilateral knees.  Fibromyalgia: Her fibromyalgia has been flaring for the past 1 month.  She is having increased generalized muscle tension and muscle tenderness.  She was encouraged her to exercise on a regular basis.  We discussed good sleep hygiene.  Other medical conditions are listed as follows:  History of femur fracture  History of hypercholesterolemia  Other insomnia: Chronic  Other fatigue: Chronic and related to insomnia.  History of diabetes mellitus    Orders: Orders Placed This Encounter  Procedures  . CBC with Differential/Platelet  . COMPLETE METABOLIC PANEL WITH GFR  . QuantiFERON-TB Gold Plus   Meds ordered this encounter  Medications  . Secukinumab (COSENTYX SENSOREADY PEN) 150 MG/ML SOAJ    Sig: Inject 300 mg into the skin every 30 (thirty) days.    Dispense:  6 pen    Refill:  0    Urgent    Face-to-face time spent with patient was 30 minutes.> 50% of time was spent in counseling and coordination of care.  Follow-Up Instructions: Return in about 5 months (around 10/28/2017) for Psoriatic arthritis, Osteoarthritis, Fibromyalgia.   Gearldine Bienenstock, PA-C  Note - This record has been created using Dragon software.  Chart creation errors have been sought, but may not always  have been located. Such creation errors do not reflect on  the standard of medical care.

## 2017-05-28 ENCOUNTER — Encounter: Payer: Self-pay | Admitting: Physician Assistant

## 2017-05-28 ENCOUNTER — Ambulatory Visit (INDEPENDENT_AMBULATORY_CARE_PROVIDER_SITE_OTHER): Payer: Medicare Other | Admitting: Physician Assistant

## 2017-05-28 VITALS — BP 131/75 | HR 77 | Resp 14 | Ht 66.5 in | Wt 262.0 lb

## 2017-05-28 DIAGNOSIS — L4050 Arthropathic psoriasis, unspecified: Secondary | ICD-10-CM

## 2017-05-28 DIAGNOSIS — Z8781 Personal history of (healed) traumatic fracture: Secondary | ICD-10-CM | POA: Diagnosis not present

## 2017-05-28 DIAGNOSIS — G4709 Other insomnia: Secondary | ICD-10-CM

## 2017-05-28 DIAGNOSIS — Z79899 Other long term (current) drug therapy: Secondary | ICD-10-CM | POA: Diagnosis not present

## 2017-05-28 DIAGNOSIS — L409 Psoriasis, unspecified: Secondary | ICD-10-CM | POA: Diagnosis not present

## 2017-05-28 DIAGNOSIS — Z96653 Presence of artificial knee joint, bilateral: Secondary | ICD-10-CM

## 2017-05-28 DIAGNOSIS — Z8639 Personal history of other endocrine, nutritional and metabolic disease: Secondary | ICD-10-CM

## 2017-05-28 DIAGNOSIS — M19041 Primary osteoarthritis, right hand: Secondary | ICD-10-CM | POA: Diagnosis not present

## 2017-05-28 DIAGNOSIS — M797 Fibromyalgia: Secondary | ICD-10-CM | POA: Diagnosis not present

## 2017-05-28 DIAGNOSIS — M19042 Primary osteoarthritis, left hand: Secondary | ICD-10-CM

## 2017-05-28 DIAGNOSIS — R5383 Other fatigue: Secondary | ICD-10-CM

## 2017-05-28 MED ORDER — SECUKINUMAB 150 MG/ML ~~LOC~~ SOAJ: 300.0000 mg | SUBCUTANEOUS | 0 refills | Status: DC

## 2017-05-28 NOTE — Patient Instructions (Signed)
Standing Labs We placed an order today for your standing lab work.    Please come back and get your standing labs in July and every 3 months   We have open lab Monday through Friday from 8:30-11:30 AM and 1:30-4:00 PM  at the office of Dr. Shaili Deveshwar.   You may experience shorter wait times on Monday and Friday afternoons. The office is located at [address], [address], [place], [place] [address] No appointment is necessary.   Labs are drawn by [facility].  You may receive a bill from [facility] for your lab work. If you have any questions regarding directions or hours of operation,  please call [phone].    

## 2017-05-29 NOTE — Progress Notes (Signed)
Potassium is elevated. Please fax results to PCP.

## 2017-05-30 LAB — CBC WITH DIFFERENTIAL/PLATELET
Basophils Absolute: 18 cells/uL (ref 0–200)
Basophils Relative: 0.2 %
EOS PCT: 1.2 %
Eosinophils Absolute: 108 cells/uL (ref 15–500)
HCT: 41.9 % (ref 35.0–45.0)
Hemoglobin: 14 g/dL (ref 11.7–15.5)
Lymphs Abs: 2943 cells/uL (ref 850–3900)
MCH: 28.7 pg (ref 27.0–33.0)
MCHC: 33.4 g/dL (ref 32.0–36.0)
MCV: 85.9 fL (ref 80.0–100.0)
MPV: 11.3 fL (ref 7.5–12.5)
Monocytes Relative: 7 %
NEUTROS ABS: 5301 {cells}/uL (ref 1500–7800)
NEUTROS PCT: 58.9 %
PLATELETS: 265 10*3/uL (ref 140–400)
RBC: 4.88 10*6/uL (ref 3.80–5.10)
RDW: 12.7 % (ref 11.0–15.0)
Total Lymphocyte: 32.7 %
WBC: 9 10*3/uL (ref 3.8–10.8)
WBCMIX: 630 {cells}/uL (ref 200–950)

## 2017-05-30 LAB — COMPLETE METABOLIC PANEL WITH GFR
AG Ratio: 1.5 (calc) (ref 1.0–2.5)
ALKALINE PHOSPHATASE (APISO): 76 U/L (ref 33–130)
ALT: 16 U/L (ref 6–29)
AST: 21 U/L (ref 10–35)
Albumin: 4 g/dL (ref 3.6–5.1)
BILIRUBIN TOTAL: 0.4 mg/dL (ref 0.2–1.2)
BUN/Creatinine Ratio: 31 (calc) — ABNORMAL HIGH (ref 6–22)
BUN: 30 mg/dL — ABNORMAL HIGH (ref 7–25)
CHLORIDE: 99 mmol/L (ref 98–110)
CO2: 29 mmol/L (ref 20–32)
Calcium: 8.9 mg/dL (ref 8.6–10.4)
Creat: 0.98 mg/dL (ref 0.50–0.99)
GFR, Est African American: 70 mL/min/{1.73_m2} (ref >=60)
GFR, Est Non African American: 60 mL/min/{1.73_m2} (ref >=60)
GLUCOSE: 294 mg/dL — AB (ref 65–99)
Globulin: 2.7 g/dL (calc) (ref 1.9–3.7)
Potassium: 5.8 mmol/L — ABNORMAL HIGH (ref 3.5–5.3)
Sodium: 134 mmol/L — ABNORMAL LOW (ref 135–146)
Total Protein: 6.7 g/dL (ref 6.1–8.1)

## 2017-05-30 LAB — QUANTIFERON-TB GOLD PLUS
NIL: 0.03 IU/mL
QuantiFERON-TB Gold Plus: NEGATIVE
TB2-NIL: <0 IU/mL

## 2017-05-30 NOTE — Progress Notes (Signed)
CBC WNL. TB gold negative.

## 2017-06-05 ENCOUNTER — Other Ambulatory Visit: Payer: Medicare Other | Admitting: Rheumatology

## 2017-08-20 ENCOUNTER — Telehealth: Payer: Self-pay | Admitting: Rheumatology

## 2017-08-20 NOTE — Telephone Encounter (Signed)
Patient requesting refill on Cosentyx sent to [facility]. 

## 2017-08-21 NOTE — Telephone Encounter (Signed)
Last Visit: 05/28/17 Next Visit: 11/05/17 Labs: 05/28/17 Potassium is elevated. TB Gold: 05/28/17 Neg   Patient advised she is due to update labs and she will update 08/26/17.   Okay to refill per Sherron Alesaylor Dale. PA-C

## 2017-08-27 ENCOUNTER — Other Ambulatory Visit: Payer: Self-pay

## 2017-08-27 DIAGNOSIS — Z79899 Other long term (current) drug therapy: Secondary | ICD-10-CM

## 2017-08-28 ENCOUNTER — Telehealth: Payer: Self-pay | Admitting: Rheumatology

## 2017-08-28 LAB — CBC WITH DIFFERENTIAL/PLATELET
Basophils Absolute: 31 cells/uL (ref 0–200)
Basophils Relative: 0.4 %
EOS PCT: 2.2 %
Eosinophils Absolute: 172 cells/uL (ref 15–500)
HEMATOCRIT: 38.3 % (ref 35.0–45.0)
HEMOGLOBIN: 12.5 g/dL (ref 11.7–15.5)
Lymphs Abs: 3650 cells/uL (ref 850–3900)
MCH: 27.9 pg (ref 27.0–33.0)
MCHC: 32.6 g/dL (ref 32.0–36.0)
MCV: 85.5 fL (ref 80.0–100.0)
MPV: 10.7 fL (ref 7.5–12.5)
Monocytes Relative: 7.7 %
NEUTROS ABS: 3346 {cells}/uL (ref 1500–7800)
Neutrophils Relative %: 42.9 %
Platelets: 265 10*3/uL (ref 140–400)
RBC: 4.48 10*6/uL (ref 3.80–5.10)
RDW: 12.4 % (ref 11.0–15.0)
Total Lymphocyte: 46.8 %
WBC mixed population: 601 cells/uL (ref 200–950)
WBC: 7.8 10*3/uL (ref 3.8–10.8)

## 2017-08-28 LAB — COMPLETE METABOLIC PANEL WITH GFR
AG Ratio: 1.3 (calc) (ref 1.0–2.5)
ALKALINE PHOSPHATASE (APISO): 69 U/L (ref 33–130)
ALT: 7 U/L (ref 6–29)
AST: 10 U/L (ref 10–35)
Albumin: 3.5 g/dL — ABNORMAL LOW (ref 3.6–5.1)
BUN: 13 mg/dL (ref 7–25)
CO2: 31 mmol/L (ref 20–32)
Calcium: 8.8 mg/dL (ref 8.6–10.4)
Chloride: 102 mmol/L (ref 98–110)
Creat: 0.86 mg/dL (ref 0.50–0.99)
GFR, EST NON AFRICAN AMERICAN: 70 mL/min/{1.73_m2} (ref >=60)
GFR, Est African American: 82 mL/min/{1.73_m2} (ref >=60)
GLOBULIN: 2.8 g/dL (ref 1.9–3.7)
Glucose, Bld: 75 mg/dL (ref 65–99)
Potassium: 4.7 mmol/L (ref 3.5–5.3)
SODIUM: 140 mmol/L (ref 135–146)
Total Bilirubin: 0.5 mg/dL (ref 0.2–1.2)
Total Protein: 6.3 g/dL (ref 6.1–8.1)

## 2017-08-28 NOTE — Telephone Encounter (Signed)
Patient has been scheduled for an appointment on 08/29/17 at 1:20 pm.

## 2017-08-28 NOTE — Progress Notes (Signed)
Office Visit Note  Patient: [name]             Date of Birth: 12-May-1950           MRN: [id number]             PCP: Jamal CollinHedgecock, Suzanne, PA-C Referring: Jamal CollinHedgecock, Suzanne, PA-C Visit Date: 08/29/2017 Occupation: @GUAROCC @  Subjective:  Pain in multiple joints   History of Present Illness: [name] is a 67 y.o. female with history of psoriatic arthritis and osteoarthritis.  Patient has been injecting Cosentyx subcutaneously every other week.  She states initially she noticed a benefit when splitting of her dose but now she is having increased joint pain and joint swelling.  In the past she has failed Enbrel and Humira.  She was previously on methotrexate but was doing well and was taken off methotrexate.  She was previously on methotrexate 0.8 mL subcutaneously once weekly.  She states that she discontinue methotrexate at least one year ago.  She states she is having pain in multiple joints including bilateral hands bilateral wrists bilateral shoulders bilateral knees and bilateral ankles.  She states she has noticed some swelling in her hands knees and feet.  She states that she is no active psoriasis at this time.  She states she has bilateral SI joint pain but no Achilles tendinitis or plantar fasciitis.  She denies being on prednisone recently.  She states her fibromyalgia has also been flaring along with her psoriatic arthritis.  She states she has chronic insomnia as well as worsening fatigue.  She has been having pain at night which has been preventing her from getting a night stress.  She states that she feels as though her balance has been worsening.  She states that she walks with a cane.  She denies any recent falls.    Activities of Daily Living:  Patient reports morning stiffness  all day.   Patient Reports nocturnal pain.  Difficulty dressing/grooming: Denies Difficulty climbing stairs: Reports Difficulty getting out of chair: Reports Difficulty using hands for  taps, buttons, cutlery, and/or writing: Reports  Review of Systems  Constitutional: Positive for fatigue.  HENT: Negative for mouth sores, mouth dryness and nose dryness.   Eyes: Positive for dryness. Negative for pain and visual disturbance.  Respiratory: Negative for cough, hemoptysis, shortness of breath and difficulty breathing.   Cardiovascular: Negative for chest pain, palpitations, hypertension and swelling in legs/feet.  Gastrointestinal: Negative for blood in stool, constipation and diarrhea.  Endocrine: Negative for increased urination.  Genitourinary: Negative for painful urination.  Musculoskeletal: Positive for arthralgias, joint pain, joint swelling, myalgias, morning stiffness, muscle tenderness and myalgias. Negative for muscle weakness.  Skin: Negative for color change, pallor, rash, hair loss, nodules/bumps, skin tightness, ulcers and sensitivity to sunlight.  Allergic/Immunologic: Negative for susceptible to infections.  Neurological: Negative for dizziness, numbness, headaches and weakness.  Hematological: Negative for swollen glands.  Psychiatric/Behavioral: Positive for sleep disturbance. Negative for depressed mood. The patient is not nervous/anxious.     PMFS History:  Patient Active Problem List   Diagnosis Date Noted  . Dyslipidemia 10/16/2016  . History of total knee replacement, bilateral 10/16/2016  . Primary osteoarthritis of both hands 10/16/2016  . History of diabetes mellitus 10/16/2016  . Other chronic pain 10/16/2016  . History of glaucoma 10/16/2016  . Psoriatic arthropathy (HCC) 07/06/2016  . Other psoriasis 07/06/2016  . High risk medications (not anticoagulants) long-term use 07/06/2016  . Fibromyalgia 07/06/2016  . Chronic fatigue  07/06/2016  . Primary insomnia 07/06/2016    Past Medical History:  Diagnosis Date  . Arthritis   . Cataract   . Diabetes mellitus without complication (HCC)   . Glaucoma   . Hypertension   . Vertigo       Family History  Problem Relation Age of Onset  . Cancer Sister   . Diabetes Sister   . COPD Brother   . Diabetes Brother    Past Surgical History:  Procedure Laterality Date  . ABDOMINAL HYSTERECTOMY    . APPENDECTOMY    . CARPAL TUNNEL RELEASE    . CHOLECYSTECTOMY    . FEMUR FRACTURE SURGERY    . REPLACEMENT TOTAL KNEE BILATERAL    . TOTAL SHOULDER ARTHROPLASTY    . TRIGGER FINGER RELEASE     Social History   Social History Narrative  . Not on file    Objective: Vital Signs: BP 135/73 (BP Location: Left Arm, Patient Position: Sitting, Cuff Size: Large)   Pulse 77   Resp 16   Ht 5' 6.5" (1.689 m)   Wt 267 lb (121.1 kg)   BMI 42.45 kg/m    Physical Exam  Constitutional: She is oriented to person, place, and time. She appears well-developed and well-nourished.  HENT:  Head: Normocephalic and atraumatic.  Eyes: Conjunctivae and EOM are normal.  Neck: Normal range of motion.  Cardiovascular: Normal rate, regular rhythm, normal heart sounds and intact distal pulses.  Pulmonary/Chest: Effort normal and breath sounds normal.  Abdominal: Soft. Bowel sounds are normal.  Lymphadenopathy:    She has no cervical adenopathy.  Neurological: She is alert and oriented to person, place, and time.  Skin: Skin is warm and dry. Capillary refill takes less than 2 seconds.  Psychiatric: She has a normal mood and affect. Her behavior is normal.  Nursing note and vitals reviewed.    Musculoskeletal Exam: She has generalized hyperalgesia on exam.  C-spine good range of motion.  Difficult to fully assess thoracic and lumbar spine due to sitting.  No midline spinal tenderness.  She has bilateral SI joint tenderness.  Slightly limited shoulder abduction with discomfort.  Elbow joints, wrist joints, MCPs, PIPs, DIPs good range of motion.  Tenderness of all PIP and DIP joints.  No MCP tenderness or synovitis noted.  Dupuytren's contracture on the palmar aspect of the right hand.  Knee joints,  ankle joints, MTPs, PIPs, DIPs good range of motion with no synovitis.  No warmth or effusion of bilateral knee joints.  CDAI Exam: CDAI Homunculus Exam:   Joint Counts:  CDAI Tender Joint count: 0 CDAI Swollen Joint count: 0  Global Assessments:  Patient Global Assessment: 8 Provider Global Assessment: 8  CDAI Calculated Score: 16   Investigation: No additional findings.  Imaging: No results found.  Recent Labs: Lab Results  Component Value Date   WBC 7.8 08/27/2017   HGB 12.5 08/27/2017   PLT 265 08/27/2017   NA 140 08/27/2017   K 4.7 08/27/2017   CL 102 08/27/2017   CO2 31 08/27/2017   GLUCOSE 75 08/27/2017   BUN 13 08/27/2017   CREATININE 0.86 08/27/2017   BILITOT 0.5 08/27/2017   ALKPHOS 70 07/18/2016   AST 10 08/27/2017   ALT 7 08/27/2017   PROT 6.3 08/27/2017   ALBUMIN 3.8 07/18/2016   CALCIUM 8.8 08/27/2017   GFRAA 82 08/27/2017    Speciality Comments: No specialty comments available.  Procedures:  No procedures performed Allergies: Aspirin; Bee venom; Cephalosporins; Clarithromycin; Codeine;  Demerol; E-mycin [erythromycin base]; Etodolac; Gatifloxacin; Iodinated diagnostic agents; Levaquin  [levofloxacin in d5w]; Levofloxacin; Levofloxacin; Lyrica [pregabalin]; Meperidine; Morphine and related; Nsaids; Other; Phenothiazines; Shellfish-derived products; Tequin; Penicillins; and Sulfa drugs cross reactors   Assessment / Plan:     Visit Diagnoses: Psoriatic arthropathy (HCC): She has no synovitis on exam.  She has been having increased joint pain in multiple joints including bilateral hands, bilateral wrists, bilateral shoulders, bilateral knees and, bilateral feet.  She is also had increased joint stiffness.  She had an ultrasound performed on 02/20/2017 that revealed mild synovitis but no erosive changes. She was last seen on 05/28/2017 and was having increased joint stiffness and discomfort.  At that time we recommended splitting Cosentyx dose to every other  week.  Initially she noticed a benefit but has been having increased breakthrough pain.  She previously failed Humira and Enbrel.  She was on methotrexate in 2017 but had elevated LFTs and had to discontinue.  She would like to stay on Cosentyx at this time.  We discussed adding on Arava to her current treatment regimen.  Indications, contraindications, and potential side effects were discussed.  All questions were addressed.  Consent was obtained.  She will start taking Arava 10 mg by mouth for 2 weeks and if labs are stable in 2 weeks she will increase to 20 mg daily.  A prescription for Ranae Plumber will be sent to the pharmacy today.  She was advised to notify us if she develops increased joint pain or joint swelling.  She will return to the office in 3 months. If she fails this current treatment regimen, we will consider switching her to [employer].  Medication counseling:  TB Gold: 05/28/17 negative   Pregnancy status:  Post-menopausal   Patient was counseled on the purpose, proper use, and adverse effects of leflunomide including risk of infection, nausea/diarrhea/weight loss, increase in blood pressure, rash, hair loss, tingling in the hands and feet, and signs and symptoms of interstitial lung disease.  Discussed the importance of frequent monitoring of liver function and blood counts, and patient was provided with instructions for standing labs.  Discussed importance of birth control while on leflunomide due to risk of congenital abnormalities, and patient confirms she is post-menopausal.  Provided patient with educational materials on leflunomide and answered all questions.  Patient consented to [place] use, and consent will be uploaded into the media tab.    Other psoriasis: She has no active psoriasis at this time.  High risk medication use - Cosentyx 300 mg subcu every other week.  She has previously failed Humira and Enbrel.  She was on methotrexate in 2017 but it was discontinued due to elevated LFTs.  We  will start her on Arava 10 mg for 2 weeks, if labs are stable in 2 weeks, she will increase to 20 mg daily.  She will return for lab work in 2 weeks x 2, then 2 months, then every 3 months.  Standing orders are in place.  Primary osteoarthritis of both hands: She has PIP and DIP synovial thickening consistent with osteoarthritis of bilateral hands.  Joint protection and muscle strengthening were discussed.  History of total knee replacement, bilateral: Chronic pain and stiffness.  No warmth or effusion was noted on exam today.  She walks with a cane.  She feels as though her balance has been worsening.  We discussed her attending physical therapy in the future to work on gait stability muscle strengthening.  Fibromyalgia: Her fibromyalgia has been flaring along  with her psoriatic arthritis.  She has been having increased muscle tenderness and muscle aches.  She has chronic insomnia that has been worsening due to her pain at night.  She also has worsening fatigue.  Chronic fatigue: Chronic and related to insomnia.  Primary insomnia: She has been having interrupted sleep at night due to the level of pain she has been in.  Other medical conditions are listed as follows:  History of diabetes mellitus   Dyslipidemia   Orders: No orders of the defined types were placed in this encounter.  No orders of the defined types were placed in this encounter.   Face-to-face time spent with patient was 30 minutes. Greater than 50% of time was spent in counseling and coordination of care.  Follow-Up Instructions: Return in about 3 months (around 11/29/2017) for Psoriatic arthritis, Osteoarthritis, Fibromyalgia.   Gearldine Bienenstock, PA-C  Note - This record has been created using Dragon software.  Chart creation errors have been sought, but may not always  have been located. Such creation errors do not reflect on  the standard of medical care.

## 2017-08-28 NOTE — Telephone Encounter (Signed)
Patient called stating she woke up extremely stiff and her knees and fingers are swollen.  Patient states she "just doesn't feel good all over" and is worried that the Cosentyx isn't working for her.  Patient is requesting a return call to discuss other medication options.

## 2017-08-29 ENCOUNTER — Telehealth: Payer: Self-pay | Admitting: Rheumatology

## 2017-08-29 ENCOUNTER — Telehealth: Payer: Self-pay | Admitting: Physician Assistant

## 2017-08-29 ENCOUNTER — Ambulatory Visit (INDEPENDENT_AMBULATORY_CARE_PROVIDER_SITE_OTHER): Payer: Medicare Other | Admitting: Physician Assistant

## 2017-08-29 ENCOUNTER — Encounter: Payer: Self-pay | Admitting: Physician Assistant

## 2017-08-29 VITALS — BP 135/73 | HR 77 | Resp 16 | Ht 66.5 in | Wt 267.0 lb

## 2017-08-29 DIAGNOSIS — E785 Hyperlipidemia, unspecified: Secondary | ICD-10-CM

## 2017-08-29 DIAGNOSIS — L408 Other psoriasis: Secondary | ICD-10-CM | POA: Diagnosis not present

## 2017-08-29 DIAGNOSIS — M19042 Primary osteoarthritis, left hand: Secondary | ICD-10-CM | POA: Diagnosis not present

## 2017-08-29 DIAGNOSIS — Z8639 Personal history of other endocrine, nutritional and metabolic disease: Secondary | ICD-10-CM

## 2017-08-29 DIAGNOSIS — F5101 Primary insomnia: Secondary | ICD-10-CM

## 2017-08-29 DIAGNOSIS — M19041 Primary osteoarthritis, right hand: Secondary | ICD-10-CM

## 2017-08-29 DIAGNOSIS — L4050 Arthropathic psoriasis, unspecified: Secondary | ICD-10-CM

## 2017-08-29 DIAGNOSIS — M797 Fibromyalgia: Secondary | ICD-10-CM | POA: Diagnosis not present

## 2017-08-29 DIAGNOSIS — Z96653 Presence of artificial knee joint, bilateral: Secondary | ICD-10-CM

## 2017-08-29 DIAGNOSIS — Z79899 Other long term (current) drug therapy: Secondary | ICD-10-CM | POA: Diagnosis not present

## 2017-08-29 DIAGNOSIS — R5382 Chronic fatigue, unspecified: Secondary | ICD-10-CM | POA: Diagnosis not present

## 2017-08-29 MED ORDER — LEFLUNOMIDE 10 MG PO TABS: ORAL_TABLET | ORAL | 0 refills | Status: DC

## 2017-08-29 NOTE — Telephone Encounter (Signed)
Thank you for notifying me.  I will contact the patient to discuss other treatment options.

## 2017-08-29 NOTE — Telephone Encounter (Signed)
You're Welcome

## 2017-08-29 NOTE — Patient Instructions (Addendum)
Standing Labs We placed an order today for your standing lab work.    Please come back and get your standing labs in 2 weeks x2, then 2 months, then every 3 months   We have open lab Monday through Friday from 8:30-11:30 AM and 1:30-4:00 PM  at the office of Dr. Shaili Deveshwar.   You may experience shorter wait times on Monday and Friday afternoons. The office is located at [address], [address], [place], [place] [address] No appointment is necessary.   Labs are drawn by [facility].  You may receive a bill from [facility] for your lab work. If you have any questions regarding directions or hours of operation,  please call [phone].      Leflunomide tablets What is this medicine? LEFLUNOMIDE (le FLOO na mide) is for rheumatoid arthritis. This medicine may be used for other purposes; ask your health care provider or pharmacist if you have questions. COMMON BRAND NAME(S): Arava What should I tell my health care provider before I take this medicine? They need to know if you have any of these conditions: -alcoholism -bone marrow problems -fever or infection -immune system problems -kidney disease -liver disease -an unusual or allergic reaction to leflunomide, teriflunomide, other medicines, lactose, foods, dyes, or preservatives -pregnant or trying to get pregnant -breast-feeding How should I use this medicine? Take this medicine by mouth with a full glass of water. Follow the directions on the prescription label. Take your medicine at regular intervals. Do not take your medicine more often than directed. Do not stop taking except on your doctor's advice. Talk to your pediatrician regarding the use of this medicine in children. Special care may be needed. Overdosage: If you think you have taken too much of this medicine contact a poison control center or emergency room at once. NOTE: This medicine is only for you. Do not share this medicine with others. What if I miss a dose? If  you miss a dose, take it as soon as you can. If it is almost time for your next dose, take only that dose. Do not take double or extra doses. What may interact with this medicine? Do not take this medicine with any of the following medications: -teriflunomide This medicine may also interact with the following medications: -charcoal -cholestyramine -methotrexate -NSAIDs, medicines for pain and inflammation, like ibuprofen or naproxen -phenytoin -rifampin -tolbutamide -vaccines -warfarin This list may not describe all possible interactions. Give your health care provider a list of all the medicines, herbs, non-prescription drugs, or dietary supplements you use. Also tell them if you smoke, drink alcohol, or use illegal drugs. Some items may interact with your medicine. What should I watch for while using this medicine? Visit your doctor or health care professional for regular checks on your progress. You will need frequent blood checks while you are receiving the medicine. If you get a cold or other infection while receiving this medicine, call your doctor or health care professional. Do not treat yourself. The medicine may increase your risk of getting an infection. If you are a woman who has the potential to become pregnant, discuss birth control options with your doctor or health care professional. You must not be pregnant, and you must be using a reliable form of birth control. The medicine may harm an unborn baby. Immediately call your doctor if you think you might be pregnant. Alcoholic drinks may increase possible damage to your liver. Do not drink alcohol while taking this medicine. What side effects may   I notice from receiving this medicine? Side effects that you should report to your doctor or health care professional as soon as possible: -allergic reactions like skin rash, itching or hives, swelling of the face, lips, or tongue -cough -difficulty breathing or shortness of  breath -fever, chills or any other sign of infection -redness, blistering, peeling or loosening of the skin, including inside the mouth -unusual bleeding or bruising -unusually weak or tired -vomiting -yellowing of eyes or skin Side effects that usually do not require medical attention (report to your doctor or health care professional if they continue or are bothersome): -diarrhea -hair loss -headache -nausea This list may not describe all possible side effects. Call your doctor for medical advice about side effects. You may report side effects to FDA at 1-800-FDA-1088. Where should I keep my medicine? Keep out of the reach of children. Store at room temperature between 15 and 30 degrees C (59 and 86 degrees F). Protect from moisture and light. Throw away any unused medicine after the expiration date. NOTE: This sheet is a summary. It may not cover all possible information. If you have questions about this medicine, talk to your doctor, pharmacist, or health care provider.  2018 Elsevier/Gold Standard (2013-02-03 10:53:11)    

## 2017-08-29 NOTE — Telephone Encounter (Signed)
Pt  called left VM unable to pay for new med

## 2017-08-30 ENCOUNTER — Telehealth: Payer: Self-pay | Admitting: Physician Assistant

## 2017-08-30 ENCOUNTER — Other Ambulatory Visit: Payer: Self-pay | Admitting: Physician Assistant

## 2017-08-30 MED ORDER — PREDNISONE 5 MG PO TABS: ORAL_TABLET | ORAL | 0 refills | Status: DC

## 2017-08-30 NOTE — Telephone Encounter (Signed)
I spoke to the patient to discuss medication options since she cannot afford to start on Arava.  We discussed switching her from Cosentyx to [place], but she would not like to make the switch at this time.  Due to her elevated LFTs we do not want to restart her on methotrexate at this time.  She would like to stay on Cosentyx 150 mg subcutaneous injections every other week.  I will send her in a prednisone taper starting at 20 mg tapering by 5 mg every 4 days.  Potential side effects were discussed.  She is advised to notify us if she continues to have joint pain, joint stiffness, joint swelling following the prednisone taper.  If she continues to have recurrent flares we will discuss starting her on [place] at her next visit.

## 2017-08-30 NOTE — Telephone Encounter (Signed)
Opened in error

## 2017-09-06 ENCOUNTER — Telehealth: Payer: Self-pay | Admitting: Rheumatology

## 2017-09-06 NOTE — Telephone Encounter (Signed)
Patient request refill on Cosentyx sent to [facility].

## 2017-09-12 ENCOUNTER — Telehealth: Payer: Self-pay | Admitting: Rheumatology

## 2017-09-12 NOTE — Telephone Encounter (Signed)
Last Visit: 08/29/17 Next Visit: 12/12/17 Labs: 08/27/17 WNL TB Gold: 05/28/17 Neg   Okay to refill per Dr. Corliss Skainseveshwar  Faxed to [facility].   Left message to advise patient next labs are due in October 2019

## 2017-09-12 NOTE — Telephone Encounter (Signed)
Patient called checking on the status of her Cosentyx that she gets through [facility].  Patient is also requesting a return call to let her know when she is due for her bloodwork.

## 2017-09-12 NOTE — Telephone Encounter (Signed)
See previous phone note.  

## 2017-09-13 ENCOUNTER — Telehealth: Payer: Self-pay | Admitting: *Deleted

## 2017-09-13 NOTE — Telephone Encounter (Signed)
Spoke with patient and she states she has spoke with [facility] and they state they do not have her prescription. Patient advised the prescription has been faxed over to them and may be in the processing stage and she may need to give them until Monday and call back. Patient will come to office to get a sample.

## 2017-11-05 ENCOUNTER — Ambulatory Visit: Payer: Medicare Other | Admitting: Physician Assistant

## 2017-11-28 NOTE — Progress Notes (Signed)
Office Visit Note  Patient: [name]             Date of Birth: 05-04-1950           MRN: [id number]             PCP: Jamal Collin, PA-C Referring: Jamal Collin, PA-C Visit Date: 12/12/2017 Occupation: @GUAROCC @  Subjective:  Pain in multiple joints  History of Present Illness: [name] is a 67 y.o. female with history of psoriatic arthritis, osteoarthritis, and fibromyalgia.  She is on Cosentyx 150 mg sq every 2 weeks.  She did not start on Arava due to the cost.  She has been having generalized pain and pain in all joints.  She reports her fatigue and pain have worsened.  She states her stiffness has been lasting all day. She has bilateral SI joint pain and bilateral plantar fasciitis.  She denies any achilles tendonitis.  She has scattered patches of psoriasis. She has right knee pain and intermittent joint swelling.  She has also noticed increased stiffness in both hands but is unsure if she has joint swelling.  She continues to have generalized muscle aches and tenderness due to fibromyalgia.  She takes methadone and hydrocodone for pain relief.    Activities of Daily Living:  Patient reports morning stiffness all day.   Patient Reports nocturnal pain.  Difficulty dressing/grooming: Reports Difficulty climbing stairs: Reports Difficulty getting out of chair: Reports Difficulty using hands for taps, buttons, cutlery, and/or writing: Reports  Review of Systems  Constitutional: Positive for fatigue.  HENT: Positive for mouth dryness. Negative for mouth sores, trouble swallowing, trouble swallowing and nose dryness.   Eyes: Negative for pain, redness, itching, visual disturbance and dryness.  Respiratory: Negative for cough, hemoptysis, shortness of breath, wheezing and difficulty breathing.   Cardiovascular: Negative for chest pain, palpitations, hypertension, irregular heartbeat and swelling in legs/feet.  Gastrointestinal: Negative for abdominal pain, blood  in stool, constipation, diarrhea, nausea and vomiting.  Endocrine: Negative for increased urination.  Genitourinary: Negative for painful urination and nocturia.  Musculoskeletal: Positive for arthralgias, joint pain, joint swelling and morning stiffness. Negative for myalgias, muscle weakness, muscle tenderness and myalgias.  Skin: Positive for rash. Negative for color change, pallor, hair loss, nodules/bumps, skin tightness, ulcers and sensitivity to sunlight.  Allergic/Immunologic: Negative for susceptible to infections.  Neurological: Negative for light-headedness, numbness, headaches and memory loss.  Hematological: Negative for swollen glands.  Psychiatric/Behavioral: Negative for depressed mood, confusion and sleep disturbance. The patient is not nervous/anxious.     PMFS History:  Patient Active Problem List   Diagnosis Date Noted  . Dyslipidemia 10/16/2016  . History of total knee replacement, bilateral 10/16/2016  . Primary osteoarthritis of both hands 10/16/2016  . History of diabetes mellitus 10/16/2016  . Other chronic pain 10/16/2016  . History of glaucoma 10/16/2016  . Psoriatic arthropathy (HCC) 07/06/2016  . Other psoriasis 07/06/2016  . High risk medications (not anticoagulants) long-term use 07/06/2016  . Fibromyalgia 07/06/2016  . Chronic fatigue 07/06/2016  . Primary insomnia 07/06/2016    Past Medical History:  Diagnosis Date  . Arthritis   . Cataract   . Diabetes mellitus without complication (HCC)   . Glaucoma   . Hypertension   . Vertigo     Family History  Problem Relation Age of Onset  . Cancer Sister   . Diabetes Sister   . COPD Brother   . Diabetes Brother    Past Surgical History:  Procedure Laterality  Date  . ABDOMINAL HYSTERECTOMY    . APPENDECTOMY    . CARPAL TUNNEL RELEASE    . CHOLECYSTECTOMY    . FEMUR FRACTURE SURGERY    . REPLACEMENT TOTAL KNEE BILATERAL    . TOTAL SHOULDER ARTHROPLASTY    . TRIGGER FINGER RELEASE    . TRIGGER  FINGER RELEASE Right 02/2017   3rd and 4th digits.   Social History   Social History Narrative  . Not on file    Objective: Vital Signs: BP 123/64 (BP Location: Left Arm, Patient Position: Sitting, Cuff Size: Normal)   Pulse 67   Resp 17   Ht 5' 6.5" (1.689 m)   Wt 279 lb (126.6 kg)   BMI 44.36 kg/m    Physical Exam  Constitutional: She is oriented to person, place, and time. She appears well-developed and well-nourished.  HENT:  Head: Normocephalic and atraumatic.  Eyes: Conjunctivae and EOM are normal.  Neck: Normal range of motion.  Cardiovascular: Normal rate, regular rhythm, normal heart sounds and intact distal pulses.  Pulmonary/Chest: Effort normal and breath sounds normal.  Abdominal: Soft. Bowel sounds are normal.  Lymphadenopathy:    She has no cervical adenopathy.  Neurological: She is alert and oriented to person, place, and time.  Skin: Skin is warm and dry. Capillary refill takes less than 2 seconds.  Psychiatric: She has a normal mood and affect. Her behavior is normal.  Nursing note and vitals reviewed.    Musculoskeletal Exam: Generalized hyperalgesia.  C-spine good range of motion. Bilateral SI joint tenderness. Shoulder joints, elbow joints, wrist joints, MCPs and PIPs and DIPs good range of motion with no synovitis.  Is complete fist formation bilaterally.  Hip joints, knee joints, ankle joints, MTPs, PIPs, DIPs good range of motion no synovitis.  No warmth or effusion bilateral knee joints.  No tenderness of ankle joints.  Pedal edema bilaterally.  No achilles tendonitis.  Bilateral plantar fasciitis.   CDAI Exam: CDAI Score: Not documented Patient Global Assessment: Not documented; Provider Global Assessment: Not documented Swollen: Not documented; Tender: Not documented Joint Exam   Not documented   There is currently no information documented on the homunculus. Go to the Rheumatology activity and complete the homunculus joint  exam.  Investigation: No additional findings.  Imaging: No results found.  Recent Labs: Lab Results  Component Value Date   WBC 7.8 08/27/2017   HGB 12.5 08/27/2017   PLT 265 08/27/2017   NA 140 08/27/2017   K 4.7 08/27/2017   CL 102 08/27/2017   CO2 31 08/27/2017   GLUCOSE 75 08/27/2017   BUN 13 08/27/2017   CREATININE 0.86 08/27/2017   BILITOT 0.5 08/27/2017   ALKPHOS 70 07/18/2016   AST 10 08/27/2017   ALT 7 08/27/2017   PROT 6.3 08/27/2017   ALBUMIN 3.8 07/18/2016   CALCIUM 8.8 08/27/2017   GFRAA 82 08/27/2017   QFTBGOLDPLUS NEGATIVE 05/28/2017    Speciality Comments: No specialty comments available.  Procedures:  No procedures performed Allergies: Aspirin; Bee venom; Cephalosporins; Clarithromycin; Codeine; Demerol; E-mycin [erythromycin base]; Etodolac; Gatifloxacin; Iodinated diagnostic agents; Levaquin  [levofloxacin in d5w]; Levofloxacin; Levofloxacin; Lyrica [pregabalin]; Meperidine; Morphine and related; Nsaids; Other; Phenothiazines; Shellfish-derived products; Tequin; Penicillins; and Sulfa drugs cross reactors   Assessment / Plan:     Visit Diagnoses: Psoriatic arthropathy (HCC): She has no synovitis or dactylitis.  She has bilateral SI joint tenderness and bilateral plantar fasciitis.  No achilles tendonitis.  She has generalized hyperalgesia.  She has pain in  multiple joints but no synovitis on exam.  She did not start on Arava after her last vitis due to cost. Her psoriatic arthritis is well controlled on Cosentyx 150 mg sq injections every 2 weeks.  She will continue on this current treatment regimen.  She was advised to notify us if she develops joint swelling.  She will follow up in the office in 5 months.   Other psoriasis: She has scattered psoriasis.   High risk medication use - cosentyx, arava. previously failed Humira and Enbrel.  She was on methotrexate in 2017 but it was discontinued due to elevated LFTs. CBC and CMP were drawn today to monitor  for drug toxicity. - Plan: CBC with Differential/Platelet, COMPLETE METABOLIC PANEL WITH GFR  Primary osteoarthritis of both hands: She has PIP and DIP synovial thickening consistent with osteoarthritis.  Joint protection and muscle strengthening were discussed.   History of total knee replacement, bilateral: She has chronic right knee pain and mild warmth on exam.  She has good ROM of both knee joints.    Fibromyalgia: She has generalized hyperalgesia on exam.  She continues to have generalized muscle aches and muscle tenderness.  She goes to pain management, and she is on Methadone and hydrocodone for pain relief.  She has chronic fatigue and insomnia.   Primary insomnia: She continues to have interrupted sleep at night due to pain.   Chronic fatigue: Chronic and related to insomnia.   Other medical conditions are listed as follows:   Dyslipidemia  History of diabetes mellitus   Orders: Orders Placed This Encounter  Procedures  . CBC with Differential/Platelet  . COMPLETE METABOLIC PANEL WITH GFR   No orders of the defined types were placed in this encounter.    Follow-Up Instructions: Return in about 5 months (around 05/13/2018) for Psoriatic arthritis, Osteoarthritis, Fibromyalgia.   Gearldine Bienenstock, PA-C   I examined and evaluated the patient with Sherron Ales PA.  Patient is clinically doing much better on Cosentyx.  She had no active synovitis on my exam  today.  Although she complains of some plantar fasciitis.  We will continue current regimen.  The plan of care was discussed as noted above.  Pollyann Savoy, MD  Note - This record has been created using Animal nutritionist.  Chart creation errors have been sought, but may not always  have been located. Such creation errors do not reflect on  the standard of medical care.

## 2017-12-02 ENCOUNTER — Ambulatory Visit: Payer: Medicare Other | Admitting: Rheumatology

## 2017-12-11 ENCOUNTER — Telehealth: Payer: Self-pay | Admitting: Rheumatology

## 2017-12-11 NOTE — Telephone Encounter (Signed)
Last Visit: 08/29/17 Next Visit: 12/12/17 Labs: 08/27/17 WNL TB Gold: 05/28/17 Neg   Okay to refill per Dr. Corliss Skains

## 2017-12-11 NOTE — Telephone Encounter (Signed)
Prescription faxed to [employer]  

## 2017-12-11 NOTE — Telephone Encounter (Signed)
Patient left a voicemail requesting prescription refill of Cosentyx that she receives through [employer] [facility].

## 2017-12-12 ENCOUNTER — Encounter: Payer: Self-pay | Admitting: Rheumatology

## 2017-12-12 ENCOUNTER — Ambulatory Visit (INDEPENDENT_AMBULATORY_CARE_PROVIDER_SITE_OTHER): Payer: Medicare Other | Admitting: Rheumatology

## 2017-12-12 VITALS — BP 123/64 | HR 67 | Resp 17 | Ht 66.5 in | Wt 279.0 lb

## 2017-12-12 DIAGNOSIS — L408 Other psoriasis: Secondary | ICD-10-CM | POA: Diagnosis not present

## 2017-12-12 DIAGNOSIS — F5101 Primary insomnia: Secondary | ICD-10-CM

## 2017-12-12 DIAGNOSIS — L4050 Arthropathic psoriasis, unspecified: Secondary | ICD-10-CM

## 2017-12-12 DIAGNOSIS — M797 Fibromyalgia: Secondary | ICD-10-CM

## 2017-12-12 DIAGNOSIS — M19041 Primary osteoarthritis, right hand: Secondary | ICD-10-CM

## 2017-12-12 DIAGNOSIS — Z96653 Presence of artificial knee joint, bilateral: Secondary | ICD-10-CM

## 2017-12-12 DIAGNOSIS — Z79899 Other long term (current) drug therapy: Secondary | ICD-10-CM | POA: Diagnosis not present

## 2017-12-12 DIAGNOSIS — M19042 Primary osteoarthritis, left hand: Secondary | ICD-10-CM

## 2017-12-12 DIAGNOSIS — E785 Hyperlipidemia, unspecified: Secondary | ICD-10-CM

## 2017-12-12 DIAGNOSIS — R5382 Chronic fatigue, unspecified: Secondary | ICD-10-CM

## 2017-12-12 DIAGNOSIS — Z8639 Personal history of other endocrine, nutritional and metabolic disease: Secondary | ICD-10-CM

## 2017-12-12 NOTE — Patient Instructions (Signed)
Standing Labs We placed an order today for your standing lab work.    Please come back and get your standing labs in January and every 3 months   We have open lab Monday through Friday from 8:30-11:30 AM and 1:30-4:00 PM  at the office of Dr. Shaili Deveshwar.   You may experience shorter wait times on Monday and Friday afternoons. The office is located at [address], [address], [place], [place] [address] No appointment is necessary.   Labs are drawn by [facility].  You may receive a bill from [facility] for your lab work. If you have any questions regarding directions or hours of operation,  please call [phone].   Just as a reminder please drink plenty of water prior to coming for your lab work. Thanks!   

## 2017-12-13 ENCOUNTER — Telehealth: Payer: Self-pay | Admitting: Rheumatology

## 2017-12-13 LAB — CBC WITH DIFFERENTIAL/PLATELET
BASOS ABS: 33 {cells}/uL (ref 0–200)
Basophils Relative: 0.4 %
EOS PCT: 2.9 %
Eosinophils Absolute: 238 cells/uL (ref 15–500)
HEMATOCRIT: 37 % (ref 35.0–45.0)
Hemoglobin: 12 g/dL (ref 11.7–15.5)
LYMPHS ABS: 3018 {cells}/uL (ref 850–3900)
MCH: 27.9 pg (ref 27.0–33.0)
MCHC: 32.4 g/dL (ref 32.0–36.0)
MCV: 86 fL (ref 80.0–100.0)
MPV: 10.3 fL (ref 7.5–12.5)
Monocytes Relative: 7.2 %
NEUTROS PCT: 52.7 %
Neutro Abs: 4321 cells/uL (ref 1500–7800)
PLATELETS: 259 10*3/uL (ref 140–400)
RBC: 4.3 10*6/uL (ref 3.80–5.10)
RDW: 12.4 % (ref 11.0–15.0)
TOTAL LYMPHOCYTE: 36.8 %
WBC: 8.2 10*3/uL (ref 3.8–10.8)
WBCMIX: 590 {cells}/uL (ref 200–950)

## 2017-12-13 LAB — COMPLETE METABOLIC PANEL WITH GFR
AG Ratio: 1.2 (calc) (ref 1.0–2.5)
ALBUMIN MSPROF: 3.6 g/dL (ref 3.6–5.1)
ALKALINE PHOSPHATASE (APISO): 76 U/L (ref 33–130)
ALT: 8 U/L (ref 6–29)
AST: 9 U/L — ABNORMAL LOW (ref 10–35)
BILIRUBIN TOTAL: 0.6 mg/dL (ref 0.2–1.2)
BUN: 10 mg/dL (ref 7–25)
CO2: 31 mmol/L (ref 20–32)
Calcium: 8.9 mg/dL (ref 8.6–10.4)
Chloride: 100 mmol/L (ref 98–110)
Creat: 0.8 mg/dL (ref 0.50–0.99)
GFR, Est African American: 88 mL/min/{1.73_m2} (ref >=60)
GFR, Est Non African American: 76 mL/min/{1.73_m2} (ref >=60)
GLUCOSE: 178 mg/dL — AB (ref 65–99)
Globulin: 2.9 g/dL (calc) (ref 1.9–3.7)
Potassium: 4.9 mmol/L (ref 3.5–5.3)
Sodium: 137 mmol/L (ref 135–146)
Total Protein: 6.5 g/dL (ref 6.1–8.1)

## 2017-12-13 NOTE — Telephone Encounter (Signed)
Patient advised prescription was faxed to [employer] on 12/11/17. Patient verbalized understanding.

## 2017-12-13 NOTE — Telephone Encounter (Signed)
Patient requesting a refill on Cosentyx sent to [facility].

## 2017-12-16 ENCOUNTER — Telehealth: Payer: Self-pay | Admitting: *Deleted

## 2017-12-16 NOTE — Progress Notes (Signed)
CBC WNL.  Glucose is 178. AST borderline low-9.

## 2017-12-16 NOTE — Telephone Encounter (Signed)
Contacted patient to discuss lab results. Patient states she is still having increased joint stiffness. Patient would like to know what else she may do. Patient will give her Cosentyx injection once it is delivered this week. Please advise.

## 2017-12-17 NOTE — Telephone Encounter (Signed)
Is she experiencing joint swelling?

## 2017-12-18 NOTE — Telephone Encounter (Signed)
Attempted to contact the patient and left message for patient contact the office.

## 2017-12-18 NOTE — Telephone Encounter (Addendum)
Patient has been schedule for an ultrasound on 12/19/17 at 1:30 pm.

## 2017-12-18 NOTE — Telephone Encounter (Signed)
Patient she is having swelling in her bilateral hands and her bilateral knees.

## 2017-12-18 NOTE — Telephone Encounter (Signed)
I spoke with Dr. Corliss Skains, and she would like to see if the patient would like to schedule an ultrasound of both hands to evaluate for synovitis since at her last visit there was no apparent synovitis on exam.  If she has active synovitis, we will need to further discuss treatment options.

## 2017-12-19 ENCOUNTER — Ambulatory Visit (INDEPENDENT_AMBULATORY_CARE_PROVIDER_SITE_OTHER): Payer: Medicare Other | Admitting: Rheumatology

## 2017-12-19 ENCOUNTER — Ambulatory Visit (INDEPENDENT_AMBULATORY_CARE_PROVIDER_SITE_OTHER): Payer: Self-pay

## 2017-12-19 DIAGNOSIS — M79642 Pain in left hand: Secondary | ICD-10-CM | POA: Diagnosis not present

## 2017-12-19 DIAGNOSIS — M79641 Pain in right hand: Secondary | ICD-10-CM | POA: Diagnosis not present

## 2018-01-06 ENCOUNTER — Telehealth: Payer: Self-pay | Admitting: Pharmacy Technician

## 2018-01-06 NOTE — Telephone Encounter (Signed)
Spoke to patient about [employer] renewal for 2020. She will come by office this week to complete application.  10:50 AM [name], CPhT

## 2018-01-07 NOTE — Telephone Encounter (Signed)
Patient's daughter picked up application and they will bring back to office next week.  3:52 PM Dorthula Nettlesachael N Yolande Skoda, CPhT

## 2018-01-09 NOTE — Telephone Encounter (Signed)
Patient brought by application, will fax in once MD signature has been received.  2:18 PM [name] [name] [name] CPhT

## 2018-02-18 ENCOUNTER — Telehealth: Payer: Self-pay

## 2018-02-18 NOTE — Telephone Encounter (Signed)
[employer], and  per Rep Fayrene FearingJames, they have patient's application and it is in process. Rep couldn't explain why patient was sent letter. [employer] will start processing renewal applications on 02/20/18.

## 2018-02-18 NOTE — Telephone Encounter (Signed)
Per [name] she left message to advise patient.

## 2018-02-18 NOTE — Telephone Encounter (Signed)
Patient left a voicemail stating she received a letter from [employer] that her application has not been received. Patient is requesting a return call.

## 2018-04-14 NOTE — Telephone Encounter (Signed)
Received fax from [employer], patient's renewal application has been APPROVED. Coverage dates are from 04/12/2018 to 04/13/2019.   Will send documents to [facility].   Phone# [phone] Fax# [phone]

## 2018-05-15 ENCOUNTER — Ambulatory Visit: Payer: Medicare Other | Admitting: Rheumatology

## 2018-06-09 NOTE — Progress Notes (Signed)
Virtual Visit via Telephone Note  I connected with [name] on 06/09/18 at  1:00 PM EDT by telephone and verified that I am speaking with the correct person using two identifiers.   I discussed the limitations, risks, security and privacy concerns of performing an evaluation and management service by telephone and the availability of in person appointments. I also discussed with the patient that there may be a patient responsible charge related to this service. The patient expressed understanding and agreed to proceed. This service was conducted via virtual visit.  Patient was unable to use Webex.  We reached her by telephone. The patient was located at home. I was located in my office.  Consent was obtained prior to the virtual visit and is aware of possible charges through their insurance for this visit.  The patient is an established patient.  Dr. Corliss Skainseveshwar, MD conducted the virtual visit and Sherron Alesaylor Geralene Afshar, PA-C acted as scribe during the service.  Office staff helped with scheduling follow up visits after the service was conducted.   CC: Joint stiffness   History of Present Illness: Patient is a 68 year old female with a past medical history of psoriatic arthritis, osteoarthritis, and fibromyalgia.  She is on Cosentyx 150 mg sq injections once every 14 days.  She did not start on Arava due to cost.  She has not had any psoriatic arthritis.  She is having stiffness in both knee replacementts and in her lower back.  She denies any joint pain or joint swelling.  She denies any psoriasis at this time. She denies any SI joint pain, achilles tendonitis, or plantar fasciitis.   She has chronic fatigue and insomnia.  She continues to have generalized muscle aches and stiffness due to fibromyalgia.   Review of Systems  Constitutional: Positive for malaise/fatigue. Negative for fever.  Eyes: Negative for photophobia, pain, discharge and redness.  Respiratory: Negative for cough, shortness of breath and  wheezing.   Cardiovascular: Negative for chest pain and palpitations.  Gastrointestinal: Negative for blood in stool, constipation and diarrhea.  Genitourinary: Negative for dysuria.  Musculoskeletal: Positive for myalgias. Negative for back pain, joint pain and neck pain.       +Joint stiffness   Skin: Negative for rash.  Neurological: Negative for dizziness and headaches.  Psychiatric/Behavioral: Negative for depression. The patient has insomnia. The patient is not nervous/anxious.      Observations/Objective: Physical Exam  Constitutional: She is oriented to person, place, and time.  Neurological: She is alert and oriented to person, place, and time.  Psychiatric: Mood, memory, affect and judgment normal.   Patient reports morning stiffness for 20 minutes.   Patient denies nocturnal pain.  Difficulty dressing/grooming: Denies Difficulty climbing stairs: Reports Difficulty getting out of chair: Reports Difficulty using hands for taps, buttons, cutlery, and/or writing: Denies  Assessment and Plan: Psoriatic arthropathy (HCC):  She has not had any recent psoriatic arthritis flares.She has no joint pain or joint swelling at this time.  She experiences stiffness in her lower back in the morning and stiffness in her knee joints that are replaced after sitting for a prolonged time. She has no SI joint pain, achilles tendonitis, or plantar fasciitis at this time.  No active psoriasis. She is on Cosentyx 300 mg sq injections every 30 days.   She did not start on Arava after her last vitis due to cost.  She will continue on Cosentyx monotherapy.  She was advised to notify us if she develops increased  joint pain or joint swelling.  She will follow up in 3 months.  Other psoriasis: She has no psoriasis at this time.   High risk medication use - Cosentyx 300 mg sq injections every 30 days.  She did not start on Arava due to cost.  She previously failed Humira and Enbrel.  She was on methotrexate  in 2017 but it was discontinued due to elevated LFTs.  She had CBC, CMP, and TB gold drawn today.  We will review these once they result. She was advised to hold Cosentyx if she develops an infection and to resume once the infection has cleared.  We discussed practicing social distancing and standard precautions recommended by the CDC.    Primary osteoarthritis of both hands:She has no hand pain or joint swelling at this time.   History of total knee replacement, bilateral: She has chronic pain and stiffness in both knees.  She has difficulty getting up from a chair and climbing steps.    Fibromyalgia: She continues to have generalized muscle aches and muscle tenderness due to fibromyalgia.  She is on Methadone and hydrocodone for pain relief.  She has chronic fatigue related to insomnia.  The importance of regular exercise and good sleep hygiene was discussed.   Primary insomnia: She has chronic insomnia.  Good sleep hygiene was discussed.   Chronic fatigue: Chronic and related to insomnia. She was encouraged to stay active and exercise on a regular basis.  Follow Up Instructions: She will follow up in 3 months.   Standing orders will be placed today.     I discussed the assessment and treatment plan with the patient. The patient was provided an opportunity to ask questions and all were answered. The patient agreed with the plan and demonstrated an understanding of the instructions.   The patient was advised to call back or seek an in-person evaluation if the symptoms worsen or if the condition fails to improve as anticipated.  I provided 25 minutes of non-face-to-face time during this encounter. Pollyann Savoy, MD   Scribed by-  Gearldine Bienenstock, PA-C

## 2018-06-10 ENCOUNTER — Telehealth: Payer: Self-pay | Admitting: Rheumatology

## 2018-06-10 DIAGNOSIS — L4050 Arthropathic psoriasis, unspecified: Secondary | ICD-10-CM

## 2018-06-10 NOTE — Telephone Encounter (Signed)
Patient needs a refill on Cosentyx sent to [facility].

## 2018-06-11 MED ORDER — SECUKINUMAB 150 MG/ML ~~LOC~~ SOAJ: 300.0000 mg | SUBCUTANEOUS | 0 refills | Status: DC

## 2018-06-11 NOTE — Telephone Encounter (Signed)
Last Visit: 12/12/17 Next Visit: 06/17/18 Labs: 12/16/17 CBC WNL. Glucose is 178. AST borderline low-9. TB Gold: 05/28/17 Neg   Left message to advise patient she is due to update labs.   Okay to refill per Dr. Corliss Skains

## 2018-06-12 ENCOUNTER — Telehealth: Payer: Self-pay | Admitting: Rheumatology

## 2018-06-12 NOTE — Telephone Encounter (Signed)
Patient called stating she was returning your call regarding labwork.  Patient states approximately 1 month ago she had labwork at [facility].  Patient states she is scheduled for virtual office visit on 06/17/18 and is checking if Dr. Corliss Skains needs her to have additional labwork before that appointment.

## 2018-06-13 ENCOUNTER — Telehealth: Payer: Self-pay | Admitting: *Deleted

## 2018-06-13 ENCOUNTER — Other Ambulatory Visit: Payer: Self-pay | Admitting: *Deleted

## 2018-06-13 DIAGNOSIS — L4050 Arthropathic psoriasis, unspecified: Secondary | ICD-10-CM

## 2018-06-13 NOTE — Telephone Encounter (Signed)
Attempted to contact the patient and left message for patient to call the office.  

## 2018-06-13 NOTE — Telephone Encounter (Signed)
Spoke with patient and advised patient we are unable to see the labs drawn at [facility]. Patient will go to [facility] in [facility] early next week to have her labs drawn. Lab orders released.

## 2018-06-13 NOTE — Telephone Encounter (Signed)
LMOM; returning your call

## 2018-06-13 NOTE — Addendum Note (Signed)
Addended by: Henriette Combs on: 06/13/2018 03:02 PM   Modules accepted: Orders

## 2018-06-16 ENCOUNTER — Telehealth: Payer: Self-pay | Admitting: Rheumatology

## 2018-06-16 NOTE — Telephone Encounter (Signed)
Called [employer] and was able to provide a verbal prescription. They will get it processed and shipped out to patient ASAP.

## 2018-06-16 NOTE — Telephone Encounter (Signed)
LaDonna from [employer] patient assistance called stating they received our fax for patient's prescription refill of Cosentyx.  LaDonna states the name of the medication is missing and in its place are the instructions.  Please correct and refax to [phone]  If you have any questions, please call #[phone]

## 2018-06-17 ENCOUNTER — Other Ambulatory Visit: Payer: Self-pay

## 2018-06-17 ENCOUNTER — Telehealth (INDEPENDENT_AMBULATORY_CARE_PROVIDER_SITE_OTHER): Payer: Medicare Other | Admitting: Rheumatology

## 2018-06-17 ENCOUNTER — Encounter: Payer: Self-pay | Admitting: Rheumatology

## 2018-06-17 DIAGNOSIS — M797 Fibromyalgia: Secondary | ICD-10-CM

## 2018-06-17 DIAGNOSIS — Z79899 Other long term (current) drug therapy: Secondary | ICD-10-CM

## 2018-06-17 DIAGNOSIS — E785 Hyperlipidemia, unspecified: Secondary | ICD-10-CM

## 2018-06-17 DIAGNOSIS — L408 Other psoriasis: Secondary | ICD-10-CM | POA: Diagnosis not present

## 2018-06-17 DIAGNOSIS — Z96653 Presence of artificial knee joint, bilateral: Secondary | ICD-10-CM

## 2018-06-17 DIAGNOSIS — M19041 Primary osteoarthritis, right hand: Secondary | ICD-10-CM

## 2018-06-17 DIAGNOSIS — R5382 Chronic fatigue, unspecified: Secondary | ICD-10-CM

## 2018-06-17 DIAGNOSIS — M19042 Primary osteoarthritis, left hand: Secondary | ICD-10-CM

## 2018-06-17 DIAGNOSIS — L4050 Arthropathic psoriasis, unspecified: Secondary | ICD-10-CM | POA: Diagnosis not present

## 2018-06-17 DIAGNOSIS — F5101 Primary insomnia: Secondary | ICD-10-CM

## 2018-06-17 DIAGNOSIS — Z8639 Personal history of other endocrine, nutritional and metabolic disease: Secondary | ICD-10-CM

## 2018-06-18 NOTE — Progress Notes (Signed)
Elevated LFTs and Glucose. Pt is on statins. Pl fax results to her PCP.

## 2018-06-19 LAB — CBC WITH DIFFERENTIAL/PLATELET
Absolute Monocytes: 549 cells/uL (ref 200–950)
Basophils Absolute: 37 cells/uL (ref 0–200)
Basophils Relative: 0.4 %
Eosinophils Absolute: 223 cells/uL (ref 15–500)
Eosinophils Relative: 2.4 %
HCT: 37.8 % (ref 35.0–45.0)
Hemoglobin: 12 g/dL (ref 11.7–15.5)
Lymphs Abs: 2678 cells/uL (ref 850–3900)
MCH: 27.9 pg (ref 27.0–33.0)
MCHC: 31.7 g/dL — ABNORMAL LOW (ref 32.0–36.0)
MCV: 87.9 fL (ref 80.0–100.0)
MPV: 10.6 fL (ref 7.5–12.5)
Monocytes Relative: 5.9 %
Neutro Abs: 5813 cells/uL (ref 1500–7800)
Neutrophils Relative %: 62.5 %
Platelets: 300 10*3/uL (ref 140–400)
RBC: 4.3 10*6/uL (ref 3.80–5.10)
RDW: 12.6 % (ref 11.0–15.0)
Total Lymphocyte: 28.8 %
WBC: 9.3 10*3/uL (ref 3.8–10.8)

## 2018-06-19 LAB — COMPLETE METABOLIC PANEL WITH GFR
AG Ratio: 1.2 (calc) (ref 1.0–2.5)
ALT: 42 U/L — ABNORMAL HIGH (ref 6–29)
AST: 41 U/L — ABNORMAL HIGH (ref 10–35)
Albumin: 3.7 g/dL (ref 3.6–5.1)
Alkaline phosphatase (APISO): 149 U/L (ref 37–153)
BUN: 16 mg/dL (ref 7–25)
CO2: 31 mmol/L (ref 20–32)
Calcium: 9.5 mg/dL (ref 8.6–10.4)
Chloride: 100 mmol/L (ref 98–110)
Creat: 0.79 mg/dL (ref 0.50–0.99)
GFR, Est African American: 90 mL/min/{1.73_m2} (ref >=60)
GFR, Est Non African American: 77 mL/min/{1.73_m2} (ref >=60)
Globulin: 3 g/dL (calc) (ref 1.9–3.7)
Glucose, Bld: 209 mg/dL — ABNORMAL HIGH (ref 65–139)
Potassium: 5.3 mmol/L (ref 3.5–5.3)
Sodium: 138 mmol/L (ref 135–146)
Total Bilirubin: 0.4 mg/dL (ref 0.2–1.2)
Total Protein: 6.7 g/dL (ref 6.1–8.1)

## 2018-06-19 LAB — QUANTIFERON-TB GOLD PLUS
Mitogen-NIL: 8.71 IU/mL
NIL: 0.03 IU/mL
QuantiFERON-TB Gold Plus: NEGATIVE
TB1-NIL: 0 IU/mL
TB2-NIL: 0 IU/mL

## 2018-08-20 ENCOUNTER — Telehealth: Payer: Self-pay | Admitting: Rheumatology

## 2018-08-20 MED ORDER — COSENTYX SENSOREADY PEN 150 MG/ML ~~LOC~~ SOAJ: 300.0000 mg | SUBCUTANEOUS | 0 refills | Status: DC

## 2018-08-20 NOTE — Telephone Encounter (Signed)
Patient called requesting prescription refill of Cosentyx to be sent to [employer] patient assistance.   

## 2018-08-20 NOTE — Telephone Encounter (Signed)
Last Visit: 12/13/18 Next visit: 09/17/18 Labs: 06/17/18 Elevated LFTs and Glucose.  TB Gold: 06/17/18 Neg   Okay to refill per Dr. Estanislado Pandy

## 2018-09-01 HISTORY — PX: TIBIA FRACTURE SURGERY: SHX806

## 2018-09-01 HISTORY — PX: FIBULA FRACTURE SURGERY: SHX947

## 2018-09-04 NOTE — Progress Notes (Deleted)
Office Visit Note  Patient: [name]             Date of Birth: 1950-02-23           MRN: [id number]             PCP: Ardith Dark, PA-C Referring: Ardith Dark, PA-C Visit Date: 09/17/2018 Occupation: @GUAROCC @  Subjective:  No chief complaint on file.   History of Present Illness: [name] is a 68 y.o. female ***   Activities of Daily Living:  Patient reports morning stiffness for *** {minute/hour:19697}.   Patient {ACTIONS;DENIES/REPORTS:21021675::"Denies"} nocturnal pain.  Difficulty dressing/grooming: {ACTIONS;DENIES/REPORTS:21021675::"Denies"} Difficulty climbing stairs: {ACTIONS;DENIES/REPORTS:21021675::"Denies"} Difficulty getting out of chair: {ACTIONS;DENIES/REPORTS:21021675::"Denies"} Difficulty using hands for taps, buttons, cutlery, and/or writing: {ACTIONS;DENIES/REPORTS:21021675::"Denies"}  No Rheumatology ROS completed.   PMFS History:  Patient Active Problem List   Diagnosis Date Noted  . Dyslipidemia 10/16/2016  . History of total knee replacement, bilateral 10/16/2016  . Primary osteoarthritis of both hands 10/16/2016  . History of diabetes mellitus 10/16/2016  . Other chronic pain 10/16/2016  . History of glaucoma 10/16/2016  . Psoriatic arthropathy ([place]) 07/06/2016  . Other psoriasis 07/06/2016  . High risk medications (not anticoagulants) long-term use 07/06/2016  . Fibromyalgia 07/06/2016  . Chronic fatigue 07/06/2016  . Primary insomnia 07/06/2016    Past Medical History:  Diagnosis Date  . Arthritis   . Cataract   . Diabetes mellitus without complication ([place])   . Glaucoma   . Hypertension   . Vertigo     Family History  Problem Relation Age of Onset  . Cancer Sister   . Diabetes Sister   . COPD Brother   . Diabetes Brother    Past Surgical History:  Procedure Laterality Date  . ABDOMINAL HYSTERECTOMY    . APPENDECTOMY    . CARPAL TUNNEL RELEASE    . CHOLECYSTECTOMY    . FEMUR FRACTURE SURGERY    .  REPLACEMENT TOTAL KNEE BILATERAL    . TOTAL SHOULDER ARTHROPLASTY    . TRIGGER FINGER RELEASE    . TRIGGER FINGER RELEASE Right 02/2017   3rd and 4th digits.   Social History   Social History Narrative  . Not on file    There is no immunization history on file for this patient.   Objective: Vital Signs: There were no vitals taken for this visit.   Physical Exam   Musculoskeletal Exam: ***  CDAI Exam: CDAI Score: - Patient Global: -; Provider Global: - Swollen: -; Tender: - Joint Exam   No joint exam has been documented for this visit   There is currently no information documented on the homunculus. Go to the Rheumatology activity and complete the homunculus joint exam.  Investigation: No additional findings.  Imaging: No results found.  Recent Labs: Lab Results  Component Value Date   WBC 9.3 06/17/2018   HGB 12.0 06/17/2018   PLT 300 06/17/2018   NA 138 06/17/2018   K 5.3 06/17/2018   CL 100 06/17/2018   CO2 31 06/17/2018   GLUCOSE 209 (H) 06/17/2018   BUN 16 06/17/2018   CREATININE 0.79 06/17/2018   BILITOT 0.4 06/17/2018   ALKPHOS 70 07/18/2016   AST 41 (H) 06/17/2018   ALT 42 (H) 06/17/2018   PROT 6.7 06/17/2018   ALBUMIN 3.8 07/18/2016   CALCIUM 9.5 06/17/2018   GFRAA 90 06/17/2018   QFTBGOLDPLUS NEGATIVE 06/17/2018    Speciality Comments: No specialty comments available.  Procedures:  No procedures performed Allergies: Aspirin,  Bee venom, Cephalosporins, Clarithromycin, Codeine, Demerol, E-mycin [erythromycin base], Etodolac, Gatifloxacin, Iodinated diagnostic agents, Levaquin  [levofloxacin in d5w], Levofloxacin, Levofloxacin, Lyrica [pregabalin], Meperidine, Morphine and related, Nsaids, Other, Phenothiazines, Shellfish-derived products, Tequin, Penicillins, and Sulfa drugs cross reactors   Assessment / Plan:     Visit Diagnoses: No diagnosis found.  Orders: No orders of the defined types were placed in this encounter.  No orders of the  defined types were placed in this encounter.   Face-to-face time spent with patient was *** minutes. Greater than 50% of time was spent in counseling and coordination of care.  Follow-Up Instructions: No follow-ups on file.   Ellen HenriMarissa C Jorgen Wolfinger, CMA  Note - This record has been created using Animal nutritionistDragon software.  Chart creation errors have been sought, but may not always  have been located. Such creation errors do not reflect on  the standard of medical care.

## 2018-09-17 ENCOUNTER — Ambulatory Visit: Payer: Medicare Other | Admitting: Rheumatology

## 2018-11-17 ENCOUNTER — Telehealth: Payer: Self-pay | Admitting: Rheumatology

## 2018-11-17 MED ORDER — COSENTYX SENSOREADY PEN 150 MG/ML ~~LOC~~ SOAJ: 300.0000 mg | SUBCUTANEOUS | 0 refills | Status: DC

## 2018-11-17 NOTE — Progress Notes (Signed)
Office Visit Note  Patient: [name]             Date of Birth: [birth year]           MRN: [id number]             PCP: Jamal CollinHedgecock, Suzanne, PA-C Referring: Rubye BeachHedgecock, Suzanne, PA-C Visit Date: 11/21/2018 Occupation: @GUAROCC @  Subjective:  Left leg pain   History of Present Illness: [name] is a 68 y.o. female with history of psoriatic arthritis, osteoarthritis, and fibromyalgia.  Patient is on Cosentyx 300 mg every days injections every 28 days.  She denies any recent psoriatic arthritis flares. Patient reports that she fractured her tibia and fibula about 3 months ago.  She has [facility]  She is currently in a wheelchair and is nonweightbearing.  She reports that she was discharged from the rehab center on 11/10/2018.  She will be following up with the orthopedist next week to determine if she will be able to start weightbearing exercises.  She reports she continues to have chronic pain in both knee replacements.  She has pain intermittently in both hands but denies any swelling at this time.  She denies any Achilles tendinitis or plantar fasciitis.  She denies any SI joint pain at this time.  She has small patches of psoriasis on extensor surfaces of both elbow joints and in her scalp occasionally.  She continues have generalized muscle aches and muscle tenderness due to fibromyalgia.  She has fatigue related to insomnia.  She experiences interrupted sleep at night.   Activities of Daily Living:  Patient reports joint stiffness all day Patient Reports nocturnal pain.  Difficulty dressing/grooming: Reports Difficulty climbing stairs: Reports Difficulty getting out of chair: Reports Difficulty using hands for taps, buttons, cutlery, and/or writing: Reports  Review of Systems  Constitutional: Positive for fatigue.  HENT: Positive for mouth dryness. Negative for mouth sores and nose dryness.   Eyes: Negative for pain, itching, visual  disturbance and dryness.  Respiratory: Negative for cough, hemoptysis, shortness of breath, wheezing and difficulty breathing.   Cardiovascular: Negative for chest pain, palpitations, hypertension and swelling in legs/feet.  Gastrointestinal: Positive for constipation. Negative for abdominal pain, blood in stool and diarrhea.  Endocrine: Negative for increased urination.  Genitourinary: Negative for difficulty urinating and painful urination.  Musculoskeletal: Positive for arthralgias, joint pain and morning stiffness. Negative for joint swelling, myalgias, muscle weakness, muscle tenderness and myalgias.  Skin: Negative for color change, pallor, rash, hair loss, nodules/bumps, skin tightness, ulcers and sensitivity to sunlight.  Allergic/Immunologic: Negative for susceptible to infections.  Neurological: Negative for light-headedness, headaches and memory loss.  Hematological: Negative for bruising/bleeding tendency and swollen glands.  Psychiatric/Behavioral: Negative for depressed mood, confusion and sleep disturbance. The patient is not nervous/anxious.     PMFS History:  Patient Active Problem List   Diagnosis Date Noted   Dyslipidemia 10/16/2016   History of total knee replacement, bilateral 10/16/2016   Primary osteoarthritis of both hands 10/16/2016   History of diabetes mellitus 10/16/2016   Other chronic pain 10/16/2016   History of glaucoma 10/16/2016   Psoriatic arthropathy ([facility]) 07/06/2016   Other psoriasis 07/06/2016   High risk medications (not anticoagulants) long-term use 07/06/2016   Fibromyalgia 07/06/2016   Chronic fatigue 07/06/2016   Primary insomnia 07/06/2016    Past Medical History:  Diagnosis Date   Arthritis    Cataract    Diabetes mellitus without complication ([facility])  Glaucoma    Hypertension    Vertigo     Family History  Problem Relation Age of Onset   Cancer Sister    Diabetes Sister    COPD Brother    Diabetes Brother     Past Surgical History:  Procedure Laterality Date   ABDOMINAL HYSTERECTOMY     APPENDECTOMY     CARPAL TUNNEL RELEASE     CHOLECYSTECTOMY     FEMUR FRACTURE SURGERY Right    FIBULA FRACTURE SURGERY Left 09/01/2018   REPLACEMENT TOTAL KNEE BILATERAL     TIBIA FRACTURE SURGERY Left 09/01/2018   TOTAL SHOULDER ARTHROPLASTY     TRIGGER FINGER RELEASE     TRIGGER FINGER RELEASE Right 02/2017   3rd and 4th digits.   Social History   Social History Narrative   Not on file    There is no immunization history on file for this patient.   Objective: Vital Signs: BP 99/63 (BP Location: Left Wrist, Patient Position: Sitting, Cuff Size: Normal)    Pulse 84    Resp 15    Ht 5' 6.5" (1.689 m)    Wt 270 lb (122.5 kg) Comment: per patient, patient in wheelchair.   BMI 42.93 kg/m    Physical Exam Vitals signs and nursing note reviewed.  Constitutional:      Appearance: She is well-developed.  HENT:     Head: Normocephalic and atraumatic.  Eyes:     Conjunctiva/sclera: Conjunctivae normal.  Neck:     Musculoskeletal: Normal range of motion.  Cardiovascular:     Rate and Rhythm: Normal rate and regular rhythm.     Heart sounds: Normal heart sounds.  Pulmonary:     Effort: Pulmonary effort is normal.     Breath sounds: Normal breath sounds.  Abdominal:     General: Bowel sounds are normal.     Palpations: Abdomen is soft.  Lymphadenopathy:     Cervical: No cervical adenopathy.  Skin:    General: Skin is warm and dry.     Capillary Refill: Capillary refill takes less than 2 seconds.  Neurological:     Mental Status: She is alert and oriented to person, place, and time.  Psychiatric:        Behavior: Behavior normal.      Musculoskeletal Exam: C-spine good range of motion.  Difficult to assess lumbar spine range of motion well wheelchair-bound.  Shoulder joints have good range of motion no discomfort.  Elbow joints, wrist joints, MCPs and PIPs, DIPs good range of  motion no synovitis.  She has mild PIP and DIP synovial thickening.  She has tenderness of all MCP and PIP joints but no synovitis or dactylitis noted.  Bilateral knee replacements have good range of motion.  No warmth or effusion was noted.  She has pedal edema bilaterally.  CDAI Exam: CDAI Score: -- Patient Global: --; Provider Global: -- Swollen: --; Tender: -- Joint Exam   No joint exam has been documented for this visit   There is currently no information documented on the homunculus. Go to the Rheumatology activity and complete the homunculus joint exam.  Investigation: No additional findings.  Imaging: No results found.  Recent Labs: Lab Results  Component Value Date   WBC 9.3 06/17/2018   HGB 12.0 06/17/2018   PLT 300 06/17/2018   NA 138 06/17/2018   K 5.3 06/17/2018   CL 100 06/17/2018   CO2 31 06/17/2018   GLUCOSE 209 (H) 06/17/2018   BUN 16  06/17/2018   CREATININE 0.79 06/17/2018   BILITOT 0.4 06/17/2018   ALKPHOS 70 07/18/2016   AST 41 (H) 06/17/2018   ALT 42 (H) 06/17/2018   PROT 6.7 06/17/2018   ALBUMIN 3.8 07/18/2016   CALCIUM 9.5 06/17/2018   GFRAA 90 06/17/2018   QFTBGOLDPLUS NEGATIVE 06/17/2018    Speciality Comments: No specialty comments available.  Procedures:  No procedures performed Allergies: Aspirin, Bee venom, Cephalosporins, Clarithromycin, Codeine, Demerol, E-mycin [erythromycin base], Etodolac, Gatifloxacin, Iodinated diagnostic agents, Levaquin  [levofloxacin in d5w], Levofloxacin, Levofloxacin, Lyrica [pregabalin], Meperidine, Morphine and related, Nsaids, Other, Phenothiazines, Shellfish-derived products, Tequin, Penicillins, and Sulfa drugs cross reactors   Assessment / Plan:     Visit Diagnoses: Psoriatic arthropathy ([place]): She has no synovitis or dactylitis on exam.  She has not had any recent psoriatic arthritis flares.  She experiences intermittent pain in both hands and bilateral knees which are replaced.  She has tenderness of  MCPs and PIPs but no inflammation. she has not had any Achilles tendinitis or plantar fasciitis.  No SI joint tenderness.  She has a few small patches of psoriasis on the extensor surface of both elbow joints and occasionally on her scalp.  She is on Cosentyx 300 mg subcutaneous injections every 28 days.  She has not missed any doses of Cosentyx recently.  She did not start on Athelstan and does not appear to needed at this time.  She will continue on Cosentyx monotherapy as prescribed.  She was advised to notify us if she develops increased joint pain or joint swelling.  She will follow-up in the office in 5 months.  Other psoriasis: She has small patches of psoriasis on extensor surface of both elbow joints.  She has occasional scalp psoriasis.  High risk medication use - Cosentyx 300 mg every 28 days. Last TB gold negative on 06/17/2018 and will monitor yearly.  Most recent CBC/CMP within normal limits except for elevated AST/ALT on 06/17/2018.  Due for CBC/CMP today and will monitor every 3 months. She failed Humira and Enbrel in the past.  She was on methotrexate in 2017 but it was discontinued due to elevated LFTs.  - Plan: CBC with Differential/Platelet, COMPLETE METABOLIC PANEL WITH GFR  Primary osteoarthritis of both hands: She has PIP and DIP synovial thickening consistent with osteoarthritis of both hands.  She has tenderness of all PIP joints.  No synovitis or dactylitis was noted.  Joint protection muscle strengthening were discussed.  History of total knee replacement, bilateral: She has chronic pain in both knee joints which are replaced.  No warmth or effusion noted.  She is currently wheelchair bound and non weight bearing following left tibia/fibula fracture about 3 months ago.   History of fracture of tibia: She has fractured her left tibia and fibula in July after tripping over some clothes in her room.  She states that when she fell she fell back and landed on her left leg which was bent  underneath her.  She had surgical fixation performed by Dr. Durene Romans.  She was discharged from rehab center on 11/10/2018.  She can continues to be in a wheelchair and is nonweightbearing.  History of fibula fracture: She fractured the tibia and fibula in July after a fall.  She had surgical correction and has been nonweightbearing since July.  She is currently in a wheelchair.  Fibromyalgia -She has generalized muscle aches and muscle tenderness. She is on Methadone and hydrocodone for pain relief.  She has chronic fatigue related to insomnia.  Chronic fatigue: Related to insomnia.   Primary insomnia: She has interrupted sleep at night.   Other medical conditions are listed as follows:   History of diabetes mellitus  Dyslipidemia   Orders: Orders Placed This Encounter  Procedures   CBC with Differential/Platelet   COMPLETE METABOLIC PANEL WITH GFR   No orders of the defined types were placed in this encounter.   Follow-Up Instructions: Return in about 5 months (around 04/21/2019) for Psoriatic arthritis, Osteoarthritis, Fibromyalgia.   Gearldine Bienenstock, PA-C   I examined and evaluated the patient with Sherron Ales PA.  Patient had a setback but left fibula and tibia fracture.  She continued Cosentyx and had no flare of psoriatic arthritis.  We will continue Cosentyx as planned.  The plan of care was discussed as noted above.  Pollyann Savoy, MD  Note - This record has been created using Animal nutritionist.  Chart creation errors have been sought, but may not always  have been located. Such creation errors do not reflect on  the standard of medical care.

## 2018-11-17 NOTE — Telephone Encounter (Signed)
Last Visit: 06/17/18 Next Visit: 11/21/18 Labs: 06/17/18 Elevated LFTs and Glucose TB Gold: 06/17/18 Neg   Patient to update labs at her appointment on 11/21/18  Okay to refill 30 day supply per Dr. Estanislado Pandy

## 2018-11-17 NOTE — Telephone Encounter (Signed)
Patient request a refill on Cosentyx sent to [facility].

## 2018-11-20 MED ORDER — COSENTYX SENSOREADY PEN 150 MG/ML ~~LOC~~ SOAJ: 300.0000 mg | SUBCUTANEOUS | 0 refills | Status: DC

## 2018-11-20 NOTE — Addendum Note (Signed)
Addended by: Carole Binning on: 11/20/2018 10:25 AM   Modules accepted: Orders

## 2018-11-21 ENCOUNTER — Ambulatory Visit (INDEPENDENT_AMBULATORY_CARE_PROVIDER_SITE_OTHER): Payer: Medicare Other | Admitting: Rheumatology

## 2018-11-21 ENCOUNTER — Encounter: Payer: Self-pay | Admitting: Rheumatology

## 2018-11-21 ENCOUNTER — Other Ambulatory Visit: Payer: Self-pay

## 2018-11-21 VITALS — BP 99/63 | HR 84 | Resp 15 | Ht 66.5 in | Wt 270.0 lb

## 2018-11-21 DIAGNOSIS — M19041 Primary osteoarthritis, right hand: Secondary | ICD-10-CM

## 2018-11-21 DIAGNOSIS — R5382 Chronic fatigue, unspecified: Secondary | ICD-10-CM

## 2018-11-21 DIAGNOSIS — Z96653 Presence of artificial knee joint, bilateral: Secondary | ICD-10-CM

## 2018-11-21 DIAGNOSIS — Z8781 Personal history of (healed) traumatic fracture: Secondary | ICD-10-CM

## 2018-11-21 DIAGNOSIS — L408 Other psoriasis: Secondary | ICD-10-CM

## 2018-11-21 DIAGNOSIS — F5101 Primary insomnia: Secondary | ICD-10-CM

## 2018-11-21 DIAGNOSIS — E785 Hyperlipidemia, unspecified: Secondary | ICD-10-CM

## 2018-11-21 DIAGNOSIS — L4050 Arthropathic psoriasis, unspecified: Secondary | ICD-10-CM

## 2018-11-21 DIAGNOSIS — Z8639 Personal history of other endocrine, nutritional and metabolic disease: Secondary | ICD-10-CM

## 2018-11-21 DIAGNOSIS — M19042 Primary osteoarthritis, left hand: Secondary | ICD-10-CM

## 2018-11-21 DIAGNOSIS — Z79899 Other long term (current) drug therapy: Secondary | ICD-10-CM | POA: Diagnosis not present

## 2018-11-21 DIAGNOSIS — M797 Fibromyalgia: Secondary | ICD-10-CM

## 2018-11-22 LAB — CBC WITH DIFFERENTIAL/PLATELET
Absolute Monocytes: 625 cells/uL (ref 200–950)
Basophils Absolute: 26 cells/uL (ref 0–200)
Basophils Relative: 0.3 %
Eosinophils Absolute: 202 cells/uL (ref 15–500)
Eosinophils Relative: 2.3 %
HCT: 43.7 % (ref 35.0–45.0)
Hemoglobin: 13.4 g/dL (ref 11.7–15.5)
Lymphs Abs: 3256 cells/uL (ref 850–3900)
MCH: 26.3 pg — ABNORMAL LOW (ref 27.0–33.0)
MCHC: 30.7 g/dL — ABNORMAL LOW (ref 32.0–36.0)
MCV: 85.9 fL (ref 80.0–100.0)
MPV: 10.6 fL (ref 7.5–12.5)
Monocytes Relative: 7.1 %
Neutro Abs: 4690 cells/uL (ref 1500–7800)
Neutrophils Relative %: 53.3 %
Platelets: 302 10*3/uL (ref 140–400)
RBC: 5.09 10*6/uL (ref 3.80–5.10)
RDW: 13 % (ref 11.0–15.0)
Total Lymphocyte: 37 %
WBC: 8.8 10*3/uL (ref 3.8–10.8)

## 2018-11-22 LAB — COMPLETE METABOLIC PANEL WITH GFR
AG Ratio: 0.9 (calc) — ABNORMAL LOW (ref 1.0–2.5)
ALT: 50 U/L — ABNORMAL HIGH (ref 6–29)
AST: 33 U/L (ref 10–35)
Albumin: 3.3 g/dL — ABNORMAL LOW (ref 3.6–5.1)
Alkaline phosphatase (APISO): 159 U/L — ABNORMAL HIGH (ref 37–153)
BUN: 13 mg/dL (ref 7–25)
CO2: 31 mmol/L (ref 20–32)
Calcium: 9.5 mg/dL (ref 8.6–10.4)
Chloride: 96 mmol/L — ABNORMAL LOW (ref 98–110)
Creat: 0.97 mg/dL (ref 0.50–0.99)
GFR, Est African American: 70 mL/min/{1.73_m2} (ref >=60)
GFR, Est Non African American: 60 mL/min/{1.73_m2} (ref >=60)
Globulin: 3.5 g/dL (calc) (ref 1.9–3.7)
Glucose, Bld: 288 mg/dL — ABNORMAL HIGH (ref 65–99)
Potassium: 4.7 mmol/L (ref 3.5–5.3)
Sodium: 136 mmol/L (ref 135–146)
Total Bilirubin: 0.4 mg/dL (ref 0.2–1.2)
Total Protein: 6.8 g/dL (ref 6.1–8.1)

## 2018-11-24 NOTE — Progress Notes (Signed)
Labs are stable.

## 2018-12-16 ENCOUNTER — Telehealth: Payer: Self-pay | Admitting: Rheumatology

## 2018-12-16 MED ORDER — COSENTYX SENSOREADY PEN 150 MG/ML ~~LOC~~ SOAJ: 300.0000 mg | SUBCUTANEOUS | 2 refills | Status: DC

## 2018-12-16 NOTE — Telephone Encounter (Signed)
Patient called requesting prescription refill of Cosentyx to be sent to [employer] patient assistance program.

## 2018-12-16 NOTE — Telephone Encounter (Signed)
Last Visit: 11/21/18 Next Visit: 04/21/19 Labs: 11/21/19 stable  TB Gold: 06/17/18 Neg   Okay to refill per Dr. Estanislado Pandy

## 2019-02-25 ENCOUNTER — Other Ambulatory Visit: Payer: Self-pay | Admitting: *Deleted

## 2019-02-25 ENCOUNTER — Telehealth: Payer: Self-pay | Admitting: *Deleted

## 2019-02-25 MED ORDER — COSENTYX SENSOREADY PEN 150 MG/ML ~~LOC~~ SOAJ: 300.0000 mg | SUBCUTANEOUS | 2 refills | Status: DC

## 2019-02-25 NOTE — Telephone Encounter (Signed)
error 

## 2019-03-11 ENCOUNTER — Telehealth: Payer: Self-pay | Admitting: Pharmacy Technician

## 2019-03-11 NOTE — Telephone Encounter (Signed)
Faxed patient's renewal application to [employer].  Fax# [phone] Phone# [phone]  9:03 AM Dorthula Nettles, CPhT

## 2019-04-07 NOTE — Telephone Encounter (Signed)
Received a fax from  [employer] regarding an approval for COSENTYX from 04/07/2019 to 02/19/2020.   Patient ID: [id number]  Will send document to scan center.  Verlin Fester, PharmD, [place], CPP Clinical Specialty Pharmacist [phone]  04/07/2019 1:45 PM

## 2019-04-15 NOTE — Progress Notes (Signed)
Office Visit Note  Patient: [name]             Date of Birth: [birth year]           MRN: [id number]             PCP: Jamal Collin, PA-C Referring: Jamal Collin, PA-C Visit Date: 04/21/2019 Occupation: @GUAROCC @  Subjective:  Left knee pain   History of Present Illness: LASHAWNTA [name] is a 69 y.o. female with history of psoriatic arthritis and osteoarthritis.  Patient is on Cosentyx 300 mg sq injections every 28 days.  She has not missed any doses of Cosentyx recently.  She denies any recent psoriatic arthritis flares.  She continues to have chronic pain in the left knee joint which is replaced.  She had a fall in July 2020 and had to have surgery after that.  She continues to go to physical therapy 3 times a week.  She has been using a walker to assist with ambulation.  She continues to have difficulty getting up from a seated position and climbing steps due to the discomfort.  She denies any other joint pain or joint swelling at this time.  She denies any SI joint pain.  She denies any Achilles tendinitis or plantar fasciitis.  She denies any active psoriasis.  She continues have generalized muscle aches and muscle tenderness due to fibromyalgia.  She has chronic fatigue secondary to insomnia.  She is having difficulty exercising outside of physical therapy due to the discomfort in her left knee.  She states that her PCP has been ordering her bone densities and she was started on Evenity last week.   Activities of Daily Living:  Patient reports morning stiffness for 1  hour.   Patient Reports nocturnal pain.  Difficulty dressing/grooming: Denies Difficulty climbing stairs: Reports Difficulty getting out of chair: Reports Difficulty using hands for taps, buttons, cutlery, and/or writing: Denies  Review of Systems  Constitutional: Positive for fatigue.  HENT: Positive for mouth dryness. Negative for mouth sores and nose dryness.   Eyes: Negative for pain, visual  disturbance and dryness.  Respiratory: Negative for cough, hemoptysis, shortness of breath and difficulty breathing.   Cardiovascular: Negative for chest pain, palpitations, hypertension and swelling in legs/feet.  Gastrointestinal: Negative for blood in stool, constipation and diarrhea.  Endocrine: Negative for increased urination.  Genitourinary: Negative for painful urination.  Musculoskeletal: Positive for arthralgias, joint pain, myalgias, morning stiffness, muscle tenderness and myalgias. Negative for joint swelling and muscle weakness.  Skin: Negative for color change, pallor, rash, hair loss, nodules/bumps, skin tightness, ulcers and sensitivity to sunlight.  Allergic/Immunologic: Negative for susceptible to infections.  Neurological: Negative for dizziness, numbness, headaches and weakness.  Hematological: Negative for swollen glands.  Psychiatric/Behavioral: Positive for sleep disturbance. Negative for depressed mood. The patient is not nervous/anxious.     PMFS History:  Patient Active Problem List   Diagnosis Date Noted  . Dyslipidemia 10/16/2016  . History of total knee replacement, bilateral 10/16/2016  . Primary osteoarthritis of both hands 10/16/2016  . History of diabetes mellitus 10/16/2016  . Other chronic pain 10/16/2016  . History of glaucoma 10/16/2016  . Psoriatic arthropathy (HCC) 07/06/2016  . Other psoriasis 07/06/2016  . High risk medications (not anticoagulants) long-term use 07/06/2016  . Fibromyalgia 07/06/2016  . Chronic fatigue 07/06/2016  . Primary insomnia 07/06/2016    Past Medical History:  Diagnosis Date  . Arthritis   . Cataract   . Diabetes mellitus without complication (  HCC)   . Glaucoma   . Hypertension   . Vertigo     Family History  Problem Relation Age of Onset  . Cancer Sister   . Diabetes Sister   . COPD Brother   . Diabetes Brother    Past Surgical History:  Procedure Laterality Date  . ABDOMINAL HYSTERECTOMY    .  APPENDECTOMY    . CARPAL TUNNEL RELEASE    . CHOLECYSTECTOMY    . FEMUR FRACTURE SURGERY Right   . FIBULA FRACTURE SURGERY Left 09/01/2018  . REPLACEMENT TOTAL KNEE BILATERAL    . TIBIA FRACTURE SURGERY Left 09/01/2018  . TOTAL SHOULDER ARTHROPLASTY    . TRIGGER FINGER RELEASE    . TRIGGER FINGER RELEASE Right 02/2017   3rd and 4th digits.   Social History   Social History Narrative  . Not on file    There is no immunization history on file for this patient.   Objective: Vital Signs: BP 135/63 (BP Location: Left Arm, Patient Position: Sitting, Cuff Size: Large)   Pulse 64   Resp 14   Ht 5' 6.5" (1.689 m)   Wt 250 lb (113.4 kg) Comment: per patient  BMI 39.75 kg/m    Physical Exam Vitals and nursing note reviewed.  Constitutional:      Appearance: She is well-developed.  HENT:     Head: Normocephalic and atraumatic.  Eyes:     Conjunctiva/sclera: Conjunctivae normal.  Pulmonary:     Effort: Pulmonary effort is normal.  Abdominal:     General: Bowel sounds are normal.     Palpations: Abdomen is soft.  Musculoskeletal:     Cervical back: Normal range of motion.  Lymphadenopathy:     Cervical: No cervical adenopathy.  Skin:    General: Skin is warm and dry.     Capillary Refill: Capillary refill takes less than 2 seconds.  Neurological:     Mental Status: She is alert and oriented to person, place, and time.  Psychiatric:        Behavior: Behavior normal.      Musculoskeletal Exam: C-spine good range of motion.  Thoracic kyphosis noted.  Shoulder joints have good range of motion with no discomfort.  Elbow joints have good range of motion with no tenderness or synovitis.  Wrist joints have good range of motion with no tenderness or synovitis.  MCPs, PIPs, DIPs good range of motion no synovitis.  She has complete fist formation bilaterally.  Hip joints difficult to assess well in wheelchair.  Right knee replacement has good range of motion with no warmth or effusion.   Left knee has limited range of motion with discomfort.  She has pedal edema bilaterally.  No Achilles tendinitis or plantar fasciitis.  CDAI Exam: CDAI Score: -- Patient Global: --; Provider Global: -- Swollen: --; Tender: -- Joint Exam 04/21/2019   No joint exam has been documented for this visit   There is currently no information documented on the homunculus. Go to the Rheumatology activity and complete the homunculus joint exam.  Investigation: No additional findings.  Imaging: No results found.  Recent Labs: Lab Results  Component Value Date   WBC 8.8 11/21/2018   HGB 13.4 11/21/2018   PLT 302 11/21/2018   NA 136 11/21/2018   K 4.7 11/21/2018   CL 96 (L) 11/21/2018   CO2 31 11/21/2018   GLUCOSE 288 (H) 11/21/2018   BUN 13 11/21/2018   CREATININE 0.97 11/21/2018   BILITOT 0.4 11/21/2018  ALKPHOS 70 07/18/2016   AST 33 11/21/2018   ALT 50 (H) 11/21/2018   PROT 6.8 11/21/2018   ALBUMIN 3.8 07/18/2016   CALCIUM 9.5 11/21/2018   GFRAA 70 11/21/2018   QFTBGOLDPLUS NEGATIVE 06/17/2018    Speciality Comments: No specialty comments available.  Procedures:  No procedures performed Allergies: Aspirin, Bee venom, Cephalosporins, Clarithromycin, Codeine, Demerol, E-mycin [erythromycin base], Etodolac, Gatifloxacin, Iodinated diagnostic agents, Levaquin  [levofloxacin in d5w], Levofloxacin, Levofloxacin, Lyrica [pregabalin], Meperidine, Morphine and related, Nsaids, Other, Phenothiazines, Shellfish-derived products, Tequin, Penicillins, and Sulfa drugs cross reactors   Assessment / Plan:     Visit Diagnoses: Psoriatic arthropathy (HCC): She has no synovitis or dactylitis on exam.  She has not had any recent psoriatic arthritis flares.  She is clinically doing well on Cosentyx 300 mg subcutaneous injections every 28 days.  She has not missed any doses of Cosentyx recently.  She has chronic pain in the left lower extremity fracturing her tibia and fibula in July 2020.  She has  no other joint pain or joint swelling at this time.  No Achilles tendinitis or plantar fasciitis.  No SI joint tenderness.  She has no active psoriasis at this time.  She will continue on Cosentyx 300 mg every days injections every 28 days.  She was advised to notify us if she develops increased joint pain or joint swelling.  She will follow-up in the office in 5 months.  Other psoriasis: She has no active psoriasis at this time  High risk medication use - Cosentyx 300 mg sq injections every 28 days.  CBC and CMP were drawn on 11/21/2018.  She is due to update lab work today.  Orders were released.  TB gold was negative on 06/17/2018.  A future order for TB gold was placed today.  She was encouraged to receive the COVID-19 vaccinations but she is apprehensive at this time.  We discussed the importance of holding Cosentyx if she develops any signs or symptoms of an infection and to resume once the infection is completely cleared- Plan: CBC with Differential/Platelet, COMPLETE METABOLIC PANEL WITH GFR, QuantiFERON-TB Gold Plus  Primary osteoarthritis of both hands: She has no tenderness or synovitis on exam.  She has complete fist formation bilaterally.  She has not had any discomfort or stiffness in her hands recently.  Joint protection and muscle strengthening were discussed  History of total knee replacement, bilateral: Right knee replacement has good range of motion with no discomfort.  Left knee has a limited range of motion with discomfort.  She continues to go to physical therapy 3 times a week status post tibia-fibula fracture in July 2020.  She uses a walker to assist with ambulation.  History of fracture of tibia - She had surgical fixation performed by Dr. Earna Coder.  She was discharged from rehab center on 11/10/2018.   She continues to go to physical therapy 3 times a week.  History of fibula fracture - She fractured the tibia and fibula in July 2020 after a fall.  She continues to go to physical  therapy 3 times a week and uses a walker to assist with ambulation.  Fibromyalgia - She has generalized hyperalgesia and positive tender points on exam.  She continues have generalized muscle aches muscle tenderness due to fibromyalgia.  She is been experiencing increased myalgias with the cooler weather temperatures.  She is on Methadone and hydrocodone for pain relief.  He has been going to physical therapy 3 times a week which has been  extensive for exercise.  She continues to have chronic fatigue secondary to insomnia.  Chronic fatigue: She has chronic fatigue secondary to insomnia.  Discussed importance of regular exercise and good sleep hygiene.  Primary insomnia: She has chronic insomnia.  She continues to have interrupted sleep at night due to the discomfort she experiences in her left knee.  Age-related osteoporosis without current pathological fracture: According to the patient she was recently diagnosed with osteoporosis and was started on Evenity by her PCP.  Her first injection was last week.  We do not have records of her DEXA in epic to review.   History of diabetes mellitus  Dyslipidemia  Orders: Orders Placed This Encounter  Procedures  . CBC with Differential/Platelet  . COMPLETE METABOLIC PANEL WITH GFR  . QuantiFERON-TB Gold Plus   No orders of the defined types were placed in this encounter.     Follow-Up Instructions: Return in 5 months (on 09/21/2019) for Psoriatic arthritis, Osteoarthritis.   Ofilia Neas, PA-C   I examined and evaluated the patient with Hazel Sams PA.  Patient psoriatic arthritis is well controlled on Cosentyx.  She has been tolerating the medication well.  We will monitor labs.  She continues to have some warmth and discomfort in her left knee joint due to prior injury.  The plan of care was discussed as noted above.  Bo Merino, MD  Note - This record has been created using Editor, commissioning.  Chart creation errors have been sought,  but may not always  have been located. Such creation errors do not reflect on  the standard of medical care.

## 2019-04-21 ENCOUNTER — Other Ambulatory Visit: Payer: Self-pay

## 2019-04-21 ENCOUNTER — Ambulatory Visit (INDEPENDENT_AMBULATORY_CARE_PROVIDER_SITE_OTHER): Payer: Medicare Other | Admitting: Rheumatology

## 2019-04-21 ENCOUNTER — Encounter: Payer: Self-pay | Admitting: Rheumatology

## 2019-04-21 VITALS — BP 135/63 | HR 64 | Resp 14 | Ht 66.5 in | Wt 250.0 lb

## 2019-04-21 DIAGNOSIS — Z8781 Personal history of (healed) traumatic fracture: Secondary | ICD-10-CM

## 2019-04-21 DIAGNOSIS — Z79899 Other long term (current) drug therapy: Secondary | ICD-10-CM | POA: Diagnosis not present

## 2019-04-21 DIAGNOSIS — M810 Age-related osteoporosis without current pathological fracture: Secondary | ICD-10-CM

## 2019-04-21 DIAGNOSIS — M19042 Primary osteoarthritis, left hand: Secondary | ICD-10-CM

## 2019-04-21 DIAGNOSIS — F5101 Primary insomnia: Secondary | ICD-10-CM

## 2019-04-21 DIAGNOSIS — M797 Fibromyalgia: Secondary | ICD-10-CM

## 2019-04-21 DIAGNOSIS — L408 Other psoriasis: Secondary | ICD-10-CM | POA: Diagnosis not present

## 2019-04-21 DIAGNOSIS — M19041 Primary osteoarthritis, right hand: Secondary | ICD-10-CM

## 2019-04-21 DIAGNOSIS — Z96653 Presence of artificial knee joint, bilateral: Secondary | ICD-10-CM

## 2019-04-21 DIAGNOSIS — L4050 Arthropathic psoriasis, unspecified: Secondary | ICD-10-CM

## 2019-04-21 DIAGNOSIS — R5382 Chronic fatigue, unspecified: Secondary | ICD-10-CM

## 2019-04-21 DIAGNOSIS — E785 Hyperlipidemia, unspecified: Secondary | ICD-10-CM

## 2019-04-21 DIAGNOSIS — Z8639 Personal history of other endocrine, nutritional and metabolic disease: Secondary | ICD-10-CM

## 2019-04-22 LAB — COMPLETE METABOLIC PANEL WITH GFR
AG Ratio: 1.2 (calc) (ref 1.0–2.5)
ALT: 5 U/L — ABNORMAL LOW (ref 6–29)
AST: 9 U/L — ABNORMAL LOW (ref 10–35)
Albumin: 3.4 g/dL — ABNORMAL LOW (ref 3.6–5.1)
Alkaline phosphatase (APISO): 74 U/L (ref 37–153)
BUN: 10 mg/dL (ref 7–25)
CO2: 32 mmol/L (ref 20–32)
Calcium: 8.6 mg/dL (ref 8.6–10.4)
Chloride: 102 mmol/L (ref 98–110)
Creat: 0.82 mg/dL (ref 0.50–0.99)
GFR, Est African American: 85 mL/min/{1.73_m2} (ref >=60)
GFR, Est Non African American: 74 mL/min/{1.73_m2} (ref >=60)
Globulin: 2.9 g/dL (calc) (ref 1.9–3.7)
Glucose, Bld: 131 mg/dL — ABNORMAL HIGH (ref 65–99)
Potassium: 4.2 mmol/L (ref 3.5–5.3)
Sodium: 142 mmol/L (ref 135–146)
Total Bilirubin: 0.5 mg/dL (ref 0.2–1.2)
Total Protein: 6.3 g/dL (ref 6.1–8.1)

## 2019-04-22 LAB — CBC WITH DIFFERENTIAL/PLATELET
Absolute Monocytes: 543 cells/uL (ref 200–950)
Basophils Absolute: 20 cells/uL (ref 0–200)
Basophils Relative: 0.3 %
Eosinophils Absolute: 181 cells/uL (ref 15–500)
Eosinophils Relative: 2.7 %
HCT: 35.3 % (ref 35.0–45.0)
Hemoglobin: 11.1 g/dL — ABNORMAL LOW (ref 11.7–15.5)
Lymphs Abs: 2372 cells/uL (ref 850–3900)
MCH: 27.2 pg (ref 27.0–33.0)
MCHC: 31.4 g/dL — ABNORMAL LOW (ref 32.0–36.0)
MCV: 86.5 fL (ref 80.0–100.0)
MPV: 10.5 fL (ref 7.5–12.5)
Monocytes Relative: 8.1 %
Neutro Abs: 3585 cells/uL (ref 1500–7800)
Neutrophils Relative %: 53.5 %
Platelets: 209 10*3/uL (ref 140–400)
RBC: 4.08 10*6/uL (ref 3.80–5.10)
RDW: 14.5 % (ref 11.0–15.0)
Total Lymphocyte: 35.4 %
WBC: 6.7 10*3/uL (ref 3.8–10.8)

## 2019-04-22 NOTE — Progress Notes (Signed)
Hgb is borderline low.  Hct WNL.  Please notify patient and forward labs to PCP.  LFTs are low.   Albumin is mildly low but improving. Rest of CMP WNL

## 2019-06-16 ENCOUNTER — Telehealth: Payer: Self-pay | Admitting: Rheumatology

## 2019-06-16 MED ORDER — COSENTYX SENSOREADY PEN 150 MG/ML ~~LOC~~ SOAJ: 300.0000 mg | SUBCUTANEOUS | 2 refills | Status: DC

## 2019-06-16 NOTE — Telephone Encounter (Signed)
Last Visit: 04/21/2019 Next Visit: 09/22/2019 Labs: 04/21/2019 Hgb is borderline low. Hct WNL. LFTs are low.  Albumin is mildly low but improving. Rest of CMP WNL TB Gold: 06/17/2018 negative   Okay to refill per Dr. Corliss Skains.   I attempted to contact patient to advise refill has been sent and no answer/voicemail available.

## 2019-06-16 NOTE — Telephone Encounter (Signed)
Patient left a voicemail requesting prescription refill of Cosentyx.  Patient states "they have a new fax number that is suppose to cut down on some of that red tape."  Fax #[phone]

## 2019-09-08 NOTE — Progress Notes (Deleted)
Office Visit Note  Patient: [name]             Date of Birth: [birth year]           MRN: [id number]             PCP: Jamal Collin, PA-C Referring: Jamal Collin, PA-C Visit Date: 09/22/2019 Occupation: @GUAROCC @  Subjective:  No chief complaint on file.   History of Present Illness: [name] is a 69 y.o. female ***   Activities of Daily Living:  Patient reports morning stiffness for *** {minute/hour:19697}.   Patient {ACTIONS;DENIES/REPORTS:21021675::"Denies"} nocturnal pain.  Difficulty dressing/grooming: {ACTIONS;DENIES/REPORTS:21021675::"Denies"} Difficulty climbing stairs: {ACTIONS;DENIES/REPORTS:21021675::"Denies"} Difficulty getting out of chair: {ACTIONS;DENIES/REPORTS:21021675::"Denies"} Difficulty using hands for taps, buttons, cutlery, and/or writing: {ACTIONS;DENIES/REPORTS:21021675::"Denies"}  No Rheumatology ROS completed.   PMFS History:  Patient Active Problem List   Diagnosis Date Noted  . Dyslipidemia 10/16/2016  . History of total knee replacement, bilateral 10/16/2016  . Primary osteoarthritis of both hands 10/16/2016  . History of diabetes mellitus 10/16/2016  . Other chronic pain 10/16/2016  . History of glaucoma 10/16/2016  . Psoriatic arthropathy (HCC) 07/06/2016  . Other psoriasis 07/06/2016  . High risk medications (not anticoagulants) long-term use 07/06/2016  . Fibromyalgia 07/06/2016  . Chronic fatigue 07/06/2016  . Primary insomnia 07/06/2016    Past Medical History:  Diagnosis Date  . Arthritis   . Cataract   . Diabetes mellitus without complication (HCC)   . Glaucoma   . Hypertension   . Vertigo     Family History  Problem Relation Age of Onset  . Cancer Sister   . Diabetes Sister   . COPD Brother   . Diabetes Brother    Past Surgical History:  Procedure Laterality Date  . ABDOMINAL HYSTERECTOMY    . APPENDECTOMY    . CARPAL TUNNEL RELEASE    . CHOLECYSTECTOMY    . FEMUR FRACTURE SURGERY Right   .  FIBULA FRACTURE SURGERY Left 09/01/2018  . REPLACEMENT TOTAL KNEE BILATERAL    . TIBIA FRACTURE SURGERY Left 09/01/2018  . TOTAL SHOULDER ARTHROPLASTY    . TRIGGER FINGER RELEASE    . TRIGGER FINGER RELEASE Right 02/2017   3rd and 4th digits.   Social History   Social History Narrative  . Not on file    There is no immunization history on file for this patient.   Objective: Vital Signs: There were no vitals taken for this visit.   Physical Exam   Musculoskeletal Exam: ***  CDAI Exam: CDAI Score: -- Patient Global: --; Provider Global: -- Swollen: --; Tender: -- Joint Exam 09/22/2019   No joint exam has been documented for this visit   There is currently no information documented on the homunculus. Go to the Rheumatology activity and complete the homunculus joint exam.  Investigation: No additional findings.  Imaging: No results found.  Recent Labs: Lab Results  Component Value Date   WBC 6.7 04/21/2019   HGB 11.1 (L) 04/21/2019   PLT 209 04/21/2019   NA 142 04/21/2019   K 4.2 04/21/2019   CL 102 04/21/2019   CO2 32 04/21/2019   GLUCOSE 131 (H) 04/21/2019   BUN 10 04/21/2019   CREATININE 0.82 04/21/2019   BILITOT 0.5 04/21/2019   ALKPHOS 70 07/18/2016   AST 9 (L) 04/21/2019   ALT 5 (L) 04/21/2019   PROT 6.3 04/21/2019   ALBUMIN 3.8 07/18/2016   CALCIUM 8.6 04/21/2019   GFRAA 85 04/21/2019   QFTBGOLDPLUS NEGATIVE 06/17/2018  Speciality Comments: No specialty comments available.  Procedures:  No procedures performed Allergies: Aspirin, Bee venom, Cephalosporins, Clarithromycin, Codeine, Demerol, E-mycin [erythromycin base], Etodolac, Gatifloxacin, Iodinated diagnostic agents, Levaquin  [levofloxacin in d5w], Levofloxacin, Levofloxacin, Lyrica [pregabalin], Meperidine, Morphine and related, Nsaids, Other, Phenothiazines, Shellfish-derived products, Tequin, Penicillins, and Sulfa drugs cross reactors   Assessment / Plan:     Visit Diagnoses: No  diagnosis found.  Orders: No orders of the defined types were placed in this encounter.  No orders of the defined types were placed in this encounter.   Face-to-face time spent with patient was *** minutes. Greater than 50% of time was spent in counseling and coordination of care.  Follow-Up Instructions: No follow-ups on file.   Ellen Henri, CMA  Note - This record has been created using Animal nutritionist.  Chart creation errors have been sought, but may not always  have been located. Such creation errors do not reflect on  the standard of medical care.

## 2019-09-22 ENCOUNTER — Ambulatory Visit: Payer: PRIVATE HEALTH INSURANCE | Admitting: Rheumatology

## 2019-09-28 ENCOUNTER — Telehealth: Payer: Self-pay | Admitting: Rheumatology

## 2019-09-28 NOTE — Telephone Encounter (Signed)
Left message to advise patient she will need a follow up appointment and labs prior to a refill.

## 2019-09-28 NOTE — Telephone Encounter (Signed)
Patient request refill on Cosentyx sent to [facility].

## 2019-09-30 ENCOUNTER — Telehealth: Payer: Self-pay | Admitting: *Deleted

## 2019-09-30 NOTE — Telephone Encounter (Signed)
Patient's sister contacted the office stating the patient is in rehab. Patient is unsure of when she may be released from rehab as she has another surgery that she will need to have. Patient is requesting a refill on her Cosentyx. Advised that we are unable to refill at this time as she is due for labs. Advised her to find out if we can fax the orders to the rehab facility to have them drawn there. Patient also advised to contact the office to let us know when she is scheduled for surgery so we can advise her about her medication.

## 2020-01-01 ENCOUNTER — Telehealth: Payer: Self-pay | Admitting: *Deleted

## 2020-01-01 NOTE — Telephone Encounter (Signed)
Patient states she has been off her Cosentyx since June. Patient states she had an I&D on her leg and then has been hospitalized off and on with an infection. Patient states she is seeing a Careers adviser and they are trying to encourage an amputation. Patient has been scheduled for a follow up visit on 01/21/2020.

## 2020-01-08 NOTE — Progress Notes (Deleted)
Office Visit Note  Patient: [name]             Date of Birth: Dec 20, 1950           MRN: [id number]             PCP: Jamal Collin, Caitlin-C Referring: Jamal Collin, Caitlin-C Visit Date: 01/21/2020 Occupation: @GUAROCC @  Subjective:  No chief complaint on file.   History of Present Illness: [name] is a 69 y.o. female ***   Activities of Daily Living:  Patient reports morning stiffness for *** {minute/hour:19697}.   Patient {ACTIONS;DENIES/REPORTS:21021675::"Denies"} nocturnal pain.  Difficulty dressing/grooming: {ACTIONS;DENIES/REPORTS:21021675::"Denies"} Difficulty climbing stairs: {ACTIONS;DENIES/REPORTS:21021675::"Denies"} Difficulty getting out of chair: {ACTIONS;DENIES/REPORTS:21021675::"Denies"} Difficulty using hands for taps, buttons, cutlery, and/or writing: {ACTIONS;DENIES/REPORTS:21021675::"Denies"}  No Rheumatology ROS completed.   PMFS History:  Patient Active Problem List   Diagnosis Date Noted  . Dyslipidemia 10/16/2016  . History of total knee replacement, bilateral 10/16/2016  . Primary osteoarthritis of both hands 10/16/2016  . History of diabetes mellitus 10/16/2016  . Other chronic pain 10/16/2016  . History of glaucoma 10/16/2016  . Psoriatic arthropathy (HCC) 07/06/2016  . Other psoriasis 07/06/2016  . High risk medications (not anticoagulants) long-term use 07/06/2016  . Fibromyalgia 07/06/2016  . Chronic fatigue 07/06/2016  . Primary insomnia 07/06/2016    Past Medical History:  Diagnosis Date  . Arthritis   . Cataract   . Diabetes mellitus without complication (HCC)   . Glaucoma   . Hypertension   . Vertigo     Family History  Problem Relation Age of Onset  . Cancer Sister   . Diabetes Sister   . COPD Brother   . Diabetes Brother    Past Surgical History:  Procedure Laterality Date  . ABDOMINAL HYSTERECTOMY    . APPENDECTOMY    . CARPAL TUNNEL RELEASE    . CHOLECYSTECTOMY    . FEMUR FRACTURE SURGERY Right   .  FIBULA FRACTURE SURGERY Left 09/01/2018  . REPLACEMENT TOTAL KNEE BILATERAL    . TIBIA FRACTURE SURGERY Left 09/01/2018  . TOTAL SHOULDER ARTHROPLASTY    . TRIGGER FINGER RELEASE    . TRIGGER FINGER RELEASE Right 02/2017   3rd and 4th digits.   Social History   Social History Narrative  . Not on file    There is no immunization history on file for this patient.   Objective: Vital Signs: There were no vitals taken for this visit.   Physical Exam   Musculoskeletal Exam: ***  CDAI Exam: CDAI Score: - Patient Global: -; Provider Global: - Swollen: -; Tender: - Joint Exam 01/21/2020   No joint exam has been documented for this visit   There is currently no information documented on the homunculus. Go to the Rheumatology activity and complete the homunculus joint exam.  Investigation: No additional findings.  Imaging: No results found.  Recent Labs: Lab Results  Component Value Date   WBC 6.7 04/21/2019   HGB 11.1 (L) 04/21/2019   PLT 209 04/21/2019   NA 142 04/21/2019   K 4.2 04/21/2019   CL 102 04/21/2019   CO2 32 04/21/2019   GLUCOSE 131 (H) 04/21/2019   BUN 10 04/21/2019   CREATININE 0.82 04/21/2019   BILITOT 0.5 04/21/2019   ALKPHOS 70 07/18/2016   AST 9 (L) 04/21/2019   ALT 5 (L) 04/21/2019   PROT 6.3 04/21/2019   ALBUMIN 3.8 07/18/2016   CALCIUM 8.6 04/21/2019   GFRAA 85 04/21/2019   QFTBGOLDPLUS NEGATIVE 06/17/2018  Speciality Comments: No specialty comments available.  Procedures:  No procedures performed Allergies: Aspirin, Bee venom, Cephalosporins, Clarithromycin, Codeine, Demerol, E-mycin [erythromycin base], Etodolac, Gatifloxacin, Iodinated diagnostic agents, Levaquin  [levofloxacin in d5w], Levofloxacin, Levofloxacin, Lyrica [pregabalin], Meperidine, Morphine and related, Nsaids, Other, Phenothiazines, Shellfish-derived products, Tequin, Penicillins, and Sulfa drugs cross reactors   Assessment / Plan:     Visit Diagnoses: Psoriatic  arthropathy (HCC)  Other psoriasis  High risk medication use  Primary osteoarthritis of both hands  History of total knee replacement, bilateral  Fibromyalgia  Chronic fatigue  Primary insomnia  History of diabetes mellitus  Age-related osteoporosis without current pathological fracture  History of fracture of tibia  History of fibula fracture  Dyslipidemia  Orders: No orders of the defined types were placed in this encounter.  No orders of the defined types were placed in this encounter.   Face-to-face time spent with patient was *** minutes. Greater than 50% of time was spent in counseling and coordination of care.  Follow-Up Instructions: No follow-ups on file.   Gearldine Bienenstock, Caitlin-C  Note - This record has been created using Dragon software.  Chart creation errors have been sought, but may not always  have been located. Such creation errors do not reflect on  the standard of medical care.

## 2020-01-21 ENCOUNTER — Ambulatory Visit: Payer: PRIVATE HEALTH INSURANCE | Admitting: Physician Assistant

## 2020-01-21 DIAGNOSIS — L4050 Arthropathic psoriasis, unspecified: Secondary | ICD-10-CM

## 2020-01-21 DIAGNOSIS — R5382 Chronic fatigue, unspecified: Secondary | ICD-10-CM

## 2020-01-21 DIAGNOSIS — Z79899 Other long term (current) drug therapy: Secondary | ICD-10-CM

## 2020-01-21 DIAGNOSIS — Z8639 Personal history of other endocrine, nutritional and metabolic disease: Secondary | ICD-10-CM

## 2020-01-21 DIAGNOSIS — Z8781 Personal history of (healed) traumatic fracture: Secondary | ICD-10-CM

## 2020-01-21 DIAGNOSIS — L408 Other psoriasis: Secondary | ICD-10-CM

## 2020-01-21 DIAGNOSIS — F5101 Primary insomnia: Secondary | ICD-10-CM

## 2020-01-21 DIAGNOSIS — M19041 Primary osteoarthritis, right hand: Secondary | ICD-10-CM

## 2020-01-21 DIAGNOSIS — E785 Hyperlipidemia, unspecified: Secondary | ICD-10-CM

## 2020-01-21 DIAGNOSIS — Z96653 Presence of artificial knee joint, bilateral: Secondary | ICD-10-CM

## 2020-01-21 DIAGNOSIS — M810 Age-related osteoporosis without current pathological fracture: Secondary | ICD-10-CM

## 2020-01-21 DIAGNOSIS — M797 Fibromyalgia: Secondary | ICD-10-CM

## 2020-03-25 HISTORY — PX: LEG AMPUTATION: SHX1105

## 2020-08-03 NOTE — Progress Notes (Signed)
Office Visit Note  Patient: [name]             Date of Birth: 1950-08-11           MRN: [id number]             PCP: Jamal Collin, PA-C Referring: Jamal Collin, PA-C Visit Date: 08/17/2020 Occupation: @GUAROCC @  Subjective:  Pain and swelling in multiple joints.   History of Present Illness: [name] is a 70 y.o. female history of psoriatic arthritis, osteoarthritis and osteoporosis.  She returns today after her last visit in March 2021.  She had left tibia and fibula fracture in 2020.  She was doing well at the time on Cosentyx 300 mg every 28 days.  She states that in December 2021 she started having increased pain and discomfort in her left lower extremity.  At the time the CT scan of the leg showed osteomyelitis.  She was treated with vancomycin but had severe reaction.  She was given the option of above-knee amputation or reconstructive surgery.  She states that her recovery was long after the reconstructive surgery.  She opted for above-knee amputation.  She has been off Cosentyx since she was diagnosed with infection.  Going to physical therapy and learning to walk.  She is mostly in a wheelchair currently.  She is having increased pain swelling and stiffness in her hands.  She also complains of discomfort in her right foot.  She gives history of psoriasis flare.  She has psoriasis on her scalp, elbows, lower extremity.  She wants to start back on Cosentyx.  She was on Evenity drug study for osteoporosis.  She finished the course of Evenity in April.  Activities of Daily Living:  Patient reports morning stiffness for all day. Patient Reports nocturnal pain.  Difficulty dressing/grooming: Denies Difficulty climbing stairs: Reports Difficulty getting out of chair: Reports Difficulty using hands for taps, buttons, cutlery, and/or writing: Reports  Review of Systems  Constitutional:  Negative for fatigue.  HENT:  Positive for mouth dryness and nose dryness.  Negative for mouth sores.   Eyes:  Positive for dryness. Negative for pain and itching.  Respiratory:  Negative for shortness of breath and difficulty breathing.   Cardiovascular:  Negative for chest pain and palpitations.  Gastrointestinal:  Positive for constipation. Negative for blood in stool and diarrhea.  Endocrine: Negative for increased urination.  Genitourinary:  Negative for difficulty urinating.  Musculoskeletal:  Positive for joint pain, joint pain, joint swelling, myalgias, morning stiffness, muscle tenderness and myalgias.  Skin:  Positive for rash. Negative for color change and sensitivity to sunlight.  Allergic/Immunologic: Negative for susceptible to infections.  Neurological:  Positive for memory loss. Negative for dizziness, numbness, headaches and weakness.  Hematological:  Negative for bruising/bleeding tendency.  Psychiatric/Behavioral:  Negative for confusion.    PMFS History:  Patient Active Problem List   Diagnosis Date Noted   Dyslipidemia 10/16/2016   History of total knee replacement, bilateral 10/16/2016   Primary osteoarthritis of both hands 10/16/2016   History of diabetes mellitus 10/16/2016   Other chronic pain 10/16/2016   History of glaucoma 10/16/2016   Psoriatic arthropathy (HCC) 07/06/2016   Other psoriasis 07/06/2016   High risk medications (not anticoagulants) long-term use 07/06/2016   Fibromyalgia 07/06/2016   Chronic fatigue 07/06/2016   Primary insomnia 07/06/2016    Past Medical History:  Diagnosis Date   Arthritis    Cataract    Diabetes mellitus without complication (HCC)  Glaucoma    Hypertension    Vertigo     Family History  Problem Relation Age of Onset   Cancer Sister    Diabetes Sister    COPD Brother    Diabetes Brother    Past Surgical History:  Procedure Laterality Date   ABDOMINAL HYSTERECTOMY     APPENDECTOMY     CARPAL TUNNEL RELEASE     CHOLECYSTECTOMY     FEMUR FRACTURE SURGERY Right    FIBULA FRACTURE  SURGERY Left 09/01/2018   LEG AMPUTATION Left 03/25/2020   REPLACEMENT TOTAL KNEE BILATERAL     TIBIA FRACTURE SURGERY Left 09/01/2018   TOTAL SHOULDER ARTHROPLASTY     TRIGGER FINGER RELEASE     TRIGGER FINGER RELEASE Right 02/2017   3rd and 4th digits.   Social History   Social History Narrative   Not on file    There is no immunization history on file for this patient.   Objective: Vital Signs: BP 136/75 (BP Location: Left Arm, Patient Position: Sitting, Cuff Size: Normal)   Pulse 74   Ht 5' 6.5" (1.689 m)   Wt 226 lb (102.5 kg) Comment: per patient, patient in wheelchair  BMI 35.93 kg/m    Physical Exam Vitals and nursing note reviewed.  Constitutional:      Appearance: She is well-developed.  HENT:     Head: Normocephalic and atraumatic.  Eyes:     Conjunctiva/sclera: Conjunctivae normal.  Cardiovascular:     Rate and Rhythm: Normal rate and regular rhythm.     Heart sounds: Normal heart sounds.  Pulmonary:     Effort: Pulmonary effort is normal.     Breath sounds: Normal breath sounds.  Abdominal:     General: Bowel sounds are normal.     Palpations: Abdomen is soft.  Musculoskeletal:     Cervical back: Normal range of motion.  Lymphadenopathy:     Cervical: No cervical adenopathy.  Skin:    General: Skin is warm and dry.     Capillary Refill: Capillary refill takes less than 2 seconds.     Comments: Psoriasis patches noted on her scalp, elbows and her right lower extremity.  Neurological:     Mental Status: She is alert and oriented to person, place, and time.  Psychiatric:        Behavior: Behavior normal.     Musculoskeletal Exam: C-spine was in good range of motion.  Shoulders and elbows were in good range of motion.  She good range of motion in her wrist joint.  Some synovial thickening was noted over the right second and third MCP joints.  No PIP and DIP swelling or synovitis was noted.  Hip joints were difficult to assess in the wheelchair.  She  had no warmth or swelling in her right knee.  No tenderness over ankles or MTPs was noted.  CDAI Exam: CDAI Score: -- Patient Global: --; Provider Global: -- Swollen: --; Tender: -- Joint Exam 08/17/2020   No joint exam has been documented for this visit   There is currently no information documented on the homunculus. Go to the Rheumatology activity and complete the homunculus joint exam.  Investigation: No additional findings.  Imaging: No results found.  Recent Labs: Lab Results  Component Value Date   WBC 6.7 04/21/2019   HGB 11.1 (L) 04/21/2019   PLT 209 04/21/2019   NA 142 04/21/2019   K 4.2 04/21/2019   CL 102 04/21/2019   CO2 32 04/21/2019  GLUCOSE 131 (H) 04/21/2019   BUN 10 04/21/2019   CREATININE 0.82 04/21/2019   BILITOT 0.5 04/21/2019   ALKPHOS 70 07/18/2016   AST 9 (L) 04/21/2019   ALT 5 (L) 04/21/2019   PROT 6.3 04/21/2019   ALBUMIN 3.8 07/18/2016   CALCIUM 8.6 04/21/2019   GFRAA 85 04/21/2019   QFTBGOLDPLUS NEGATIVE 06/17/2018    Speciality Comments: No specialty comments available.  Procedures:  No procedures performed Allergies: Aspirin, Bee venom, Cephalosporins, Clarithromycin, Codeine, Demerol, E-mycin [erythromycin base], Etodolac, Gatifloxacin, Iodinated diagnostic agents, Levaquin  [levofloxacin in d5w], Levofloxacin, Levofloxacin, Lyrica [pregabalin], Meperidine, Morphine and related, Nsaids, Other, Phenothiazines, Shellfish-derived products, Tequin, Penicillins, and Sulfa drugs cross reactors   Assessment / Plan:     Visit Diagnoses: Psoriatic arthropathy (HCC) -patient has known history of psoriatic arthritis.  She returns today after her last visit in March 2021.  She was treated with Cosentyx in the past and her symptoms were very well controlled.  She has not had to come off Cosentyx after she developed left tibia fracture and had surgery.  She developed osteomyelitis in her left lower extremity and had to undergo above-knee amputation.   She has been having increased pain and discomfort in her hands and her right foot.  She still experiences swelling in her right hand.  She wants to go back on Cosentyx.  I discussed different treatment options and their side effects.  Currently she does not have much synovitis.  She has psoriasis in her scalp and on her extremities.  Plan: Sedimentation rate.  Discussed the option of starting [place].  Indications side effects contraindications were discussed.  She was in agreement to proceed with [place].  We will apply for [facility].  Counseled patient that Henderson Baltimore is a PDE 4 inhibitor that works to treat psoriasis and the joint pain and tenderness of psoriatic arthritis.  Counseled patient on purpose, proper use, and adverse effects of Otezla.  Reviewed the most common adverse effects of weight loss, depression, nausea/diarrhea/vomiting, headaches, and nasal congestion.  Advised patient to notify office of any serious changes in mood and/or thoughts of suicide.  Provided patient with medication education material and answered all questions.  Patient consented to [place].    Patient dose will be Otezla starter titration pack and then 30 mg twice daily.  Prescription pending insurance approval and once approved patient may pick up sample for starter pack from our office.   Other psoriasis-she had psoriasis on her scalp and on her extremities.  High risk medication use -in the past she was treated with Cosentyx 300 mg sq injections every 28 days.   - Plan: CBC with Differential/Platelet, COMPLETE METABOLIC PANEL WITH GFR today  Pain in both hands -she complains of pain and discomfort in her bilateral hands.  She had some synovitis of her right second and third MCP joints.  Plan: XR Hand 2 View Right, XR Hand 2 View Left.  X-rays were consistent with inflammatory arthritis and osteoarthritis overlap.  Primary osteoarthritis of both hands-she has bilateral PIP and DIP thickening.  History of total knee  replacement, bilateral  Pain in right foot -she complains of discomfort in her right foot.  No tenderness was noted on the examination.  Plan: XR Foot 2 Views Right.  X-rays were consistent with osteoarthritis and inflammatory arthritis overlap.  Age-related osteoporosis without current pathological fracture - According to the patient she was diagnosed with osteoporosis and was started on Evenity by her PCP.    Fibromyalgia-she still has generalized  pain.  Chronic fatigue-related to fibromyalgia and insomnia.  Primary insomnia-good sleep hygiene was discussed.  History of diabetes mellitus  History of fracture of tibia - She had surgical fixation performed by Dr. Earna Coder.  She was discharged from rehab center on 11/10/2018.   History of fibula fracture - She fractured the tibia and fibula in July 2020 after a fall.    History of osteomyelitis-she developed osteomyelitis in her left tibia after the fracture.  She had to undergo above-knee amputation.  Dyslipidemia  Orders: Orders Placed This Encounter  Procedures   XR Hand 2 View Right   XR Hand 2 View Left   XR Foot 2 Views Right   CBC with Differential/Platelet   COMPLETE METABOLIC PANEL WITH GFR   Sedimentation rate   No orders of the defined types were placed in this encounter.    Follow-Up Instructions: Return in about 6 weeks (around 09/28/2020) for Psoriatic arthritis.   Pollyann Savoy, MD  Note - This record has been created using Animal nutritionist.  Chart creation errors have been sought, but may not always  have been located. Such creation errors do not reflect on  the standard of medical care.

## 2020-08-17 ENCOUNTER — Other Ambulatory Visit: Payer: Self-pay

## 2020-08-17 ENCOUNTER — Ambulatory Visit: Payer: Self-pay

## 2020-08-17 ENCOUNTER — Telehealth: Payer: Self-pay

## 2020-08-17 ENCOUNTER — Encounter: Payer: Self-pay | Admitting: Rheumatology

## 2020-08-17 ENCOUNTER — Ambulatory Visit (INDEPENDENT_AMBULATORY_CARE_PROVIDER_SITE_OTHER): Payer: Medicare Other | Admitting: Rheumatology

## 2020-08-17 VITALS — BP 136/75 | HR 74 | Ht 66.5 in | Wt 226.0 lb

## 2020-08-17 DIAGNOSIS — L4050 Arthropathic psoriasis, unspecified: Secondary | ICD-10-CM

## 2020-08-17 DIAGNOSIS — F5101 Primary insomnia: Secondary | ICD-10-CM

## 2020-08-17 DIAGNOSIS — M79671 Pain in right foot: Secondary | ICD-10-CM

## 2020-08-17 DIAGNOSIS — Z96653 Presence of artificial knee joint, bilateral: Secondary | ICD-10-CM

## 2020-08-17 DIAGNOSIS — Z79899 Other long term (current) drug therapy: Secondary | ICD-10-CM

## 2020-08-17 DIAGNOSIS — L408 Other psoriasis: Secondary | ICD-10-CM

## 2020-08-17 DIAGNOSIS — M19042 Primary osteoarthritis, left hand: Secondary | ICD-10-CM

## 2020-08-17 DIAGNOSIS — M797 Fibromyalgia: Secondary | ICD-10-CM

## 2020-08-17 DIAGNOSIS — M79641 Pain in right hand: Secondary | ICD-10-CM

## 2020-08-17 DIAGNOSIS — Z8739 Personal history of other diseases of the musculoskeletal system and connective tissue: Secondary | ICD-10-CM

## 2020-08-17 DIAGNOSIS — M79642 Pain in left hand: Secondary | ICD-10-CM

## 2020-08-17 DIAGNOSIS — M19041 Primary osteoarthritis, right hand: Secondary | ICD-10-CM

## 2020-08-17 DIAGNOSIS — E785 Hyperlipidemia, unspecified: Secondary | ICD-10-CM

## 2020-08-17 DIAGNOSIS — Z8639 Personal history of other endocrine, nutritional and metabolic disease: Secondary | ICD-10-CM

## 2020-08-17 DIAGNOSIS — R5382 Chronic fatigue, unspecified: Secondary | ICD-10-CM

## 2020-08-17 DIAGNOSIS — Z8781 Personal history of (healed) traumatic fracture: Secondary | ICD-10-CM

## 2020-08-17 DIAGNOSIS — M810 Age-related osteoporosis without current pathological fracture: Secondary | ICD-10-CM

## 2020-08-17 LAB — COMPLETE METABOLIC PANEL WITH GFR
AG Ratio: 1.3 (calc) (ref 1.0–2.5)
ALT: 53 U/L — ABNORMAL HIGH (ref 6–29)
AST: 51 U/L — ABNORMAL HIGH (ref 10–35)
Albumin: 3.6 g/dL (ref 3.6–5.1)
Alkaline phosphatase (APISO): 131 U/L (ref 37–153)
BUN: 15 mg/dL (ref 7–25)
CO2: 29 mmol/L (ref 20–32)
Calcium: 9 mg/dL (ref 8.6–10.4)
Chloride: 99 mmol/L (ref 98–110)
Creat: 0.93 mg/dL (ref 0.50–0.99)
GFR, Est African American: 73 mL/min/{1.73_m2} (ref >=60)
GFR, Est Non African American: 63 mL/min/{1.73_m2} (ref >=60)
Globulin: 2.7 g/dL (calc) (ref 1.9–3.7)
Glucose, Bld: 382 mg/dL — ABNORMAL HIGH (ref 65–99)
Potassium: 4.6 mmol/L (ref 3.5–5.3)
Sodium: 136 mmol/L (ref 135–146)
Total Bilirubin: 0.4 mg/dL (ref 0.2–1.2)
Total Protein: 6.3 g/dL (ref 6.1–8.1)

## 2020-08-17 LAB — CBC WITH DIFFERENTIAL/PLATELET
Absolute Monocytes: 476 cells/uL (ref 200–950)
Basophils Absolute: 33 cells/uL (ref 0–200)
Basophils Relative: 0.4 %
Eosinophils Absolute: 139 cells/uL (ref 15–500)
Eosinophils Relative: 1.7 %
HCT: 41.8 % (ref 35.0–45.0)
Hemoglobin: 12.9 g/dL (ref 11.7–15.5)
Lymphs Abs: 2673 cells/uL (ref 850–3900)
MCH: 26.9 pg — ABNORMAL LOW (ref 27.0–33.0)
MCHC: 30.9 g/dL — ABNORMAL LOW (ref 32.0–36.0)
MCV: 87.1 fL (ref 80.0–100.0)
MPV: 11.1 fL (ref 7.5–12.5)
Monocytes Relative: 5.8 %
Neutro Abs: 4879 cells/uL (ref 1500–7800)
Neutrophils Relative %: 59.5 %
Platelets: 231 10*3/uL (ref 140–400)
RBC: 4.8 10*6/uL (ref 3.80–5.10)
RDW: 12.6 % (ref 11.0–15.0)
Total Lymphocyte: 32.6 %
WBC: 8.2 10*3/uL (ref 3.8–10.8)

## 2020-08-17 LAB — SEDIMENTATION RATE: Sed Rate: 51 mm/h — ABNORMAL HIGH (ref 0–30)

## 2020-08-17 NOTE — Telephone Encounter (Signed)
Please apply for [facility], per Dr. Corliss Skains.   Consent obtained and sent to the scan center. Also provided patient with [facility] patient assistance application to complete.  Provider portion has been placed on Devki's desk.  Thanks!

## 2020-08-17 NOTE — Patient Instructions (Addendum)
Apremilast oral tablets What is this medication? APREMILAST (a PRE mil ast) is used to treat plaque psoriasis, psoriaticarthritis, and certain oral ulcers. This medicine may be used for other purposes; ask your health care provider orpharmacist if you have questions. COMMON BRAND NAME(S): Henderson Baltimore What should I tell my care team before I take this medication? They need to know if you have any of these conditions: dehydration kidney disease mental illness an unusual or allergic reaction to apremilast, other medicines, foods, dyes, or preservatives pregnant or trying to get pregnant breast-feeding How should I use this medication? Take this medicine by mouth with a glass of water. Follow the directions on the prescription label. Do not cut, crush or chew this medicine. You can take it with or without food. If it upsets your stomach, take it with food. Take your medicine at regular intervals. Do not take it more often than directed. Do notstop taking except on your doctor's advice. Talk to your pediatrician regarding the use of this medicine in children.Special care may be needed. Overdosage: If you think you have taken too much of this medicine contact apoison control center or emergency room at once. NOTE: This medicine is only for you. Do not share this medicine with others. What if I miss a dose? If you miss a dose, take it as soon as you can. If it is almost time for yournext dose, take only that dose. Do not take double or extra doses. What may interact with this medication? This medicine may interact with the following medications: certain medicines for seizures like carbamazepine, phenobarbital, phenytoin rifampin This list may not describe all possible interactions. Give your health care provider a list of all the medicines, herbs, non-prescription drugs, or dietary supplements you use. Also tell them if you smoke, drink alcohol, or use illegaldrugs. Some items may interact with your  medicine. What should I watch for while using this medication? Tell your doctor or healthcare professional if your symptoms do not start toget better or if they get worse. Patients and their families should watch out for new or worsening depression or thoughts of suicide. Also watch out for sudden changes in feelings such as feeling anxious, agitated, panicky, irritable, hostile, aggressive, impulsive, severely restless, overly excited and hyperactive, or not being able to sleep.If this happens, call your health care professional. Check with your doctor or health care professional if you get an attack of severe diarrhea, nausea and vomiting, or if you sweat a lot. The loss of toomuch body fluid can make it dangerous for you to take this medicine. What side effects may I notice from receiving this medication? Side effects that you should report to your care team as soon as possible: Allergic reactions or angioedema-skin rash, itching, hives, swelling of the face, eyes, lips, tongue, arms, or legs, trouble swallowing or breathing Depressed mood Weight loss Side effects that usually do not require medical attention (report to your careteam if they continue or are bothersome): Diarrhea Headache Nausea Vomiting This list may not describe all possible side effects. Call your doctor for medical advice about side effects. You may report side effects to FDA at1-800-FDA-1088. Where should I keep my medication? Keep out of the reach of children. Store below 30 degrees C (86 degrees F). Throw away any unused medicine afterthe expiration date. NOTE: This sheet is a summary. It may not cover all possible information. If you have questions about this medicine, talk to your doctor, pharmacist, orhealth care provider.  2022 Elsevier/Gold  Standard (2020-02-10 09:56:00)

## 2020-08-18 ENCOUNTER — Other Ambulatory Visit (HOSPITAL_COMMUNITY): Payer: Self-pay

## 2020-08-18 NOTE — Telephone Encounter (Signed)
Submitted a Prior Authorization request to [facility] for OTEZLA via CoverMyMeds. Will update once we receive a response.   Key: [id number]

## 2020-08-18 NOTE — Telephone Encounter (Signed)
Received notification from [facility] regarding a prior authorization for OTEZLA. Authorization has been APPROVED from 02/20/2020 to 02/18/2021.   Patient can fill through [facility]: [phone]   Authorization # [id number] Phone # [phone]   Test claim revealed that patient has a $0.00 copay for both the starter pack and maintenance doses.

## 2020-08-18 NOTE — Telephone Encounter (Signed)
Dose: Otezla 30mg  twice daily (she will receive starter pack in clinic once she is fully approved through insurance/PAP)  Dx: PsA( L40.50), psoriasis (L40.8)  She has received Cosentyx through patient assistance so will likely have to pursue Otezla PAP through [employer] once prior auth is approved  , PharmD, MPH Clinical Pharmacist (Rheumatology and Pulmonology)

## 2020-08-18 NOTE — Progress Notes (Signed)
CBC is normal.  Glucose is elevated.  Liver functions are elevated most likely due to statin use.  Sedimentation rate is elevated.  Plan is to start patient on Otezla when approved.  Please forward results to her PCP.  Please notify patient of her elevated glucose and liver functions.

## 2020-08-19 ENCOUNTER — Other Ambulatory Visit (HOSPITAL_COMMUNITY): Payer: Self-pay

## 2020-08-19 MED ORDER — APREMILAST 30 MG PO TABS
1.0000 | ORAL_TABLET | Freq: Two times a day (BID) | ORAL | 0 refills | Status: DC
  Filled 2020-08-19: qty 120, 30d supply, fill #0

## 2020-08-19 MED ORDER — APREMILAST 10 & 20 & 30 MG PO TBPK
ORAL_TABLET | ORAL | 0 refills | Status: DC
  Filled 2020-08-19 – 2020-08-23 (×2): qty 55, 30d supply, fill #0
  Filled 2020-08-24: qty 55, 28d supply, fill #0
  Filled 2020-08-25: qty 55, 30d supply, fill #0

## 2020-08-19 NOTE — Telephone Encounter (Signed)
Rx scheduled in [facility] to be filled on 7/5 and delivered to patient's home address on 7/6. Currently awaiting clinical review to be completed and onboarding tasks to be added to [place].

## 2020-08-19 NOTE — Telephone Encounter (Addendum)
Rx for Otezla starter pack and maintenance pack sent to [facility].  Patient confirmed that she will be home next week and confirmed address on file is correct for shipment.  Will route to [facility] to initiate fill and set up first delivery for next week  Chesley Mires, PharmD, MPH Clinical Pharmacist (Rheumatology and Pulmonology)

## 2020-08-19 NOTE — Addendum Note (Signed)
Addended by: Murrell Redden on: 08/19/2020 09:56 AM   Modules accepted: Orders

## 2020-08-23 ENCOUNTER — Other Ambulatory Visit (HOSPITAL_COMMUNITY): Payer: Self-pay

## 2020-08-23 NOTE — Telephone Encounter (Signed)
Onboarding tasks completed in [place]. Nothing else needed at this time.

## 2020-08-24 ENCOUNTER — Other Ambulatory Visit (HOSPITAL_COMMUNITY): Payer: Self-pay

## 2020-08-25 ENCOUNTER — Other Ambulatory Visit (HOSPITAL_COMMUNITY): Payer: Self-pay

## 2020-08-25 NOTE — Telephone Encounter (Signed)
Fill reinitiated with instructions to deliver to patients home address. Will most likely need to be ordered by pharmacy.

## 2020-08-29 ENCOUNTER — Other Ambulatory Visit (HOSPITAL_COMMUNITY): Payer: Self-pay

## 2020-08-29 NOTE — Telephone Encounter (Signed)
Order is in shipped status as of 7/11. Nothing further needed.

## 2020-09-01 ENCOUNTER — Other Ambulatory Visit (HOSPITAL_COMMUNITY): Payer: Self-pay

## 2020-09-01 ENCOUNTER — Telehealth: Payer: Self-pay | Admitting: Rheumatology

## 2020-09-01 NOTE — Telephone Encounter (Signed)
[name] - Please call patient on 09/02/2020 to confirm patient understands Dr. Fatima Sanger message, [facility] for patient to d/c [name]

## 2020-09-01 NOTE — Telephone Encounter (Signed)
Patient states she took first dose of Otezla yesterday 08/31/2020 around 12:00 pm. Patient states while eating dinner, she was having trouble swallowing. Patient states she threw up "ropy mucous as well as the food she was trying to swallow". Patient denies any swelling or throat closure or shortness of breath. Patient is concerned regarding this reaction after taking the [place]. Patient has not taken another dose. Patient states she did take Benadryl and that helped. Please advise.

## 2020-09-01 NOTE — Telephone Encounter (Signed)
Please advise patient to discontinue [place].  We will need clearance from her orthopedic surgeon before we can patrolman any immunosuppressive agents.

## 2020-09-01 NOTE — Telephone Encounter (Signed)
Patient calling in reference to a reaction she is having with her [name] that she started today. Patient was not able to swallow after taking dose, and throwing up mucus. Please call patient to advise.

## 2020-09-05 NOTE — Telephone Encounter (Signed)
Patient advised we will need clearance from her orthopedic surgeon before we can prescribe any immunosuppressive agents. Patient expressed understanding. patient discontinued [place]

## 2020-09-06 ENCOUNTER — Telehealth: Payer: Self-pay | Admitting: Rheumatology

## 2020-09-06 NOTE — Telephone Encounter (Signed)
Contacted Dr. Tawana Scale office and advised we will need a clearance letter faxed to our office. Message sent to his assistance.

## 2020-09-06 NOTE — Telephone Encounter (Signed)
Calling with [name] so patient can start back on her medication. Surgery was 03/25/2020. Patient is all clear to start back on arthritis medication.

## 2020-09-07 NOTE — Telephone Encounter (Signed)
Patient advised we received clearance letter. Patient has been scheduled for a sooner appointment on 09/14/2020 at 10:20 am.

## 2020-09-07 NOTE — Telephone Encounter (Signed)
Attempted to contact the patient and left message for patient to call the office.  

## 2020-09-09 NOTE — Progress Notes (Deleted)
Office Visit Note  Patient: [name]             Date of Birth: Aug 01, 1950           MRN: [id number]             PCP: Jamal Collin, PA-C Referring: Jamal Collin, PA-C Visit Date: 09/15/2020 Occupation: @GUAROCC @  Subjective:  Discuss medication options   History of Present Illness: [name] is a 70 y.o. female with history of psoriatic arthritis, osteoarthritis, and osteoporosis.   Clearance letter from orthopedist received on 09/07/20.   Tremfya? Skyrizzi?  Activities of Daily Living:  Patient reports morning stiffness for *** {minute/hour:19697}.   Patient {ACTIONS;DENIES/REPORTS:21021675::"Denies"} nocturnal pain.  Difficulty dressing/grooming: {ACTIONS;DENIES/REPORTS:21021675::"Denies"} Difficulty climbing stairs: {ACTIONS;DENIES/REPORTS:21021675::"Denies"} Difficulty getting out of chair: {ACTIONS;DENIES/REPORTS:21021675::"Denies"} Difficulty using hands for taps, buttons, cutlery, and/or writing: {ACTIONS;DENIES/REPORTS:21021675::"Denies"}  No Rheumatology ROS completed.   PMFS History:  Patient Active Problem List   Diagnosis Date Noted   Dyslipidemia 10/16/2016   History of total knee replacement, bilateral 10/16/2016   Primary osteoarthritis of both hands 10/16/2016   History of diabetes mellitus 10/16/2016   Other chronic pain 10/16/2016   History of glaucoma 10/16/2016   Psoriatic arthropathy (HCC) 07/06/2016   Other psoriasis 07/06/2016   High risk medications (not anticoagulants) long-term use 07/06/2016   Fibromyalgia 07/06/2016   Chronic fatigue 07/06/2016   Primary insomnia 07/06/2016    Past Medical History:  Diagnosis Date   Arthritis    Cataract    Diabetes mellitus without complication (HCC)    Glaucoma    Hypertension    Vertigo     Family History  Problem Relation Age of Onset   Cancer Sister    Diabetes Sister    COPD Brother    Diabetes Brother    Past Surgical History:  Procedure Laterality Date    ABDOMINAL HYSTERECTOMY     APPENDECTOMY     CARPAL TUNNEL RELEASE     CHOLECYSTECTOMY     FEMUR FRACTURE SURGERY Right    FIBULA FRACTURE SURGERY Left 09/01/2018   LEG AMPUTATION Left 03/25/2020   REPLACEMENT TOTAL KNEE BILATERAL     TIBIA FRACTURE SURGERY Left 09/01/2018   TOTAL SHOULDER ARTHROPLASTY     TRIGGER FINGER RELEASE     TRIGGER FINGER RELEASE Right 02/2017   3rd and 4th digits.   Social History   Social History Narrative   Not on file    There is no immunization history on file for this patient.   Objective: Vital Signs: There were no vitals taken for this visit.   Physical Exam Vitals and nursing note reviewed.  Constitutional:      Appearance: She is well-developed.  HENT:     Head: Normocephalic and atraumatic.  Eyes:     Conjunctiva/sclera: Conjunctivae normal.  Pulmonary:     Effort: Pulmonary effort is normal.  Abdominal:     Palpations: Abdomen is soft.  Musculoskeletal:     Cervical back: Normal range of motion.  Skin:    General: Skin is warm and dry.     Capillary Refill: Capillary refill takes less than 2 seconds.  Neurological:     Mental Status: She is alert and oriented to person, place, and time.  Psychiatric:        Behavior: Behavior normal.     Musculoskeletal Exam: ***  CDAI Exam: CDAI Score: -- Patient Global: --; Provider Global: -- Swollen: --; Tender: -- Joint Exam 09/15/2020   No joint exam  has been documented for this visit   There is currently no information documented on the homunculus. Go to the Rheumatology activity and complete the homunculus joint exam.  Investigation: No additional findings.  Imaging: XR Foot 2 Views Right  Result Date: 08/17/2020 Juxta-articular osteopenia was noted.  First MTP, PIP and DIP narrowing was noted.  No erosive changes were noted.  No intertarsal narrowing was noted.  No tibiotalar or subtalar joint space narrowing was noted.  Inferior and posterior calcaneal spurs were noted.   Cystic changes were noted in some of the MTPs. 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  XR Hand 2 View Left  Result Date: 08/17/2020 Juxta-articular osteopenia was noted.  No MCP narrowing was noted.  PIP and DIP narrowing was noted.  Severe CMC narrowing and subluxation was noted.  No erosive changes were noted.  No intercarpal or radiocarpal joint space narrowing was noted. 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  XR Hand 2 View Right  Result Date: 08/17/2020 Juxta-articular osteopenia was noted.  Second and third MCP joint narrowing was noted.  PIP and DIP narrowing was noted.  CMC narrowing and subluxation was noted.  No intercarpal or radiocarpal joint space narrowing was noted.  Cystic changes were noted in the carpal bones. 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   Recent Labs: Lab Results  Component Value Date   WBC 8.2 08/17/2020   HGB 12.9 08/17/2020   PLT 231 08/17/2020   NA 136 08/17/2020   K 4.6 08/17/2020   CL 99 08/17/2020   CO2 29 08/17/2020   GLUCOSE 382 (H) 08/17/2020   BUN 15 08/17/2020   CREATININE 0.93 08/17/2020   BILITOT 0.4 08/17/2020   ALKPHOS 70 07/18/2016   AST 51 (H) 08/17/2020   ALT 53 (H) 08/17/2020   PROT 6.3 08/17/2020   ALBUMIN 3.8 07/18/2016   CALCIUM 9.0 08/17/2020   GFRAA 73 08/17/2020   QFTBGOLDPLUS NEGATIVE 06/17/2018    Speciality Comments: [place] started 08/24/20  Procedures:  No procedures performed Allergies: Apremilast, Aspirin, Bee venom, Cephalosporins, Clarithromycin, Codeine, Demerol, E-mycin [erythromycin base], Etodolac, Gatifloxacin, Iodinated diagnostic agents, Levaquin  [levofloxacin in d5w], Levofloxacin, Levofloxacin, Lyrica [pregabalin], Meperidine, Morphine and related, Nsaids, Other, Phenothiazines, Shellfish-derived products, Tequin,  Penicillins, and Sulfa drugs cross reactors   Assessment / Plan:     Visit Diagnoses: No diagnosis found.  Orders: No orders of the defined types were placed in this encounter.  No orders of the defined types were placed in this encounter.   Face-to-face time spent with patient was *** minutes. Greater than 50% of time was spent in counseling and coordination of care.  Follow-Up Instructions: No follow-ups on file.   Ellen Henri, CMA  Note - This record has been created using Animal nutritionist.  Chart creation errors have been sought, but may not always  have been located. Such creation errors do not reflect on  the standard of medical care.

## 2020-09-13 ENCOUNTER — Telehealth: Payer: Self-pay | Admitting: Rheumatology

## 2020-09-13 NOTE — Telephone Encounter (Signed)
I called Dr. Josefine Class (orthopedic surgeon at [facility]).  I discussed with Dr. Andrey Campanile that we will be starting [name] on immunosuppressive biologic agent and I needed clearance from him.  Dr. 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  He stated the prosthesis was completely removed and the area around the prosthesis was removed as well during the amputation.  He said there is no increased risk of infection due to previous history of prosthesis infection and CaitlinRivere may resume immunosuppressive agents as needed. Pollyann Savoy, MD

## 2020-09-14 ENCOUNTER — Ambulatory Visit: Payer: PRIVATE HEALTH INSURANCE | Admitting: Physician Assistant

## 2020-09-14 NOTE — Telephone Encounter (Signed)
Noted. Patient is no longer taking Otezla due to throat swelling. Allergy was added to her allergy ist on 09/06/20. Discontinued Otezla rx today from patient's medication list. Mickie Bail has been notified to hold on reaching out to patient.   Patient r/s f/u visit to 09/20/20  Chesley Mires, PharmD, MPH, BCPS Clinical Pharmacist (Rheumatology and Pulmonology)

## 2020-09-14 NOTE — Addendum Note (Signed)
Addended by: Murrell Redden on: 09/14/2020 03:04 PM   Modules accepted: Orders

## 2020-09-14 NOTE — Progress Notes (Signed)
Office Visit Note  Patient: [name]             Date of Birth: 20-Jun-1950           MRN: [id number]             PCP: Jamal Collin, PA-C Referring: Rubye Beach Visit Date: 09/20/2020 Occupation: @GUAROCC @  Subjective:  Discuss treatment options   History of Present Illness: [name] is a 70 y.o. female with history of psoriatic arthritis, osteoarthritis, and osteoporosis.  Patient presents today to discuss treatment options.  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  She could not tolerate taking Otezla due to some difficulty swallowing.  She previously was on Cosentyx and had great psoriasis clearance and her joint pain and inflammation were well controlled. She is currently having pain and stiffness in both hands.  She has noticed intermittent swelling in both hands.  She has occasional SI joint pain bilaterally.  She has psoriasis on her scalp and right elbow which have been itchy.  She has been using an OTC topical agent as needed.  She has been working with a 78 every 2-3 weeks and remains on cymbalta as prescribed.     Activities of Daily Living:  Patient reports morning stiffness for 6 hours.   Patient Denies nocturnal pain.  Difficulty dressing/grooming: Reports Difficulty climbing stairs: Reports Difficulty getting out of chair: Reports Difficulty using hands for taps, buttons, cutlery, and/or writing: Reports  Review of Systems  Constitutional:  Positive for fatigue.  HENT:  Positive for mouth dryness. Negative for mouth sores and nose dryness.   Eyes:  Positive for dryness. Negative for pain and visual disturbance.  Respiratory:  Negative for cough, hemoptysis, shortness of breath and difficulty breathing.   Cardiovascular:  Positive for swelling in legs/feet. Negative for chest pain, palpitations and hypertension.   Gastrointestinal:  Positive for constipation. Negative for blood in stool and diarrhea.  Endocrine: Negative for increased urination.  Genitourinary:  Negative for difficulty urinating and painful urination.  Musculoskeletal:  Positive for joint pain, gait problem, joint pain, joint swelling, muscle weakness, morning stiffness and muscle tenderness. Negative for myalgias and myalgias.  Skin:  Positive for rash. Negative for color change, pallor, hair loss, nodules/bumps, skin tightness, ulcers and sensitivity to sunlight.  Allergic/Immunologic: Positive for susceptible to infections.  Neurological:  Negative for dizziness, numbness, headaches and weakness.  Hematological:  Negative for bruising/bleeding tendency and swollen glands.  Psychiatric/Behavioral:  Negative for depressed mood and sleep disturbance. The patient is not nervous/anxious.    PMFS History:  Patient Active Problem List   Diagnosis Date Noted   Dyslipidemia 10/16/2016   History of total knee replacement, bilateral 10/16/2016   Primary osteoarthritis of both hands 10/16/2016   History of diabetes mellitus 10/16/2016   Other chronic pain 10/16/2016   History of glaucoma 10/16/2016   Psoriatic arthropathy (HCC) 07/06/2016   Other psoriasis 07/06/2016   High risk medications (not anticoagulants) long-term use 07/06/2016   Fibromyalgia 07/06/2016   Chronic fatigue 07/06/2016   Primary insomnia 07/06/2016    Past Medical History:  Diagnosis Date   Arthritis    Cataract    Diabetes mellitus without complication (HCC)    Glaucoma    Hypertension    Vertigo     Family History  Problem Relation Age of Onset   Cancer Sister    Diabetes Sister  COPD Brother    Diabetes Brother    Past Surgical History:  Procedure Laterality Date   ABDOMINAL HYSTERECTOMY     APPENDECTOMY     CARPAL TUNNEL RELEASE     CHOLECYSTECTOMY     FEMUR FRACTURE SURGERY Right    FIBULA FRACTURE SURGERY Left 09/01/2018   LEG AMPUTATION  Left 03/25/2020   REPLACEMENT TOTAL KNEE BILATERAL     TIBIA FRACTURE SURGERY Left 09/01/2018   TOTAL SHOULDER ARTHROPLASTY     TRIGGER FINGER RELEASE     TRIGGER FINGER RELEASE Right 02/2017   3rd and 4th digits.   Social History   Social History Narrative   Not on file   Immunization History  Administered Date(s) Administered   [employer]) SARS-COV-2 Vaccination 02/22/2020     Objective: Vital Signs: BP 121/68 (BP Location: Left Arm, Patient Position: Sitting, Cuff Size: Large)   Pulse 66   Resp 13   Ht 5' 6.5" (1.689 m)   BMI 35.93 kg/m    Physical Exam Vitals and nursing note reviewed.  Constitutional:      Appearance: She is well-developed.  HENT:     Head: Normocephalic and atraumatic.  Eyes:     Conjunctiva/sclera: Conjunctivae normal.  Pulmonary:     Effort: Pulmonary effort is normal.  Abdominal:     Palpations: Abdomen is soft.  Musculoskeletal:     Cervical back: Normal range of motion.  Skin:    General: Skin is warm and dry.     Capillary Refill: Capillary refill takes less than 2 seconds.     Comments: Psoriasis on scalp and extensor surface of right elbow.   Neurological:     Mental Status: She is alert and oriented to person, place, and time.  Psychiatric:        Behavior: Behavior normal.     Musculoskeletal Exam: C-spine has good range of motion with no discomfort.  Shoulder joints have good range of motion with some discomfort bilaterally.  Elbow joints have good range of motion with no tenderness or inflammation.  Wrist joints have good range of motion with no tenderness or inflammation.  She has tenderness over the second through fifth MCP joints of the right hand.  Synovitis over the right third MCP joint noted.  PIP and DIP thickening consistent with osteoarthritis of both hands noted.  Hip joints difficult to assess well in wheelchair.  Right knee replacement has good range of motion with no warmth or effusion.  Right ankle joint has good  range of motion with no tenderness or joint swelling. 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   CDAI Exam: CDAI Score: -- Patient Global: --; Provider Global: -- Swollen: --; Tender: -- Joint Exam 09/20/2020   No joint exam has been documented for this visit   There is currently no information documented on the homunculus. Go to the Rheumatology activity and complete the homunculus joint exam.  Investigation: No additional findings.  Imaging: No results found.  Recent Labs: Lab Results  Component Value Date   WBC 8.2 08/17/2020   HGB 12.9 08/17/2020   PLT 231 08/17/2020   NA 136 08/17/2020   K 4.6 08/17/2020   CL 99 08/17/2020   CO2 29 08/17/2020   GLUCOSE 382 (H) 08/17/2020   BUN 15 08/17/2020   CREATININE 0.93 08/17/2020   BILITOT 0.4 08/17/2020   ALKPHOS 70 07/18/2016   AST 51 (H) 08/17/2020   ALT 53 (H) 08/17/2020   PROT 6.3 08/17/2020  ALBUMIN 3.8 07/18/2016   CALCIUM 9.0 08/17/2020   GFRAA 73 08/17/2020   QFTBGOLDPLUS NEGATIVE 06/17/2018    Speciality Comments: [place] started 08/24/20  Procedures:  No procedures performed Allergies: Apremilast, Aspirin, Bee venom, Cephalosporins, Clarithromycin, Codeine, Demerol, E-mycin [erythromycin base], Etodolac, Gatifloxacin, Iodinated diagnostic agents, Levaquin  [levofloxacin in d5w], Levofloxacin, Levofloxacin, Lyrica [pregabalin], Meperidine, Morphine and related, Nsaids, Other, Phenothiazines, Shellfish-derived products, Tequin, Penicillins, and Sulfa drugs cross reactors   Assessment / Plan:     Visit Diagnoses: Psoriatic arthropathy (HCC): She has ongoing pain and stiffness in both hands.  She has been experiencing intermittent swelling in her hands and wrist joints.  She has tenderness and synovitis over the right third MCP joint on examination today.  She has ongoing psoriasis on her scalp and over the extensor surface of the right elbow.  She has been using over-the-counter topical agents.  She declined a  prescription topical agent at this time.  Her psoriatic arthritis and psoriasis were previously well controlled on Cosentyx 300 mg subcutaneous injections every month.  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  At her last office visit on 08/17/2020 there was concern for history of osteomyelitis so Dr. Pamella Pert was hesitant to restart Cosentyx or any other immunosuppressive agents at that time.  The patient tried [place] but discontinued due to experiencing nausea, vomiting, and difficulty swallowing.  We received written clearance on 09/07/20 and verbal clearance from Dr. Andrey Campanile (orthopedic surgeon at [facility]) on 09/13/20 to restart on an immunosuppressive agent.  Different treatment options were discussed today in detail.  Since her psoriatic arthritis and psoriasis were previously well controlled on Cosentyx Dr. Corliss Skains recommended resuming Cosentyx as previously prescribed. Indications, contraindications, and potential side effects of cosentyx were discussed today in detail.  All questions were addressed and consent was updated today.  We will reapply for Cosentyx through her insurance and once approved she will return to the office for administration of the first injection. She will restart with the loading dose.  She will follow up in 6-8 weeks to assess her response to resuming cosentyx.   Medication counseling: TB Gold: Pending  Hepatitis panel: Hep C negative on 10/30/19. Hep B surface antigen negative.  Hep B surface antibody and core antibody reactive.  Future order for hep B DNA ultraquant placed to be drawn at new start visit.  HIV: medicare will not cover. Tried to release order several times. SPEP: 10/20/18 Immunoglobulin: Future order placed today.   Does patient have a history of inflammatory bowel disease? No Patient previously tolerated cosentyx without any side  effects.   Counseled patient that Cosentyx is a IL-17 inhibitor that works to reduce pain and inflammation associated with arthritis.  Counseled patient on purpose, proper use, and adverse effects of Cosentyx. Reviewed the most common adverse effects of infection, inflammatory bowel disease, and allergic reaction.  Reviewed the importance of regular labs while on Cosentyx.  Counseled patient that Cosentyx should be held prior to scheduled surgery.  Counseled patient to avoid live vaccines while on Cosentyx.  Advised patient to get annual influenza vaccine and the pneumococcal vaccine as indicated.  Provided patient with medication education material and answered all questions.  Patient consented to Cosentyx.  Will upload consent into patient's chart.  Will apply for Cosentyx through patient's insurance.  Reviewed storage information for Cosentyx.  Advised initial injection  must be administered in office.  Patient voiced understanding.     Other psoriasis: She has psoriasis on her scalp and on the extensor surface of the right elbow.  She declined a prescription for a topical agent at this time.  She will be restarting on Cosentyx as discussed above.  High risk medication use -Cosentyx 300 mg sq injections every 4 weeks.  Treated with Cosentyx 300 mg sq injections every 28 days in the past-Initially started on 09/27/15-d/c in June 2021.  Discontinued Otezla due to difficulty swallowing.  CBC and CMP will be drawn today prior to starting on Cosentyx.  TB Gold also updated today.  She will return for lab work in 1 month and every 3 months to monitor for drug toxicity while on Cosentyx.  Standing orders for CBC and CMP were placed today. Plan: QuantiFERON-TB Gold Plus, COMPLETE METABOLIC PANEL WITH GFR, CBC with Differential/Platelet Previously on MTX (elevated LFTs in 2017), Remicade-reaction, Enbrel, humira, otezla.  Allergy to sulfa  Discussed the importance of holding Cosentyx if she develops signs or  symptoms of an infection and to resume once the infection has completely cleared.  She voiced understanding.  Screening for tuberculosis -Order for TB gold placed today.  Plan: QuantiFERON-TB Gold Plus  Primary osteoarthritis of both hands: She has been experiencing increased pain and stiffness in both hands especially her right hand.  She has also noticed intermittent swelling.  She has PIP and DIP thickening consistent with osteoarthritis of both hands.  Tenderness and synovitis over the right third MCP joint noted.  Discussed the importance of joint protection and muscle strengthening.  History of total right knee replacement: Chronic pain.  She has good ROM on exam today.  No effusion noted.  She is primarily wheelchair bound but she has started to try to use a walker daily.  Age-related osteoporosis without current pathological fracture: No record of DEXA in Epic.  She was prescribed evenity by her PCP.  Fibromyalgia: She has intermittent myalgias and muscle tenderness due to underlying myofascial pain.  She continues to have chronic fatigue secondary to insomnia.  She has been more sedentary and is primarily wheelchair-bound since having an above-the-knee amputation of the left leg.  She has started to try to use a walker on a daily basis gradual improvement in her strength.  Chronic fatigue: Chronic and unchanged.  Primary insomnia: She experiences difficulty sleeping at night due to nocturnal pain.  Contracture of hand joint, right: History of right 4th trigger finger release-performed by Dr. Janee Morn.  Contracture on palmar aspect of hand has developed. Referral to Dr. Amanda Pea was placed today per patient request.   Other medical conditions are listed as follows:   History of diabetes mellitus  History of fracture of tibia: Dr. Earna Coder.   History of fibula fracture: Dr. Earna Coder   Dyslipidemia    Orders: Orders Placed This Encounter  Procedures   QuantiFERON-TB Gold Plus    COMPLETE METABOLIC PANEL WITH GFR   CBC with Differential/Platelet   No orders of the defined types were placed in this encounter.     Follow-Up Instructions: Return in about 8 weeks (around 11/15/2020) for Psoriatic arthritis, Osteoarthritis.   Gearldine Bienenstock, PA-C  Note - This record has been created using Dragon software.  Chart creation errors have been sought, but may not always  have been located. Such creation errors do not reflect on  the standard of medical care.

## 2020-09-15 ENCOUNTER — Ambulatory Visit: Payer: PRIVATE HEALTH INSURANCE | Admitting: Physician Assistant

## 2020-09-15 DIAGNOSIS — L408 Other psoriasis: Secondary | ICD-10-CM

## 2020-09-15 DIAGNOSIS — M19041 Primary osteoarthritis, right hand: Secondary | ICD-10-CM

## 2020-09-15 DIAGNOSIS — E785 Hyperlipidemia, unspecified: Secondary | ICD-10-CM

## 2020-09-15 DIAGNOSIS — Z8739 Personal history of other diseases of the musculoskeletal system and connective tissue: Secondary | ICD-10-CM

## 2020-09-15 DIAGNOSIS — R5382 Chronic fatigue, unspecified: Secondary | ICD-10-CM

## 2020-09-15 DIAGNOSIS — F5101 Primary insomnia: Secondary | ICD-10-CM

## 2020-09-15 DIAGNOSIS — M79671 Pain in right foot: Secondary | ICD-10-CM

## 2020-09-15 DIAGNOSIS — Z8781 Personal history of (healed) traumatic fracture: Secondary | ICD-10-CM

## 2020-09-15 DIAGNOSIS — M79641 Pain in right hand: Secondary | ICD-10-CM

## 2020-09-15 DIAGNOSIS — M810 Age-related osteoporosis without current pathological fracture: Secondary | ICD-10-CM

## 2020-09-15 DIAGNOSIS — Z79899 Other long term (current) drug therapy: Secondary | ICD-10-CM

## 2020-09-15 DIAGNOSIS — L4050 Arthropathic psoriasis, unspecified: Secondary | ICD-10-CM

## 2020-09-15 DIAGNOSIS — Z96653 Presence of artificial knee joint, bilateral: Secondary | ICD-10-CM

## 2020-09-15 DIAGNOSIS — M797 Fibromyalgia: Secondary | ICD-10-CM

## 2020-09-15 DIAGNOSIS — Z8639 Personal history of other endocrine, nutritional and metabolic disease: Secondary | ICD-10-CM

## 2020-09-20 ENCOUNTER — Telehealth: Payer: Self-pay

## 2020-09-20 ENCOUNTER — Encounter: Payer: Self-pay | Admitting: Physician Assistant

## 2020-09-20 ENCOUNTER — Other Ambulatory Visit: Payer: Self-pay

## 2020-09-20 ENCOUNTER — Ambulatory Visit (INDEPENDENT_AMBULATORY_CARE_PROVIDER_SITE_OTHER): Payer: Medicare Other | Admitting: Physician Assistant

## 2020-09-20 VITALS — BP 121/68 | HR 66 | Resp 13 | Ht 66.5 in

## 2020-09-20 DIAGNOSIS — L4050 Arthropathic psoriasis, unspecified: Secondary | ICD-10-CM

## 2020-09-20 DIAGNOSIS — L408 Other psoriasis: Secondary | ICD-10-CM

## 2020-09-20 DIAGNOSIS — M24541 Contracture, right hand: Secondary | ICD-10-CM

## 2020-09-20 DIAGNOSIS — E785 Hyperlipidemia, unspecified: Secondary | ICD-10-CM

## 2020-09-20 DIAGNOSIS — Z96653 Presence of artificial knee joint, bilateral: Secondary | ICD-10-CM

## 2020-09-20 DIAGNOSIS — Z111 Encounter for screening for respiratory tuberculosis: Secondary | ICD-10-CM

## 2020-09-20 DIAGNOSIS — Z8639 Personal history of other endocrine, nutritional and metabolic disease: Secondary | ICD-10-CM

## 2020-09-20 DIAGNOSIS — Z79899 Other long term (current) drug therapy: Secondary | ICD-10-CM

## 2020-09-20 DIAGNOSIS — R5382 Chronic fatigue, unspecified: Secondary | ICD-10-CM

## 2020-09-20 DIAGNOSIS — Z8781 Personal history of (healed) traumatic fracture: Secondary | ICD-10-CM

## 2020-09-20 DIAGNOSIS — M19041 Primary osteoarthritis, right hand: Secondary | ICD-10-CM | POA: Diagnosis not present

## 2020-09-20 DIAGNOSIS — M810 Age-related osteoporosis without current pathological fracture: Secondary | ICD-10-CM

## 2020-09-20 DIAGNOSIS — R768 Other specified abnormal immunological findings in serum: Secondary | ICD-10-CM

## 2020-09-20 DIAGNOSIS — F5101 Primary insomnia: Secondary | ICD-10-CM

## 2020-09-20 DIAGNOSIS — M19042 Primary osteoarthritis, left hand: Secondary | ICD-10-CM

## 2020-09-20 DIAGNOSIS — M797 Fibromyalgia: Secondary | ICD-10-CM

## 2020-09-20 DIAGNOSIS — Z96651 Presence of right artificial knee joint: Secondary | ICD-10-CM

## 2020-09-20 DIAGNOSIS — Z114 Encounter for screening for human immunodeficiency virus [HIV]: Secondary | ICD-10-CM

## 2020-09-20 NOTE — Telephone Encounter (Signed)
Please apply for cosentyx per [name], PA-C. Thanks!  ? ?Consent obtained and sent to the scan center.  ? ? ?

## 2020-09-20 NOTE — Patient Instructions (Signed)
Standing Labs We placed an order today for your standing lab work.   Please have your standing labs drawn in 1 month then every 3 months  If possible, please have your labs drawn 2 weeks prior to your appointment so that the provider can discuss your results at your appointment.  Please note that you may see your imaging and lab results in MyChart before we have reviewed them. We may be awaiting multiple results to interpret others before contacting you. Please allow our office up to 72 hours to thoroughly review all of the results before contacting the office for clarification of your results.  We have open lab daily: Monday through Thursday from 1:30-4:30 PM and Friday from 1:30-4:00 PM at the office of Dr. Pollyann Savoy, [facility].   Please be advised, all patients with office appointments requiring lab work will take precedent over walk-in lab work.  If possible, please come for your lab work on Monday and Friday afternoons, as you may experience shorter wait times. The office is located at [address], [address], [place], [place] [address] No appointment is necessary.   Labs are drawn by [facility]. Please bring your co-pay at the time of your lab draw.  You may receive a bill from [facility] for your lab work.  If you wish to have your labs drawn at another location, please call the office 24 hours in advance to send orders.  If you have any questions regarding directions or hours of operation,  please call [phone].   As a reminder, please drink plenty of water prior to coming for your lab work. Thanks!   Secukinumab injection What is this medication? SECUKINUMAB (sek ue KIN ue mab) is used to treat psoriasis. It is also used to treat psoriatic arthritis, enthesitis-related arthritis, ankylosingspondylitis, and active non-radiographic axial spondyloarthritis. This medicine may be used for other purposes; ask your health care provider orpharmacist if you have  questions. COMMON BRAND NAME(S): Cosentyx What should I tell my care team before I take this medication? They need to know if you have any of these conditions: Crohn's disease, ulcerative colitis, or other inflammatory bowel disease immune system problems infection or history of infection (especially a viral infection such as chickenpox, cold sores, or herpes) recently received or are scheduled to receive a vaccine tuberculosis, a positive skin test for tuberculosis, or have recently been in close contact with someone who has tuberculosis an unusual or allergic reaction to secukinumab, other medicines, latex, rubber, foods, dyes, or preservatives pregnant or trying to get pregnant breast-feeding How should I use this medication? This medicine is injected under the skin. It may be administered by a health care provider in a hospital or clinic setting or at home. If you get this medicine at home, you will be taught how to prepare and give it. Use exactly as directed. Take it as directed on the prescription label at the same time everyday. Keep taking it unless your health care provider tells you to stop. It is important that you put your used needles and syringes in a special sharps container. Do not put them in a trash can. If you do not have a sharpscontainer, call your pharmacist or healthcare provider to get one. A special MedGuide will be given to you by the pharmacist with eachprescription and refill. Be sure to read this information carefully each time. Talk to your health care provider about the use of this medicine in children. While it may be prescribed for children  as young as 2 years for selectedconditions, precautions do apply. Overdosage: If you think you have taken too much of this medicine contact apoison control center or emergency room at once. NOTE: This medicine is only for you. Do not share this medicine with others. What if I miss a dose? It is important not to miss your dose.  Call your doctor of health care professional if you are unable to keep an appointment. If you give yourself the medicine and you miss a dose, take it as soon as you can. If it is almost timefor your next dose, take only that dose. Do not take double or extra doses. What may interact with this medication? Do not take this medicine with any of the following medications: live virus vaccines This medicine may also interact with the following medications: cyclosporine inactivated vaccines warfarin This list may not describe all possible interactions. Give your health care provider a list of all the medicines, herbs, non-prescription drugs, or dietary supplements you use. Also tell them if you smoke, drink alcohol, or use illegaldrugs. Some items may interact with your medicine. What should I watch for while using this medication? Tell your doctor or healthcare professional if your symptoms do not start toget better or if they get worse. You will be tested for tuberculosis (TB) before you start this medicine. If your doctor prescribes any medicine for TB, you should start taking the TB medicine before starting this medicine. Make sure to finish the full course ofTB medicine. This medicine may increase your risk of getting an infection. Call your doctor or health care professional for advice if you get a fever, chills or sore throat, or other symptoms of a cold or flu. Do not treat yourself. Try to avoidbeing around people who are sick. This medicine can decrease the response to a vaccine. If you need to get vaccinated, tell your healthcare professional if you have received this medicine within the last 6 months. Extra booster doses may be needed. Talk toyour doctor to see if a different vaccination schedule is needed. What side effects may I notice from receiving this medication? Side effects that you should report to your doctor or health care professionalas soon as possible: allergic reactions like skin  rash, itching or hives, swelling of the face, lips, or tongue breathing problems feeling faint or lightheaded signs and symptoms of bowel problems like abdominal pain, diarrhea, blood in the stool, and weight loss signs and symptoms of infection like fever or chills; cough; sore throat; pain or trouble passing urine Side effects that usually do not require medical attention (report to yourdoctor or health care professional if they continue or are bothersome): pain, redness, or irritation at site where injected stuffy or runny nose This list may not describe all possible side effects. Call your doctor for medical advice about side effects. You may report side effects to FDA at1-800-FDA-1088. Where should I keep my medication? Keep out of the reach of children and pets. Store the prefilled syringe or injection pen in a refrigerator between 2 to 8 degrees C (36 to 46 degrees F). Keep the syringe or the pen in the original carton until ready for use. Protect from light. Do not freeze. Do not shake. Prior to use, remove the syringe or pen from the refrigerator and use within 4days. Get rid of any unused medicine after the expiration date. To get rid of medicines that are no longer needed or have expired: Take the medicine to a medicine take-back  program. Check with your pharmacy or law enforcement to find a location. If you cannot return the medicine, ask your pharmacist or health care provider how to get rid of this medicine safely. NOTE: This sheet is a summary. It may not cover all possible information. If you have questions about this medicine, talk to your doctor, pharmacist, orhealth care provider.  2022 Elsevier/Gold Standard (2020-02-11 13:31:17)

## 2020-09-21 NOTE — Progress Notes (Signed)
CBC WNL.  Glucose is elevated-289. Calcium is slightly low likely due to low albumin.  We will continue to monitor.  Rest of CMP WNL.

## 2020-09-22 LAB — QUANTIFERON-TB GOLD PLUS
Mitogen-NIL: >10 IU/mL
NIL: 0.04 IU/mL
QuantiFERON-TB Gold Plus: NEGATIVE
TB1-NIL: <0 IU/mL
TB2-NIL: <0 IU/mL

## 2020-09-22 LAB — CBC WITH DIFFERENTIAL/PLATELET
Absolute Monocytes: 540 cells/uL (ref 200–950)
Basophils Absolute: 27 cells/uL (ref 0–200)
Basophils Relative: 0.3 %
Eosinophils Absolute: 189 cells/uL (ref 15–500)
Eosinophils Relative: 2.1 %
HCT: 40.5 % (ref 35.0–45.0)
Hemoglobin: 13.1 g/dL (ref 11.7–15.5)
Lymphs Abs: 2394 cells/uL (ref 850–3900)
MCH: 28.2 pg (ref 27.0–33.0)
MCHC: 32.3 g/dL (ref 32.0–36.0)
MCV: 87.3 fL (ref 80.0–100.0)
MPV: 10.5 fL (ref 7.5–12.5)
Monocytes Relative: 6 %
Neutro Abs: 5850 cells/uL (ref 1500–7800)
Neutrophils Relative %: 65 %
Platelets: 255 10*3/uL (ref 140–400)
RBC: 4.64 10*6/uL (ref 3.80–5.10)
RDW: 12.2 % (ref 11.0–15.0)
Total Lymphocyte: 26.6 %
WBC: 9 10*3/uL (ref 3.8–10.8)

## 2020-09-22 LAB — COMPLETE METABOLIC PANEL WITH GFR
AG Ratio: 1.2 (calc) (ref 1.0–2.5)
ALT: 8 U/L (ref 6–29)
AST: 12 U/L (ref 10–35)
Albumin: 3.4 g/dL — ABNORMAL LOW (ref 3.6–5.1)
Alkaline phosphatase (APISO): 81 U/L (ref 37–153)
BUN: 10 mg/dL (ref 7–25)
CO2: 30 mmol/L (ref 20–32)
Calcium: 8.5 mg/dL — ABNORMAL LOW (ref 8.6–10.4)
Chloride: 102 mmol/L (ref 98–110)
Creat: 0.87 mg/dL (ref 0.50–1.05)
Globulin: 2.9 g/dL (calc) (ref 1.9–3.7)
Glucose, Bld: 289 mg/dL — ABNORMAL HIGH (ref 65–99)
Potassium: 4.5 mmol/L (ref 3.5–5.3)
Sodium: 139 mmol/L (ref 135–146)
Total Bilirubin: 0.4 mg/dL (ref 0.2–1.2)
Total Protein: 6.3 g/dL (ref 6.1–8.1)
eGFR: 72 mL/min/{1.73_m2} (ref >=60)

## 2020-09-22 NOTE — Progress Notes (Signed)
TB gold negative.  Ok to schedule visit to restart cosentyx in the office.

## 2020-09-23 ENCOUNTER — Other Ambulatory Visit (HOSPITAL_COMMUNITY): Payer: Self-pay

## 2020-09-23 NOTE — Telephone Encounter (Signed)
Received notification from [facility] regarding a prior authorization for COSENTYX. Authorization has been APPROVED through 02/18/21.   Patient can fill through [facility]: [phone]   Copay is $0 for loading dose.  Will use sample for re-start visit on 09/26/20  Chesley Mires, PharmD, MPH, BCPS Clinical Pharmacist (Rheumatology and Pulmonology)

## 2020-09-23 NOTE — Telephone Encounter (Signed)
Cosentyx Dose: 300 mg once weekly at weeks 0, 1, 2, 3, and 4 followed by 300 mg every 4 weeks  Dx: PsA (L40.5), psoriasis (L40.9)  Previously tried therapies: Cosentyx Laurene Footman, PharmD, MPH, BCPS Clinical Pharmacist (Rheumatology and Pulmonology)

## 2020-09-23 NOTE — Telephone Encounter (Signed)
Submitted a Prior Authorization request to [facility] for COSENTYX via CoverMyMeds. Will update once we receive a response.   Key: [id number]

## 2020-09-26 ENCOUNTER — Ambulatory Visit: Payer: Medicare Other | Admitting: Pharmacist

## 2020-09-26 NOTE — Telephone Encounter (Signed)
Patient r/s Cosentyx appt from today to 09/27/20.

## 2020-09-27 ENCOUNTER — Other Ambulatory Visit (HOSPITAL_COMMUNITY): Payer: Self-pay

## 2020-09-27 ENCOUNTER — Ambulatory Visit: Payer: Medicare Other | Admitting: Pharmacist

## 2020-09-27 ENCOUNTER — Other Ambulatory Visit: Payer: Self-pay

## 2020-09-27 VITALS — BP 131/65 | HR 68

## 2020-09-27 DIAGNOSIS — L4050 Arthropathic psoriasis, unspecified: Secondary | ICD-10-CM

## 2020-09-27 MED ORDER — COSENTYX SENSOREADY (300 MG) 150 MG/ML ~~LOC~~ SOAJ
SUBCUTANEOUS | 0 refills | Status: DC
Start: 2020-09-27 — End: 2021-09-20
  Filled 2020-09-27: qty 8, fill #0
  Filled 2020-09-27: qty 8, 28d supply, fill #0

## 2020-09-27 MED ORDER — COSENTYX (300 MG DOSE) 150 MG/ML ~~LOC~~ SOSY
300.0000 mg | PREFILLED_SYRINGE | SUBCUTANEOUS | 0 refills | Status: DC
  Filled 2020-09-27: qty 4, fill #0
  Filled 2020-11-07: qty 2, 28d supply, fill #0
  Filled 2020-12-16: qty 2, 28d supply, fill #1

## 2020-09-27 MED ORDER — COSENTYX SENSOREADY (300 MG) 150 MG/ML ~~LOC~~ SOAJ
SUBCUTANEOUS | 0 refills | Status: DC
  Filled 2020-09-27: qty 12, fill #0

## 2020-09-27 NOTE — Progress Notes (Addendum)
Pharmacy Note  Subjective:   Patient presents to clinic today to restart Cosentyx. We transitioned her to [place] on 08/29/20 due to waning clinical response to Cosentyx. However patient had throat swelling. Decision made to switch her back to Cosentyx and reload.  Patient running a fever or have signs/symptoms of infection? No  Patient currently on antibiotics for the treatment of infection? No  Patient have any upcoming invasive procedures/surgeries? No  Objective: CMP     Component Value Date/Time   NA 139 09/20/2020 1343   K 4.5 09/20/2020 1343   CL 102 09/20/2020 1343   CO2 30 09/20/2020 1343   GLUCOSE 289 (H) 09/20/2020 1343   BUN 10 09/20/2020 1343   CREATININE 0.87 09/20/2020 1343   CALCIUM 8.5 (L) 09/20/2020 1343   PROT 6.3 09/20/2020 1343   ALBUMIN 3.8 07/18/2016 1355   AST 12 09/20/2020 1343   ALT 8 09/20/2020 1343   ALKPHOS 70 07/18/2016 1355   BILITOT 0.4 09/20/2020 1343   GFRNONAA 63 08/17/2020 1454   GFRAA 73 08/17/2020 1454    CBC    Component Value Date/Time   WBC 9.0 09/20/2020 1343   RBC 4.64 09/20/2020 1343   HGB 13.1 09/20/2020 1343   HGB 13.1 10/20/2008 1415   HCT 40.5 09/20/2020 1343   HCT 39.0 10/20/2008 1415   PLT 255 09/20/2020 1343   PLT 320 10/20/2008 1415   MCV 87.3 09/20/2020 1343   MCV 92 10/20/2008 1415   MCH 28.2 09/20/2020 1343   MCHC 32.3 09/20/2020 1343   RDW 12.2 09/20/2020 1343   RDW 12.1 10/20/2008 1415   LYMPHSABS 2,394 09/20/2020 1343   LYMPHSABS 2.4 10/20/2008 1415   MONOABS 819 07/18/2016 1355   EOSABS 189 09/20/2020 1343   EOSABS 0.1 10/20/2008 1415   BASOSABS 27 09/20/2020 1343   BASOSABS 0.1 10/20/2008 1415    Baseline Immunosuppressant Therapy Labs TB GOLD Quantiferon TB Gold Latest Ref Rng & Units 09/20/2020  Quantiferon TB Gold Plus NEGATIVE NEGATIVE   Hepatitis Panel 10/30/19 - negative Hep C, negative Hep B surface antigen.  Hep B surface antibody and core antibody reactive  HIV: Medicare will not  cover  Immunoglobulins 10/20/2018  SPEP Serum Protein Electrophoresis Latest Ref Rng & Units 09/20/2020  Total Protein 6.1 - 8.1 g/dL 6.3   Chest x-ray: 01/0/9323 - no active lung disease. Stable mild cardiomegaly  Assessment/Plan:  Patient able to demonstrate proper injection technique and self-injected in the left and right thigh with:  Sample Medication: Cosentyx 150mg /mL Sensoready Pens x 2 NDC: Lot: [id number] Expiration: 07/2021  Patient tolerated well.  Observed for 30 mins in office for adverse reaction and noe noted.   Patient is to return in 1 month for labs and 6-8 weeks for follow-up appointment.  Standing orders placed. Will also draw immunoglobulins and Hep B DNA at 41-month labs.   Cosentyx approved through insurance .   Rx sent to: [facility]: [phone] .  Patient provided with pharmacy phone number and advised to call later this week to schedule shipment to home.  She will continue loading dose of 300mg  at Week 0 (received today in clinic), Week 1, Week 2, Week 3, and Week 4, then 300mg  every 4 weeks thereafter  All questions encouraged and answered.  Instructed patient to call with any further questions or concerns.  [phone], PharmD, MPH, BCPS Clinical Pharmacist (Rheumatology and Pulmonology)  09/27/2020 10:34 AM

## 2020-09-27 NOTE — Patient Instructions (Signed)
Your next Cosentyx dose is due on: 8/16 8/23 8/30 9/5 Then every 4 weeks starting 10/4  Your prescription will be shipped from [facility]. Their phone number is [phone].    Labs are due in 1 month then every 3 months. Lab hours are from Monday to Thursday 1:30-4:30pm and Friday 1:30-4pm. You do not need an appointment if you come for labs during these times.  How to manage an injection site reaction: Remember the 5 C's: COUNTER - leave on the counter at least 30 minutes but up to overnight to bring medication to room temperature. This may help prevent stinging COLD - place something cold (like an ice gel pack or cold water bottle) on the injection site just before cleansing with alcohol. This may help reduce pain CLARITIN - use Claritin (generic name is loratadine) for the first two weeks of treatment or the day of, the day before, and the day after injecting. This will help to minimize injection site reactions CORTISONE CREAM - apply if injection site is irritated and itching CALL ME - if injection site reaction is bigger than the size of your fist, looks infected, blisters, or if you develop hives

## 2020-09-28 ENCOUNTER — Other Ambulatory Visit (HOSPITAL_COMMUNITY): Payer: Self-pay

## 2020-09-28 ENCOUNTER — Ambulatory Visit: Payer: PRIVATE HEALTH INSURANCE | Admitting: Physician Assistant

## 2020-10-25 ENCOUNTER — Other Ambulatory Visit (HOSPITAL_COMMUNITY): Payer: Self-pay

## 2020-10-28 ENCOUNTER — Other Ambulatory Visit (HOSPITAL_COMMUNITY): Payer: Self-pay

## 2020-11-02 NOTE — Progress Notes (Signed)
Office Visit Note  Patient: [name]             Date of Birth: 07-19-1950           MRN: [id number]             PCP: Jamal Collin, PA-C Referring: Jamal Collin, PA-C Visit Date: 11/16/2020 Occupation: @GUAROCC @  Subjective:  Medication monitoring   History of Present Illness: [name] is a 70 y.o. female with history of psoriatic arthritis, osteoarthritis, fibromyalgia, and osteoporosis. She is on cosentyx 300 mg sq injections every 4 weeks. She restarted on 09/27/20.  She has been tolerating cosentyx without any side effects or injection site reactions.  She states she has noticed a significant improvement in her joint pain, stiffness, and scalp psoriasis since restarting cosentyx.  She denies any SI joint pain, achilles tendonitis or plantar fasciitis.  She continues to have stiffness in both hands but denies any joint swelling.  She denies any recent infections.      Activities of Daily Living:  Patient reports morning stiffness for 3-4 hours.   Patient Reports nocturnal pain.  Difficulty dressing/grooming: Denies Difficulty climbing stairs: Reports Difficulty getting out of chair: Denies Difficulty using hands for taps, buttons, cutlery, and/or writing: Reports  Review of Systems  Constitutional:  Negative for fatigue.  HENT:  Negative for mouth sores, mouth dryness and nose dryness.   Eyes:  Positive for dryness. Negative for pain and itching.  Respiratory:  Negative for shortness of breath and difficulty breathing.   Cardiovascular:  Negative for chest pain and palpitations.  Gastrointestinal:  Negative for blood in stool, constipation and diarrhea.  Endocrine: Negative for increased urination.  Genitourinary:  Negative for difficulty urinating.  Musculoskeletal:  Positive for joint pain, joint pain, joint swelling, myalgias, morning stiffness, muscle tenderness and myalgias.  Skin:  Positive for rash. Negative for color change and redness.   Allergic/Immunologic: Negative for susceptible to infections.  Neurological:  Negative for dizziness, numbness, headaches, memory loss and weakness.  Hematological:  Positive for bruising/bleeding tendency.  Psychiatric/Behavioral:  Negative for confusion.    PMFS History:  Patient Active Problem List   Diagnosis Date Noted   Dyslipidemia 10/16/2016   History of total knee replacement, bilateral 10/16/2016   Primary osteoarthritis of both hands 10/16/2016   History of diabetes mellitus 10/16/2016   Other chronic pain 10/16/2016   History of glaucoma 10/16/2016   Psoriatic arthropathy (HCC) 07/06/2016   Other psoriasis 07/06/2016   High risk medications (not anticoagulants) long-term use 07/06/2016   Fibromyalgia 07/06/2016   Chronic fatigue 07/06/2016   Primary insomnia 07/06/2016    Past Medical History:  Diagnosis Date   Arthritis    Cataract    Diabetes mellitus without complication (HCC)    Glaucoma    Hypertension    Vertigo     Family History  Problem Relation Age of Onset   Cancer Sister    Diabetes Sister    COPD Brother    Diabetes Brother    Past Surgical History:  Procedure Laterality Date   ABDOMINAL HYSTERECTOMY     APPENDECTOMY     CARPAL TUNNEL RELEASE     CHOLECYSTECTOMY     FEMUR FRACTURE SURGERY Right    FIBULA FRACTURE SURGERY Left 09/01/2018   LEG AMPUTATION Left 03/25/2020   REPLACEMENT TOTAL KNEE BILATERAL     TIBIA FRACTURE SURGERY Left 09/01/2018   TOTAL SHOULDER ARTHROPLASTY     TRIGGER FINGER RELEASE  TRIGGER FINGER RELEASE Right 02/2017   3rd and 4th digits.   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  Constitutional:      Appearance: She is well-developed.  HENT:     Head: Normocephalic and atraumatic.  Eyes:     Conjunctiva/sclera: Conjunctivae normal.  Pulmonary:     Effort: Pulmonary effort is normal.  Abdominal:     Palpations: Abdomen is soft.  Musculoskeletal:     Cervical back: Normal range of motion.  Skin:    General: Skin is warm and dry.     Capillary Refill: Capillary refill takes less than 2 seconds.  Neurological:     Mental Status: She is alert and oriented to person, place, and time.  Psychiatric:        Behavior: Behavior normal.     Musculoskeletal Exam: Patient remained in wheelchair during examination.  C-spine has good range of motion with no discomfort.  No midline spinal tenderness or SI joint tenderness noted.  Shoulder joints, elbow joints, wrist joints, MCPs, PIPs, DIPs have good range of motion with no synovitis.  Complete fist formation bilaterally.  PIP and DIP thickening consistent with osteoarthritis of both hands.  Hip joints difficult to assess in seated position.  Right knee replacement has good range of motion with no discomfort.  Above-the-knee amputation of left leg.   CDAI Exam: CDAI Score: -- Patient Global: --; Provider Global: -- Swollen: --; Tender: -- Joint Exam 11/16/2020   No joint exam has been documented for this visit   There is currently no information documented on the homunculus. Go to the Rheumatology activity and complete the homunculus joint exam.  Investigation: No additional findings.  Imaging: No results found.  Recent Labs: Lab Results  Component Value Date   WBC 9.0 09/20/2020   HGB 13.1 09/20/2020   PLT 255 09/20/2020   NA 139 09/20/2020   K 4.5 09/20/2020   CL 102 09/20/2020   CO2 30 09/20/2020   GLUCOSE 289 (H) 09/20/2020   BUN 10 09/20/2020   CREATININE 0.87 09/20/2020   BILITOT 0.4 09/20/2020   ALKPHOS 70 07/18/2016   AST 12 09/20/2020    ALT 8 09/20/2020   PROT 6.3 09/20/2020   ALBUMIN 3.8 07/18/2016   CALCIUM 8.5 (L) 09/20/2020   GFRAA 73 08/17/2020   QFTBGOLDPLUS NEGATIVE 09/20/2020    Speciality Comments: [place] started 08/24/20  Procedures:  No procedures performed Allergies: Apremilast, Aspirin, Bee venom, Cephalosporins, Clarithromycin, Codeine, Demerol, E-mycin [erythromycin base], Etodolac, Gatifloxacin, Iodinated diagnostic agents, Levaquin  [levofloxacin in d5w], Levofloxacin, Levofloxacin, Lyrica [pregabalin], Meperidine, Morphine and related, Nsaids, Other, Phenothiazines, Shellfish-derived products, Tequin, Penicillins, and Sulfa drugs cross reactors   Assessment / Plan:     Visit Diagnoses: Psoriatic arthropathy (HCC) - She has no synovitis or dactylitis on examination today.  She has noticed significant clinical improvement in her joint pain and inflammation since restarting on Cosentyx on 09/27/2020.  She has been tolerating Cosentyx without any side effects, injection site reactions, or infections.  The psoriasis on her scalp has been improving and  the patches on the extensor surface of her elbows has resolved.  She is not experiencing any Achilles tendinitis or plantar fasciitis in her right foot.  She has no SI joint tenderness on examination today.  She continues to experience some stiffness in both hands but has no inflammation on examination today.  She will remain on Cosentyx 300 mg subcutaneous injections every 4 weeks.  She was advised to notify us if she develops increased joint pain or joint swelling.  She will follow-up in the office in 5 months.  Other psoriasis: The psoriasis on her scalp as been improving since restarting cosentyx.  The patches of psoriasis on the extensor surface of her elbows has cleared.  She will continue on cosentyx as prescribed.   High risk medication use - Cosentyx 300 mg sq injections every 4 weeks.  Initially started on 09/27/15-d/c in June 2021. Restarted on cosentyx on  09/27/20. - Plan: CBC with Differential/Platelet, COMPLETE METABOLIC PANEL WITH GFR, IgG, IgA, IgM, Hepatitis B core antibody, IgM, Hepatitis B surface antigen CBC and CMP updated on 09/20/20.  CBC and CMP will be updated today and then in every 3 months while on cosentyx. Standing orders for CBC and CMP are in place. TB gold negative on 09/20/20.  She denies any recent infections.  Discussed the importance of holding cosentyx if she develops signs or symptoms of an infection and to resume once the infection has completely cleared.   Previously on MTX (elevated LFTs in 2017), Remicade-reaction, Enbrel, humira, otezla.  Allergy to sulfa,Discontinued Otezla due to difficulty swallowing.   Primary osteoarthritis of both hands: She has PIP and DIP thickening consistent with osteoarthritis of both hands.  No tenderness or inflammation was noted on examination today.  Complete fist formation bilaterally. Discussed the importance of regular exercise and good sleep hygiene.   History of total right knee replacement: Doing well.  Good range of motion with no discomfort.  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 We received written clearance on 09/07/20 and verbal clearance from Dr. Andrey Campanile (orthopedic surgeon at [facility]) on 09/13/20 to restart on an immunosuppressive agent.    Age-related osteoporosis without current pathological fracture: No record of DEXA in Epic.  She is currently being prescribed prolia by her PCP.  She takes vitamin D 1000 units daily.  Fibromyalgia: She experiences intermittent myalgias and muscle tenderness due to fibromyalgia.  She has noticed some increased aching over the past several days with colder weather temperatures.  She continues to take cymbalta and gabapentin as prescribed.   Primary insomnia: She has started to sleep better at night.   Chronic fatigue: Stable  Contracture of hand joint,  right: History of right 4th trigger finger release performed by Dr. Janee Morn.  Patient has an upcoming appointment scheduled with Dr. Amanda Pea for evaluation of the contracture that has formed on the palmar aspect of the right hand.   History of fracture of tibia: Dr. Earna Coder  History of fibula fracture: Dr. Earna Coder.   Other medical conditions are listed as follows:   History of diabetes mellitus  Dyslipidemia  Orders: Orders Placed This Encounter  Procedures   CBC with Differential/Platelet   COMPLETE METABOLIC PANEL WITH GFR   IgG, IgA, IgM   Hepatitis B core antibody, IgM   Hepatitis B surface antigen   No orders of the defined types were placed in this encounter.    Follow-Up Instructions: Return in  about 5 months (around 04/18/2021) for Psoriatic arthritis, Fibromyalgia, Osteoporosis.   Gearldine Bienenstock, PA-C  Note - This record has been created using Dragon software.  Chart creation errors have been sought, but may not always  have been located. Such creation errors do not reflect on  the standard of medical care.

## 2020-11-07 ENCOUNTER — Other Ambulatory Visit (HOSPITAL_COMMUNITY): Payer: Self-pay

## 2020-11-15 ENCOUNTER — Ambulatory Visit: Payer: PRIVATE HEALTH INSURANCE | Admitting: Physician Assistant

## 2020-11-16 ENCOUNTER — Encounter: Payer: Self-pay | Admitting: Physician Assistant

## 2020-11-16 ENCOUNTER — Ambulatory Visit (INDEPENDENT_AMBULATORY_CARE_PROVIDER_SITE_OTHER): Payer: Medicare Other | Admitting: Physician Assistant

## 2020-11-16 ENCOUNTER — Other Ambulatory Visit: Payer: Self-pay

## 2020-11-16 VITALS — BP 122/72 | HR 55 | Ht 66.5 in | Wt 225.0 lb

## 2020-11-16 DIAGNOSIS — Z96651 Presence of right artificial knee joint: Secondary | ICD-10-CM

## 2020-11-16 DIAGNOSIS — M19041 Primary osteoarthritis, right hand: Secondary | ICD-10-CM

## 2020-11-16 DIAGNOSIS — L408 Other psoriasis: Secondary | ICD-10-CM

## 2020-11-16 DIAGNOSIS — S78112A Complete traumatic amputation at level between left hip and knee, initial encounter: Secondary | ICD-10-CM

## 2020-11-16 DIAGNOSIS — Z8781 Personal history of (healed) traumatic fracture: Secondary | ICD-10-CM

## 2020-11-16 DIAGNOSIS — Z79899 Other long term (current) drug therapy: Secondary | ICD-10-CM | POA: Diagnosis not present

## 2020-11-16 DIAGNOSIS — Z8639 Personal history of other endocrine, nutritional and metabolic disease: Secondary | ICD-10-CM

## 2020-11-16 DIAGNOSIS — M810 Age-related osteoporosis without current pathological fracture: Secondary | ICD-10-CM

## 2020-11-16 DIAGNOSIS — F5101 Primary insomnia: Secondary | ICD-10-CM

## 2020-11-16 DIAGNOSIS — L4050 Arthropathic psoriasis, unspecified: Secondary | ICD-10-CM | POA: Diagnosis not present

## 2020-11-16 DIAGNOSIS — M19042 Primary osteoarthritis, left hand: Secondary | ICD-10-CM

## 2020-11-16 DIAGNOSIS — R5382 Chronic fatigue, unspecified: Secondary | ICD-10-CM

## 2020-11-16 DIAGNOSIS — E785 Hyperlipidemia, unspecified: Secondary | ICD-10-CM

## 2020-11-16 DIAGNOSIS — M797 Fibromyalgia: Secondary | ICD-10-CM

## 2020-11-16 DIAGNOSIS — M24541 Contracture, right hand: Secondary | ICD-10-CM

## 2020-11-16 NOTE — Patient Instructions (Signed)
Standing Labs We placed an order today for your standing lab work.   Please have your standing labs drawn in December and every 3 months    If possible, please have your labs drawn 2 weeks prior to your appointment so that the provider can discuss your results at your appointment.  Please note that you may see your imaging and lab results in MyChart before we have reviewed them. We may be awaiting multiple results to interpret others before contacting you. Please allow our office up to 72 hours to thoroughly review all of the results before contacting the office for clarification of your results.  We have open lab daily: Monday through Thursday from 1:30-4:30 PM and Friday from 1:30-4:00 PM at the office of Dr. Shaili Deveshwar, [facility].   Please be advised, all patients with office appointments requiring lab work will take precedent over walk-in lab work.  If possible, please come for your lab work on Monday and Friday afternoons, as you may experience shorter wait times. The office is located at [address], [address], [place], [place] [address] No appointment is necessary.   Labs are drawn by [facility]. Please bring your co-pay at the time of your lab draw.  You may receive a bill from [facility] for your lab work.  If you wish to have your labs drawn at another location, please call the office 24 hours in advance to send orders.  If you have any questions regarding directions or hours of operation,  please call [phone].   As a reminder, please drink plenty of water prior to coming for your lab work. Thanks!  

## 2020-11-17 ENCOUNTER — Other Ambulatory Visit (HOSPITAL_COMMUNITY): Payer: Self-pay

## 2020-11-17 LAB — IGG, IGA, IGM
IgG (Immunoglobin G), Serum: 947 mg/dL (ref 600–1540)
IgM, Serum: 83 mg/dL (ref 50–300)
Immunoglobulin A: 559 mg/dL — ABNORMAL HIGH (ref 70–320)

## 2020-11-17 LAB — COMPLETE METABOLIC PANEL WITH GFR
AG Ratio: 1.4 (calc) (ref 1.0–2.5)
ALT: 16 U/L (ref 6–29)
AST: 10 U/L (ref 10–35)
Albumin: 4.1 g/dL (ref 3.6–5.1)
Alkaline phosphatase (APISO): 67 U/L (ref 37–153)
BUN: 17 mg/dL (ref 7–25)
CO2: 31 mmol/L (ref 20–32)
Calcium: 10.1 mg/dL (ref 8.6–10.4)
Chloride: 100 mmol/L (ref 98–110)
Creat: 1 mg/dL (ref 0.50–1.05)
Globulin: 3 g/dL (calc) (ref 1.9–3.7)
Glucose, Bld: 179 mg/dL — ABNORMAL HIGH (ref 65–99)
Potassium: 4.9 mmol/L (ref 3.5–5.3)
Sodium: 140 mmol/L (ref 135–146)
Total Bilirubin: 0.5 mg/dL (ref 0.2–1.2)
Total Protein: 7.1 g/dL (ref 6.1–8.1)
eGFR: 61 mL/min/{1.73_m2} (ref >=60)

## 2020-11-17 LAB — HEPATITIS B SURFACE ANTIGEN: Hepatitis B Surface Ag: NONREACTIVE

## 2020-11-17 LAB — CBC WITH DIFFERENTIAL/PLATELET
Absolute Monocytes: 651 cells/uL (ref 200–950)
Basophils Absolute: 32 cells/uL (ref 0–200)
Basophils Relative: 0.3 %
Eosinophils Absolute: 168 cells/uL (ref 15–500)
Eosinophils Relative: 1.6 %
HCT: 45.4 % — ABNORMAL HIGH (ref 35.0–45.0)
Hemoglobin: 14.8 g/dL (ref 11.7–15.5)
Lymphs Abs: 3056 cells/uL (ref 850–3900)
MCH: 28.2 pg (ref 27.0–33.0)
MCHC: 32.6 g/dL (ref 32.0–36.0)
MCV: 86.5 fL (ref 80.0–100.0)
MPV: 10.8 fL (ref 7.5–12.5)
Monocytes Relative: 6.2 %
Neutro Abs: 6594 cells/uL (ref 1500–7800)
Neutrophils Relative %: 62.8 %
Platelets: 268 10*3/uL (ref 140–400)
RBC: 5.25 10*6/uL — ABNORMAL HIGH (ref 3.80–5.10)
RDW: 11.9 % (ref 11.0–15.0)
Total Lymphocyte: 29.1 %
WBC: 10.5 10*3/uL (ref 3.8–10.8)

## 2020-11-17 LAB — HEPATITIS B CORE ANTIBODY, IGM: Hep B C IgM: NONREACTIVE

## 2020-11-17 NOTE — Progress Notes (Signed)
Glucose is elevated-179. Rest of CMP WNL. RBC count and hematocrit are borderline elevated.   IgA is slightly elevated.  IgG an IgM are WNL.

## 2020-11-17 NOTE — Progress Notes (Signed)
Hepatitis B negative.    Continue on cosentyx as prescribed. She will continue to require lab work every 3 months.

## 2020-12-09 ENCOUNTER — Other Ambulatory Visit (HOSPITAL_COMMUNITY): Payer: Self-pay

## 2020-12-16 ENCOUNTER — Other Ambulatory Visit (HOSPITAL_COMMUNITY): Payer: Self-pay

## 2020-12-19 ENCOUNTER — Other Ambulatory Visit (HOSPITAL_COMMUNITY): Payer: Self-pay

## 2020-12-21 ENCOUNTER — Other Ambulatory Visit (HOSPITAL_COMMUNITY): Payer: Self-pay

## 2020-12-26 ENCOUNTER — Other Ambulatory Visit (HOSPITAL_COMMUNITY): Payer: Self-pay

## 2020-12-26 ENCOUNTER — Encounter: Payer: Self-pay | Admitting: Rheumatology

## 2020-12-27 ENCOUNTER — Telehealth: Payer: Self-pay | Admitting: Pharmacist

## 2020-12-27 ENCOUNTER — Other Ambulatory Visit (HOSPITAL_COMMUNITY): Payer: Self-pay

## 2020-12-27 NOTE — Telephone Encounter (Signed)
Received letter from [facility] stating that patient's Cosentyx is now non-formulary.  Patient is getting temporary 30-day supply of Cosentyx. Pharmacy team will renew authorization  ID: [id number] BIN: [id number] PCN: [id number] Group: [id number]  Chesley Mires, PharmD, MPH, BCPS Clinical Pharmacist (Rheumatology and Pulmonology)

## 2020-12-28 ENCOUNTER — Telehealth: Payer: Self-pay

## 2020-12-28 NOTE — Telephone Encounter (Signed)
Ethelene Browns from [employer] left a voicemail regarding patient's Cosentyx PA request.   It looks like we received a response from the plan indicating patient's eligibility could not be verified / patient not found.  Please call back with the BIN, PCN, and Group name and insurance type.  There is currently an approval on file for the same plan with different quantity, the only difference with the two requests is that the member number is different.  Phone #[phone]   Reference [id number]

## 2020-12-28 NOTE — Telephone Encounter (Signed)
Submitted a Prior Authorization request to [facility] for COSENTYX via CoverMyMeds. Will update once we receive a response.  Key: [id number]  Chesley Mires, PharmD, MPH, BCPS Clinical Pharmacist (Rheumatology and Pulmonology)

## 2020-12-29 ENCOUNTER — Other Ambulatory Visit (HOSPITAL_COMMUNITY): Payer: Self-pay

## 2020-12-29 NOTE — Telephone Encounter (Signed)
Received notification from [facility] regarding a prior authorization for COSENTYX. Authorization has been APPROVED from 12/29/20 to 02/18/22.   Per test claim, copay for 28 days supply is $0  Patient can continue to fill through [facility]: [phone]   Authorization # [id number] Phone # [phone]  Notified patient via MyChart  Chesley Mires, PharmD, MPH, BCPS Clinical Pharmacist (Rheumatology and Pulmonology)

## 2020-12-29 NOTE — Telephone Encounter (Signed)
New PA submitted for patient's Cosentyx. F/u will occur in prior encounter

## 2020-12-29 NOTE — Telephone Encounter (Signed)
[employer] has been updated to reflect current PA info. Nothing further needed at this time.

## 2021-01-11 ENCOUNTER — Other Ambulatory Visit: Payer: Self-pay | Admitting: Physician Assistant

## 2021-01-11 ENCOUNTER — Other Ambulatory Visit (HOSPITAL_COMMUNITY): Payer: Self-pay

## 2021-01-11 DIAGNOSIS — L4050 Arthropathic psoriasis, unspecified: Secondary | ICD-10-CM

## 2021-01-11 MED ORDER — COSENTYX (300 MG DOSE) 150 MG/ML ~~LOC~~ SOSY
300.0000 mg | PREFILLED_SYRINGE | SUBCUTANEOUS | 0 refills | Status: DC
  Filled 2021-01-11: qty 2, 28d supply, fill #0
  Filled 2021-03-01: qty 2, 28d supply, fill #1
  Filled 2021-04-06: qty 2, 28d supply, fill #2

## 2021-01-11 NOTE — Telephone Encounter (Signed)
Next Visit: 04/18/2021  Last Visit: 11/16/2020  Last Fill: 11/22/2020 (2 month supply)  DX: Psoriatic arthropathy   Current Dose per office note 11/16/2020: Cosentyx 300 mg sq injections every 4 weeks  Labs: 11/16/2020 Glucose is elevated-179. Rest of CMP WNL. RBC count and hematocrit are borderline elevated  TB Gold: 09/20/2020 Neg    Okay to refill Cosentyx?

## 2021-01-16 ENCOUNTER — Other Ambulatory Visit (HOSPITAL_COMMUNITY): Payer: Self-pay

## 2021-01-17 ENCOUNTER — Other Ambulatory Visit (HOSPITAL_COMMUNITY): Payer: Self-pay

## 2021-02-09 ENCOUNTER — Other Ambulatory Visit (HOSPITAL_COMMUNITY): Payer: Self-pay

## 2021-02-20 ENCOUNTER — Other Ambulatory Visit (HOSPITAL_COMMUNITY): Payer: Self-pay

## 2021-03-01 ENCOUNTER — Other Ambulatory Visit (HOSPITAL_COMMUNITY): Payer: Self-pay

## 2021-03-09 ENCOUNTER — Other Ambulatory Visit (HOSPITAL_COMMUNITY): Payer: Self-pay

## 2021-03-30 ENCOUNTER — Other Ambulatory Visit (HOSPITAL_COMMUNITY): Payer: Self-pay

## 2021-04-06 ENCOUNTER — Other Ambulatory Visit (HOSPITAL_COMMUNITY): Payer: Self-pay

## 2021-04-06 NOTE — Progress Notes (Signed)
Office Visit Note  Patient: [name]             Date of Birth: 01-15-1951           MRN: [id number]             PCP: Ardith Dark, PA-C Referring: Ardith Dark, PA-C Visit Date: 04/19/2021 Occupation: @GUAROCC @  Subjective:  Pain in both hands   History of Present Illness: [name] is a 71 y.o. female with history of psoriatic arthritis, osteoarthritis, and fibromyalgia.  Patient is on Cosentyx 300 mg sq injections every 4 weeks.  She has not missed any doses of Cosentyx recently.  She continues to tolerate Cosentyx without any side effects and has not missed any doses recently.  Her most recent dose was administered on 04/15/2021.  She has been experiencing increased pain and stiffness in both hands over the past couple of months.  She denies any joint swelling.  Her discomfort is exacerbated by cooler weather temperatures.  She has been going to physical therapy to work on her lower extremity muscle strengthening.  She continues to have chronic pain in her right knee and has been wearing a brace if she plans on being more mobile.  She has been experiencing increased phantom pain in her left lower extremity (above knee amputation).  She continues to follow-up with pain management every 3 months.  She has an upcoming appointment on 05/01/2021.  She has ongoing myalgias and muscle tenderness due to fibromyalgia.  She experiences fatigue on a daily basis.      Activities of Daily Living:  Patient reports morning stiffness for several hours.   Patient Reports nocturnal pain.  Difficulty dressing/grooming: Reports Difficulty climbing stairs: Reports Difficulty getting out of chair: Reports Difficulty using hands for taps, buttons, cutlery, and/or writing: Reports  Review of Systems  Constitutional:  Negative for fatigue.  HENT:  Negative for mouth sores, mouth dryness and nose dryness.   Eyes:  Positive for dryness. Negative for pain and itching.  Respiratory:   Negative for shortness of breath and difficulty breathing.   Cardiovascular:  Negative for chest pain and palpitations.  Gastrointestinal:  Positive for constipation. Negative for blood in stool and diarrhea.  Endocrine: Negative for increased urination.  Genitourinary:  Negative for difficulty urinating.  Musculoskeletal:  Positive for joint pain, joint pain, joint swelling, myalgias, morning stiffness, muscle tenderness and myalgias.  Skin:  Positive for rash. Negative for color change.  Allergic/Immunologic: Positive for susceptible to infections.  Neurological:  Positive for numbness. Negative for dizziness, headaches, memory loss and weakness.  Hematological:  Positive for bruising/bleeding tendency.  Psychiatric/Behavioral:  Negative for confusion.    PMFS History:  Patient Active Problem List   Diagnosis Date Noted   Dyslipidemia 10/16/2016   History of total knee replacement, bilateral 10/16/2016   Primary osteoarthritis of both hands 10/16/2016   History of diabetes mellitus 10/16/2016   Other chronic pain 10/16/2016   History of glaucoma 10/16/2016   Psoriatic arthropathy ([place]) 07/06/2016   Other psoriasis 07/06/2016   High risk medications (not anticoagulants) long-term use 07/06/2016   Fibromyalgia 07/06/2016   Chronic fatigue 07/06/2016   Primary insomnia 07/06/2016    Past Medical History:  Diagnosis Date   Arthritis    Cataract    Diabetes mellitus without complication ([place])    Glaucoma    Hypertension    Vertigo     Family History  Problem Relation Age of Onset   Cancer Sister  Diabetes Sister    COPD Brother    Diabetes Brother    Past Surgical History:  Procedure Laterality Date   ABDOMINAL HYSTERECTOMY     APPENDECTOMY     CARPAL TUNNEL RELEASE     CHOLECYSTECTOMY     FEMUR FRACTURE SURGERY Right    FIBULA FRACTURE SURGERY Left 09/01/2018   LEG AMPUTATION Left 03/25/2020   REPLACEMENT TOTAL KNEE BILATERAL     TIBIA FRACTURE SURGERY Left  09/01/2018   TOTAL SHOULDER ARTHROPLASTY     TRIGGER FINGER RELEASE     TRIGGER FINGER RELEASE Right 02/2017   3rd and 4th digits.   Social History   Social History Narrative   Not on file   Immunization History  Administered Date(s) Administered   Engineer, maintenance (J&J) SARS-COV-2 Vaccination 02/22/2020     Objective: Vital Signs: BP 102/63 (BP Location: Left Arm, Patient Position: Sitting, Cuff Size: Large)    Pulse 67    Ht 5' 6.5" (1.689 m)    Wt 229 lb (103.9 kg) Comment: per patient   BMI 36.41 kg/m    Physical Exam Vitals and nursing note reviewed.  Constitutional:      Appearance: She is well-developed.  HENT:     Head: Normocephalic and atraumatic.  Eyes:     Conjunctiva/sclera: Conjunctivae normal.  Pulmonary:     Effort: Pulmonary effort is normal.  Abdominal:     Palpations: Abdomen is soft.  Musculoskeletal:     Cervical back: Normal range of motion.  Skin:    General: Skin is warm and dry.     Capillary Refill: Capillary refill takes less than 2 seconds.  Neurological:     Mental Status: She is alert and oriented to person, place, and time.  Psychiatric:        Behavior: Behavior normal.     Musculoskeletal Exam: Patient remained in wheelchair during the examination.  C-spine has good range of motion with no discomfort.  She has trapezius muscle tension and tenderness bilaterally.  Postural thoracic kyphosis noted.  Some midline spinal tenderness in the thoracic region.  Shoulder joints, elbow joints, and wrist joints have good range of motion.  Contracture of right hand.  PIP and DIP thickening consistent with osteoarthritis of both hands.  No tenderness or synovitis over MCP joints.  Right knee replacement has good range of motion.  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 CDAI Exam: CDAI Score: -- Patient Global: --; Provider Global: -- Swollen: --; Tender: -- Joint Exam 04/19/2021   No joint exam has been documented for this visit   There is currently no  information documented on the homunculus. Go to the Rheumatology activity and complete the homunculus joint exam.  Investigation: No additional findings.  Imaging: No results found.  Recent Labs: Lab Results  Component Value Date   WBC 10.5 11/16/2020   HGB 14.8 11/16/2020   PLT 268 11/16/2020   NA 140 11/16/2020   K 4.9 11/16/2020   CL 100 11/16/2020   CO2 31 11/16/2020   GLUCOSE 179 (H) 11/16/2020   BUN 17 11/16/2020   CREATININE 1.00 11/16/2020   BILITOT 0.5 11/16/2020   ALKPHOS 70 07/18/2016   AST 10 11/16/2020   ALT 16 11/16/2020   PROT 7.1 11/16/2020   ALBUMIN 3.8 07/18/2016   CALCIUM 10.1 11/16/2020   GFRAA 73 08/17/2020   QFTBGOLDPLUS NEGATIVE 09/20/2020    Speciality Comments: [place] started 08/24/20  Procedures:  No procedures performed Allergies: Apremilast, Aspirin, Bee venom, Cephalosporins, Clarithromycin,  Codeine, Demerol, E-mycin [erythromycin base], Etodolac, Gatifloxacin, Iodinated contrast media, Levaquin  [levofloxacin in d5w], Levofloxacin, Levofloxacin, Lyrica [pregabalin], Meperidine, Morphine and related, Nsaids, Other, Phenothiazines, Shellfish-derived products, Tequin, Penicillins, and Sulfa drugs cross reactors   Assessment / Plan:     Visit Diagnoses: Psoriatic arthropathy ([place]): She has no synovitis or dactylitis on examination.  Overall her psoriatic arthritis is well controlled on Cosentyx 300 mg subcu injections every 4 weeks.  Her last dose of Cosentyx was on 04/05/2021.  She continues to tolerate Cosentyx without any side effects or injection site reactions.  She has occasional scattered patches of psoriasis especially leading up to her next injection.  A refill of clobetasol cream was sent to the pharmacy today.  She has been experiencing some increased pain and stiffness in both hands due to underlying osteoarthritis.  She has some tenderness over the right CMC joint on exam but no inflammation was noted.  Discussed the importance of joint  protection and muscle strengthening.  She will remain on Cosentyx as prescribed.  She was advised to notify us if she develops increased joint inflammation.  She will follow-up in the office in 5 months.  Other psoriasis: Improved since restarting on Cosentyx in August 2022.  She experiences occasional patches of psoriasis on the extensor surface of her right elbow and on her scalp.  She requested a refill of clobetasol to be sent to the pharmacy.  She will remain on Cosentyx as prescribed.  High risk medication use - Cosentyx 300 mg sq injections every 4 weeks.  Initially started on 09/27/15-d/c in June 2021. Restarted on cosentyx on 09/27/20. CBC and CMP updated on 03/01/2021.  Her next lab work will be due in April and every 3 months to monitor for toxicity.  TB Gold negative on 09/20/2020. Discussed the importance of holding Cosentyx if she develops signs or symptoms of an infection and to resume once the infection is completely cleared.  Primary osteoarthritis of both hands: She has PIP and DIP thickening consistent with osteoarthritis of both hands.  She is been experiencing increased pain and stiffness in both hands over the past couple of months with cooler weather temperatures.  She has not noticed any joint swelling.  No synovitis or dactylitis was noted on examination today.  Discussed the importance of joint protection and muscle strengthening.  History of total right knee replacement: Chronic pain.  She has been wearing a knee brace as needed.  She is currently going to physical therapy to work on lower extremity muscle strengthening.  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  Patient remains primarily in a wheelchair but has been going to physical therapy to try to improve her lower extremity strength and mobility.  Age-related osteoporosis without current pathological fracture - No record of DEXA in  [facility].  She is currently being prescribed prolia by her PCP.    Fibromyalgia: She has generalized hyperalgesia and positive tender points on examination.  She continues to experience intermittent myalgias and muscle tenderness due to fibromyalgia.  She has trapezius muscle tension and tenderness bilaterally.  She continues to suffer from chronic fatigue and insomnia.  She has been trying to increase her activity level as tolerated.  She continues to follow-up with pain management every 3 months.  Chronic fatigue: Persistent.  She has been trying to increase her activity level.  Primary insomnia: Chronic  Contracture of hand joint, right -  History of right 4th trigger finger release performed by Dr. Grandville Silos.    Other medical conditions are listed as follows:  History of fracture of tibia - Dr. Durene Romans  History of fibula fracture - Dr. Durene Romans  Dyslipidemia  History of diabetes mellitus  Orders: No orders of the defined types were placed in this encounter.  Meds ordered this encounter  Medications   clobetasol cream (TEMOVATE) 0.05 %    Sig: Apply 1 application topically 2 (two) times daily.    Dispense:  45 g    Refill:  1    Follow-Up Instructions: Return in about 5 months (around 09/19/2021) for Psoriatic arthritis, Osteoarthritis, Fibromyalgia.   Ofilia Neas, PA-C  Note - This record has been created using Dragon software.  Chart creation errors have been sought, but may not always  have been located. Such creation errors do not reflect on  the standard of medical care.

## 2021-04-18 ENCOUNTER — Ambulatory Visit: Payer: PRIVATE HEALTH INSURANCE | Admitting: Rheumatology

## 2021-04-19 ENCOUNTER — Other Ambulatory Visit: Payer: Self-pay

## 2021-04-19 ENCOUNTER — Encounter: Payer: Self-pay | Admitting: Physician Assistant

## 2021-04-19 ENCOUNTER — Ambulatory Visit (INDEPENDENT_AMBULATORY_CARE_PROVIDER_SITE_OTHER): Payer: Medicare Other | Admitting: Physician Assistant

## 2021-04-19 VITALS — BP 102/63 | HR 67 | Ht 66.5 in | Wt 229.0 lb

## 2021-04-19 DIAGNOSIS — Z96651 Presence of right artificial knee joint: Secondary | ICD-10-CM

## 2021-04-19 DIAGNOSIS — Z8781 Personal history of (healed) traumatic fracture: Secondary | ICD-10-CM

## 2021-04-19 DIAGNOSIS — Z79899 Other long term (current) drug therapy: Secondary | ICD-10-CM

## 2021-04-19 DIAGNOSIS — L408 Other psoriasis: Secondary | ICD-10-CM | POA: Diagnosis not present

## 2021-04-19 DIAGNOSIS — M19041 Primary osteoarthritis, right hand: Secondary | ICD-10-CM

## 2021-04-19 DIAGNOSIS — R5382 Chronic fatigue, unspecified: Secondary | ICD-10-CM

## 2021-04-19 DIAGNOSIS — M797 Fibromyalgia: Secondary | ICD-10-CM

## 2021-04-19 DIAGNOSIS — L4050 Arthropathic psoriasis, unspecified: Secondary | ICD-10-CM

## 2021-04-19 DIAGNOSIS — M19042 Primary osteoarthritis, left hand: Secondary | ICD-10-CM

## 2021-04-19 DIAGNOSIS — Z8639 Personal history of other endocrine, nutritional and metabolic disease: Secondary | ICD-10-CM

## 2021-04-19 DIAGNOSIS — S78112A Complete traumatic amputation at level between left hip and knee, initial encounter: Secondary | ICD-10-CM

## 2021-04-19 DIAGNOSIS — M810 Age-related osteoporosis without current pathological fracture: Secondary | ICD-10-CM

## 2021-04-19 DIAGNOSIS — F5101 Primary insomnia: Secondary | ICD-10-CM

## 2021-04-19 DIAGNOSIS — E785 Hyperlipidemia, unspecified: Secondary | ICD-10-CM

## 2021-04-19 DIAGNOSIS — M24541 Contracture, right hand: Secondary | ICD-10-CM

## 2021-04-19 MED ORDER — CLOBETASOL PROPIONATE 0.05 % EX CREA: 1.0000 "application " | TOPICAL_CREAM | Freq: Two times a day (BID) | CUTANEOUS | 1 refills | Status: AC

## 2021-04-19 NOTE — Patient Instructions (Addendum)
Standing Labs ?We placed an order today for your standing lab work.  ? ?Please have your standing labs drawn in mid-April and every 3 months  ? ?If possible, please have your labs drawn 2 weeks prior to your appointment so that the provider can discuss your results at your appointment. ? ?Please note that you may see your imaging and lab results in MyChart before we have reviewed them. ?We may be awaiting multiple results to interpret others before contacting you. ?Please allow our office up to 72 hours to thoroughly review all of the results before contacting the office for clarification of your results. ? ?We have open lab daily: ?Monday through Thursday from 1:30-4:30 PM and Friday from 1:30-4:00 PM ?at the office of Dr. Pollyann Savoy, [facility].   ?Please be advised, all patients with office appointments requiring lab work will take precedent over walk-in lab work.  ?If possible, please come for your lab work on Monday and Friday afternoons, as you may experience shorter wait times. ?The office is located at [address], [address], [place], [place] [address] ?No appointment is necessary.   ?Labs are drawn by [facility]. Please bring your co-pay at the time of your lab draw.  You may receive a bill from [facility] for your lab work. ? ?Please note if you are on Hydroxychloroquine and and an order has been placed for a Hydroxychloroquine level, you will need to have it drawn 4 hours or more after your last dose. ? ?If you wish to have your labs drawn at another location, please call the office 24 hours in advance to send orders. ? ?If you have any questions regarding directions or hours of operation,  ?please call [phone].   ?As a reminder, please drink plenty of water prior to coming for your lab work. Thanks! ? ?

## 2021-05-01 ENCOUNTER — Other Ambulatory Visit: Payer: Self-pay | Admitting: Physician Assistant

## 2021-05-01 ENCOUNTER — Other Ambulatory Visit (HOSPITAL_COMMUNITY): Payer: Self-pay

## 2021-05-01 DIAGNOSIS — L4050 Arthropathic psoriasis, unspecified: Secondary | ICD-10-CM

## 2021-05-01 MED ORDER — COSENTYX SENSOREADY (300 MG) 150 MG/ML ~~LOC~~ SOAJ
300.0000 mg | SUBCUTANEOUS | 0 refills | Status: DC
  Filled 2021-05-08: qty 2, 28d supply, fill #0
  Filled 2021-05-30: qty 2, 28d supply, fill #1
  Filled 2021-07-03: qty 2, 28d supply, fill #2

## 2021-05-01 NOTE — Telephone Encounter (Signed)
Next Visit: 09/20/2021 ? ?Last Visit: 04/19/2021 ? ?Last Fill: 01/11/2021 ? ?DX: Psoriatic arthropathy ? ?Current Dose per office note on 04/19/2021: Cosentyx 300 mg sq injections every 4 weeks. ? ?Labs: 03/01/2021 MCH 27.3, MCHC 32.1, potassium 5.4, glucose 307, GFR 58 ? ?TB Gold: 09/20/2020 negative   ? ?Okay to refill cosentyx?  ?

## 2021-05-02 ENCOUNTER — Other Ambulatory Visit (HOSPITAL_COMMUNITY): Payer: Self-pay

## 2021-05-08 ENCOUNTER — Other Ambulatory Visit (HOSPITAL_COMMUNITY): Payer: Self-pay

## 2021-05-12 ENCOUNTER — Other Ambulatory Visit (HOSPITAL_COMMUNITY): Payer: Self-pay

## 2021-05-30 ENCOUNTER — Other Ambulatory Visit (HOSPITAL_COMMUNITY): Payer: Self-pay

## 2021-06-07 ENCOUNTER — Other Ambulatory Visit (HOSPITAL_COMMUNITY): Payer: Self-pay

## 2021-06-14 ENCOUNTER — Other Ambulatory Visit: Payer: Self-pay | Admitting: *Deleted

## 2021-06-14 DIAGNOSIS — Z79899 Other long term (current) drug therapy: Secondary | ICD-10-CM

## 2021-06-15 LAB — CBC WITH DIFFERENTIAL/PLATELET
Absolute Monocytes: 553 cells/uL (ref 200–950)
Basophils Absolute: 34 cells/uL (ref 0–200)
Basophils Relative: 0.4 %
Eosinophils Absolute: 230 cells/uL (ref 15–500)
Eosinophils Relative: 2.7 %
HCT: 41 % (ref 35.0–45.0)
Hemoglobin: 13.1 g/dL (ref 11.7–15.5)
Lymphs Abs: 3060 cells/uL (ref 850–3900)
MCH: 28.7 pg (ref 27.0–33.0)
MCHC: 32 g/dL (ref 32.0–36.0)
MCV: 89.7 fL (ref 80.0–100.0)
MPV: 10.7 fL (ref 7.5–12.5)
Monocytes Relative: 6.5 %
Neutro Abs: 4624 cells/uL (ref 1500–7800)
Neutrophils Relative %: 54.4 %
Platelets: 243 10*3/uL (ref 140–400)
RBC: 4.57 10*6/uL (ref 3.80–5.10)
RDW: 12.2 % (ref 11.0–15.0)
Total Lymphocyte: 36 %
WBC: 8.5 10*3/uL (ref 3.8–10.8)

## 2021-06-15 LAB — COMPLETE METABOLIC PANEL WITH GFR
AG Ratio: 1.3 (calc) (ref 1.0–2.5)
ALT: 86 U/L — ABNORMAL HIGH (ref 6–29)
AST: 19 U/L (ref 10–35)
Albumin: 3.7 g/dL (ref 3.6–5.1)
Alkaline phosphatase (APISO): 103 U/L (ref 37–153)
BUN/Creatinine Ratio: 32 (calc) — ABNORMAL HIGH (ref 6–22)
BUN: 36 mg/dL — ABNORMAL HIGH (ref 7–25)
CO2: 31 mmol/L (ref 20–32)
Calcium: 9.7 mg/dL (ref 8.6–10.4)
Chloride: 102 mmol/L (ref 98–110)
Creat: 1.11 mg/dL — ABNORMAL HIGH (ref 0.60–1.00)
Globulin: 2.8 g/dL (calc) (ref 1.9–3.7)
Glucose, Bld: 269 mg/dL — ABNORMAL HIGH (ref 65–99)
Potassium: 5.9 mmol/L — ABNORMAL HIGH (ref 3.5–5.3)
Sodium: 138 mmol/L (ref 135–146)
Total Bilirubin: 0.3 mg/dL (ref 0.2–1.2)
Total Protein: 6.5 g/dL (ref 6.1–8.1)
eGFR: 53 mL/min/{1.73_m2} — ABNORMAL LOW (ref >=60)

## 2021-06-15 NOTE — Progress Notes (Signed)
Glucose is elevated-269.  ?Creatinine is elevated-1.11 and GFR is low-53.  Potassium is elevated-5.9.   ?ALT is elevated-86.  Please clarify if she has had any recent medication changes?  ?Please forward lab work to PCP. Potassium will need to be rechecked.  ? ?Please advise the patient to avoid the use of NSAIDs and tylenol.   ? ?CBC WNL.

## 2021-07-03 ENCOUNTER — Other Ambulatory Visit (HOSPITAL_COMMUNITY): Payer: Self-pay

## 2021-07-07 ENCOUNTER — Other Ambulatory Visit (HOSPITAL_COMMUNITY): Payer: Self-pay

## 2021-08-01 ENCOUNTER — Other Ambulatory Visit: Payer: Self-pay | Admitting: Physician Assistant

## 2021-08-01 ENCOUNTER — Other Ambulatory Visit (HOSPITAL_COMMUNITY): Payer: Self-pay

## 2021-08-01 DIAGNOSIS — L4050 Arthropathic psoriasis, unspecified: Secondary | ICD-10-CM

## 2021-08-01 MED ORDER — COSENTYX SENSOREADY (300 MG) 150 MG/ML ~~LOC~~ SOAJ
300.0000 mg | SUBCUTANEOUS | 0 refills | Status: DC
  Filled 2021-08-02: qty 2, 28d supply, fill #0
  Filled 2021-08-28: qty 2, 28d supply, fill #1

## 2021-08-01 NOTE — Telephone Encounter (Signed)
Next Visit: 09/20/2021   Last Visit: 04/19/2021   Last Fill: 05/01/2021    DX: Psoriatic arthropathy   Current Dose per office note on 04/19/2021: Cosentyx 300 mg sq injections every 4 weeks.   Labs: 06/14/2021 Glucose is elevated-269.  Creatinine is elevated-1.11 and GFR is low-53.  Potassium is elevated-5.9.   ALT is elevated-86.  CBC WNL.    TB Gold: 09/20/2020 negative     Okay to refill cosentyx?

## 2021-08-02 ENCOUNTER — Other Ambulatory Visit (HOSPITAL_COMMUNITY): Payer: Self-pay

## 2021-08-08 ENCOUNTER — Other Ambulatory Visit (HOSPITAL_COMMUNITY): Payer: Self-pay

## 2021-08-28 ENCOUNTER — Other Ambulatory Visit (HOSPITAL_COMMUNITY): Payer: Self-pay

## 2021-09-05 ENCOUNTER — Other Ambulatory Visit (HOSPITAL_COMMUNITY): Payer: Self-pay

## 2021-09-06 NOTE — Progress Notes (Deleted)
Office Visit Note  Patient: [name]             Date of Birth: 1950-04-28           MRN: [id number]             PCP: Jamal Collin, PA-C Referring: Jamal Collin, PA-C Visit Date: 09/20/2021 Occupation: @GUAROCC @  Subjective:  No chief complaint on file.   History of Present Illness: [name] is a 71 y.o. female ***   Activities of Daily Living:  Patient reports morning stiffness for *** {minute/hour:19697}.   Patient {ACTIONS;DENIES/REPORTS:21021675::"Denies"} nocturnal pain.  Difficulty dressing/grooming: {ACTIONS;DENIES/REPORTS:21021675::"Denies"} Difficulty climbing stairs: {ACTIONS;DENIES/REPORTS:21021675::"Denies"} Difficulty getting out of chair: {ACTIONS;DENIES/REPORTS:21021675::"Denies"} Difficulty using hands for taps, buttons, cutlery, and/or writing: {ACTIONS;DENIES/REPORTS:21021675::"Denies"}  No Rheumatology ROS completed.   PMFS History:  Patient Active Problem List   Diagnosis Date Noted  . Dyslipidemia 10/16/2016  . History of total knee replacement, bilateral 10/16/2016  . Primary osteoarthritis of both hands 10/16/2016  . History of diabetes mellitus 10/16/2016  . Other chronic pain 10/16/2016  . History of glaucoma 10/16/2016  . Psoriatic arthropathy (HCC) 07/06/2016  . Other psoriasis 07/06/2016  . High risk medications (not anticoagulants) long-term use 07/06/2016  . Fibromyalgia 07/06/2016  . Chronic fatigue 07/06/2016  . Primary insomnia 07/06/2016    Past Medical History:  Diagnosis Date  . Arthritis   . Cataract   . Diabetes mellitus without complication (HCC)   . Glaucoma   . Hypertension   . Vertigo     Family History  Problem Relation Age of Onset  . Cancer Sister   . Diabetes Sister   . COPD Brother   . Diabetes Brother    Past Surgical History:  Procedure Laterality Date  . ABDOMINAL HYSTERECTOMY    . APPENDECTOMY    . CARPAL TUNNEL RELEASE    . CHOLECYSTECTOMY    . FEMUR FRACTURE SURGERY Right   .  FIBULA FRACTURE SURGERY Left 09/01/2018  . LEG AMPUTATION Left 03/25/2020  . REPLACEMENT TOTAL KNEE BILATERAL    . TIBIA FRACTURE SURGERY Left 09/01/2018  . TOTAL SHOULDER ARTHROPLASTY    . TRIGGER FINGER RELEASE    . TRIGGER FINGER RELEASE Right 02/2017   3rd and 4th digits.   Social History   Social History Narrative  . Not on file   Immunization History  Administered Date(s) Administered  . Janssen (J&J) SARS-COV-2 Vaccination 02/22/2020     Objective: Vital Signs: There were no vitals taken for this visit.   Physical Exam   Musculoskeletal Exam: ***  CDAI Exam: CDAI Score: -- Patient Global: --; Provider Global: -- Swollen: --; Tender: -- Joint Exam 09/20/2021   No joint exam has been documented for this visit   There is currently no information documented on the homunculus. Go to the Rheumatology activity and complete the homunculus joint exam.  Investigation: No additional findings.  Imaging: No results found.  Recent Labs: Lab Results  Component Value Date   WBC 8.5 06/14/2021   HGB 13.1 06/14/2021   PLT 243 06/14/2021   NA 138 06/14/2021   K 5.9 (H) 06/14/2021   CL 102 06/14/2021   CO2 31 06/14/2021   GLUCOSE 269 (H) 06/14/2021   BUN 36 (H) 06/14/2021   CREATININE 1.11 (H) 06/14/2021   BILITOT 0.3 06/14/2021   ALKPHOS 70 07/18/2016   AST 19 06/14/2021   ALT 86 (H) 06/14/2021   PROT 6.5 06/14/2021   ALBUMIN 3.8 07/18/2016   CALCIUM 9.7 06/14/2021  GFRAA 73 08/17/2020   QFTBGOLDPLUS NEGATIVE 09/20/2020    Speciality Comments: [place] started 08/24/20  Procedures:  No procedures performed Allergies: Apremilast, Aspirin, Bee venom, Cephalosporins, Clarithromycin, Codeine, Demerol, E-mycin [erythromycin base], Etodolac, Gatifloxacin, Iodinated contrast media, Levaquin  [levofloxacin in d5w], Levofloxacin, Levofloxacin, Lyrica [pregabalin], Meperidine, Morphine and related, Nsaids, Other, Phenothiazines, Shellfish-derived products, Tequin,  Penicillins, and Sulfa drugs cross reactors   Assessment / Plan:     Visit Diagnoses: No diagnosis found.  Orders: No orders of the defined types were placed in this encounter.  No orders of the defined types were placed in this encounter.   Face-to-face time spent with patient was *** minutes. Greater than 50% of time was spent in counseling and coordination of care.  Follow-Up Instructions: No follow-ups on file.   Ellen Henri, CMA  Note - This record has been created using Animal nutritionist.  Chart creation errors have been sought, but may not always  have been located. Such creation errors do not reflect on  the standard of medical care.

## 2021-09-18 NOTE — Progress Notes (Deleted)
Office Visit Note  Patient: [name]             Date of Birth: [birth year]           MRN: [id number]             PCP: Jamal Collin, PA-C Referring: Jamal Collin, PA-C Visit Date: 10/02/2021 Occupation: @GUAROCC @  Subjective:  No chief complaint on file.   History of Present Illness: [name] is a 71 y.o. female ***   Activities of Daily Living:  Patient reports morning stiffness for *** {minute/hour:19697}.   Patient {ACTIONS;DENIES/REPORTS:21021675::"Denies"} nocturnal pain.  Difficulty dressing/grooming: {ACTIONS;DENIES/REPORTS:21021675::"Denies"} Difficulty climbing stairs: {ACTIONS;DENIES/REPORTS:21021675::"Denies"} Difficulty getting out of chair: {ACTIONS;DENIES/REPORTS:21021675::"Denies"} Difficulty using hands for taps, buttons, cutlery, and/or writing: {ACTIONS;DENIES/REPORTS:21021675::"Denies"}  No Rheumatology ROS completed.   PMFS History:  Patient Active Problem List   Diagnosis Date Noted  . Dyslipidemia 10/16/2016  . History of total knee replacement, bilateral 10/16/2016  . Primary osteoarthritis of both hands 10/16/2016  . History of diabetes mellitus 10/16/2016  . Other chronic pain 10/16/2016  . History of glaucoma 10/16/2016  . Psoriatic arthropathy (HCC) 07/06/2016  . Other psoriasis 07/06/2016  . High risk medications (not anticoagulants) long-term use 07/06/2016  . Fibromyalgia 07/06/2016  . Chronic fatigue 07/06/2016  . Primary insomnia 07/06/2016    Past Medical History:  Diagnosis Date  . Arthritis   . Cataract   . Diabetes mellitus without complication (HCC)   . Glaucoma   . Hypertension   . Vertigo     Family History  Problem Relation Age of Onset  . Cancer Sister   . Diabetes Sister   . COPD Brother   . Diabetes Brother    Past Surgical History:  Procedure Laterality Date  . ABDOMINAL HYSTERECTOMY    . APPENDECTOMY    . CARPAL TUNNEL RELEASE    . CHOLECYSTECTOMY    . FEMUR FRACTURE SURGERY Right   .  FIBULA FRACTURE SURGERY Left 09/01/2018  . LEG AMPUTATION Left 03/25/2020  . REPLACEMENT TOTAL KNEE BILATERAL    . TIBIA FRACTURE SURGERY Left 09/01/2018  . TOTAL SHOULDER ARTHROPLASTY    . TRIGGER FINGER RELEASE    . TRIGGER FINGER RELEASE Right 02/2017   3rd and 4th digits.   Social History   Social History Narrative  . Not on file   Immunization History  Administered Date(s) Administered  . Janssen (J&J) SARS-COV-2 Vaccination 02/22/2020     Objective: Vital Signs: There were no vitals taken for this visit.   Physical Exam   Musculoskeletal Exam: ***  CDAI Exam: CDAI Score: -- Patient Global: --; Provider Global: -- Swollen: --; Tender: -- Joint Exam 10/02/2021   No joint exam has been documented for this visit   There is currently no information documented on the homunculus. Go to the Rheumatology activity and complete the homunculus joint exam.  Investigation: No additional findings.  Imaging: No results found.  Recent Labs: Lab Results  Component Value Date   WBC 8.5 06/14/2021   HGB 13.1 06/14/2021   PLT 243 06/14/2021   NA 138 06/14/2021   K 5.9 (H) 06/14/2021   CL 102 06/14/2021   CO2 31 06/14/2021   GLUCOSE 269 (H) 06/14/2021   BUN 36 (H) 06/14/2021   CREATININE 1.11 (H) 06/14/2021   BILITOT 0.3 06/14/2021   ALKPHOS 70 07/18/2016   AST 19 06/14/2021   ALT 86 (H) 06/14/2021   PROT 6.5 06/14/2021   ALBUMIN 3.8 07/18/2016   CALCIUM 9.7 06/14/2021  GFRAA 73 08/17/2020   QFTBGOLDPLUS NEGATIVE 09/20/2020    Speciality Comments: [place] started 08/24/20  Procedures:  No procedures performed Allergies: Apremilast, Aspirin, Bee venom, Cephalosporins, Clarithromycin, Codeine, Demerol, E-mycin [erythromycin base], Etodolac, Gatifloxacin, Iodinated contrast media, Levaquin  [levofloxacin in d5w], Levofloxacin, Levofloxacin, Lyrica [pregabalin], Meperidine, Morphine and related, Nsaids, Other, Phenothiazines, Shellfish-derived products, Tequin,  Penicillins, and Sulfa drugs cross reactors   Assessment / Plan:     Visit Diagnoses: No diagnosis found.  Orders: No orders of the defined types were placed in this encounter.  No orders of the defined types were placed in this encounter.   Face-to-face time spent with patient was *** minutes. Greater than 50% of time was spent in counseling and coordination of care.  Follow-Up Instructions: No follow-ups on file.   Ellen Henri, CMA  Note - This record has been created using Animal nutritionist.  Chart creation errors have been sought, but may not always  have been located. Such creation errors do not reflect on  the standard of medical care.

## 2021-09-20 ENCOUNTER — Other Ambulatory Visit (HOSPITAL_COMMUNITY): Payer: Self-pay

## 2021-09-20 ENCOUNTER — Other Ambulatory Visit: Payer: Self-pay | Admitting: Pharmacist

## 2021-09-20 ENCOUNTER — Ambulatory Visit: Payer: PRIVATE HEALTH INSURANCE | Admitting: Rheumatology

## 2021-09-20 DIAGNOSIS — S78112A Complete traumatic amputation at level between left hip and knee, initial encounter: Secondary | ICD-10-CM

## 2021-09-20 DIAGNOSIS — M810 Age-related osteoporosis without current pathological fracture: Secondary | ICD-10-CM

## 2021-09-20 DIAGNOSIS — L4050 Arthropathic psoriasis, unspecified: Secondary | ICD-10-CM

## 2021-09-20 DIAGNOSIS — Z8639 Personal history of other endocrine, nutritional and metabolic disease: Secondary | ICD-10-CM

## 2021-09-20 DIAGNOSIS — Z8781 Personal history of (healed) traumatic fracture: Secondary | ICD-10-CM

## 2021-09-20 DIAGNOSIS — M19042 Primary osteoarthritis, left hand: Secondary | ICD-10-CM

## 2021-09-20 DIAGNOSIS — F5101 Primary insomnia: Secondary | ICD-10-CM

## 2021-09-20 DIAGNOSIS — M24541 Contracture, right hand: Secondary | ICD-10-CM

## 2021-09-20 DIAGNOSIS — R5382 Chronic fatigue, unspecified: Secondary | ICD-10-CM

## 2021-09-20 DIAGNOSIS — M797 Fibromyalgia: Secondary | ICD-10-CM

## 2021-09-20 DIAGNOSIS — L408 Other psoriasis: Secondary | ICD-10-CM

## 2021-09-20 DIAGNOSIS — Z79899 Other long term (current) drug therapy: Secondary | ICD-10-CM

## 2021-09-20 DIAGNOSIS — E785 Hyperlipidemia, unspecified: Secondary | ICD-10-CM

## 2021-09-20 DIAGNOSIS — Z96651 Presence of right artificial knee joint: Secondary | ICD-10-CM

## 2021-09-20 MED ORDER — COSENTYX SENSOREADY (300 MG) 150 MG/ML ~~LOC~~ SOAJ
300.0000 mg | SUBCUTANEOUS | 0 refills | Status: DC
  Filled 2021-09-20 – 2021-09-29 (×2): qty 2, 28d supply, fill #0

## 2021-09-20 NOTE — Telephone Encounter (Signed)
Rx for Cosentyx 300 every 28 days sent to [facility] for hospital-based cost pricing.   Total qty remaining is 2 ml  Chesley Mires, PharmD, MPH, BCPS, CPP Clinical Pharmacist (Rheumatology and Pulmonology)

## 2021-09-21 NOTE — Progress Notes (Signed)
Office Visit Note  Patient: [name]             Date of Birth: 1950/09/13           MRN: [id number]             PCP: Ardith Dark, PA-C Referring: Ardith Dark, PA-C Visit Date: 10/02/2021 Occupation: @GUAROCC @  Subjective:  Stiffness in both hands  History of Present Illness: [name] is a 71 y.o. female with history of psoriatic arthritis and osteoarthritis. She remains on Cosentyx 300 mg sq injections every 4 weeks.  Initially started on 09/27/15-d/c in June 2021. Restarted on cosentyx on 09/27/20.  She continues to tolerate Cosentyx without any side effects or injection site reactions.  She has not missed any doses of Cosentyx recently.  She states that she has been experiencing increased pain and stiffness in both hands about 1 week prior to when she is due for her Cosentyx injections.  She has also noticed patches of psoriasis about 1 week prior to her Cosentyx dose being due which typically resolve after administering her Cosentyx dose.  She denies any Achilles tendinitis or plantar fasciitis.  She denies any SI joint discomfort.  She denies any signs or symptoms of uveitis.  She has not not had any recent or recurrent infections.  She denies any new medical conditions.  She has been trying to get up on a daily basis to walk but also continues to use her motorized wheelchair.     Activities of Daily Living:  Patient reports morning stiffness for all day. Patient Reports nocturnal pain.  Difficulty dressing/grooming: Denies Difficulty climbing stairs: Reports Difficulty getting out of chair: Denies Difficulty using hands for taps, buttons, cutlery, and/or writing: Reports  Review of Systems  Constitutional:  Positive for fatigue.  HENT:  Positive for mouth dryness. Negative for mouth sores.   Eyes:  Negative for dryness.  Respiratory:  Negative for shortness of breath.   Cardiovascular:  Negative for chest pain and palpitations.  Gastrointestinal:  Negative  for blood in stool, constipation and diarrhea.  Endocrine: Negative for increased urination.  Genitourinary:  Positive for nocturia. Negative for involuntary urination.  Musculoskeletal:  Positive for joint pain, joint pain, joint swelling, myalgias, morning stiffness, muscle tenderness and myalgias. Negative for muscle weakness.  Skin:  Positive for rash. Negative for color change, hair loss and sensitivity to sunlight.  Allergic/Immunologic: Negative for susceptible to infections.  Neurological:  Positive for dizziness and headaches.  Hematological:  Positive for swollen glands.  Psychiatric/Behavioral:  Positive for depressed mood. Negative for sleep disturbance. The patient is nervous/anxious.     PMFS History:  Patient Active Problem List   Diagnosis Date Noted   Dyslipidemia 10/16/2016   History of total knee replacement, bilateral 10/16/2016   Primary osteoarthritis of both hands 10/16/2016   History of diabetes mellitus 10/16/2016   Other chronic pain 10/16/2016   History of glaucoma 10/16/2016   Psoriatic arthropathy ([place]) 07/06/2016   Other psoriasis 07/06/2016   High risk medications (not anticoagulants) long-term use 07/06/2016   Fibromyalgia 07/06/2016   Chronic fatigue 07/06/2016   Primary insomnia 07/06/2016    Past Medical History:  Diagnosis Date   Arthritis    Cataract    Diabetes mellitus without complication ([place])    Glaucoma    Hypertension    Vertigo     Family History  Problem Relation Age of Onset   Cancer Sister    Diabetes Sister  COPD Brother    Diabetes Brother    Past Surgical History:  Procedure Laterality Date   ABDOMINAL HYSTERECTOMY     APPENDECTOMY     CARPAL TUNNEL RELEASE     CHOLECYSTECTOMY     FEMUR FRACTURE SURGERY Right    FIBULA FRACTURE SURGERY Left 09/01/2018   LEG AMPUTATION Left 03/25/2020   REPLACEMENT TOTAL KNEE BILATERAL     TIBIA FRACTURE SURGERY Left 09/01/2018   TOTAL SHOULDER ARTHROPLASTY     TRIGGER FINGER  RELEASE     TRIGGER FINGER RELEASE Right 02/2017   3rd and 4th digits.   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  Constitutional:      Appearance: She is well-developed.  HENT:     Head: Normocephalic and atraumatic.  Eyes:     Conjunctiva/sclera: Conjunctivae normal.  Cardiovascular:     Rate and Rhythm: Normal rate and regular rhythm.     Heart sounds: Normal heart sounds.  Pulmonary:     Effort: Pulmonary effort is normal.     Breath sounds: Normal breath sounds.  Abdominal:     General: Bowel sounds are normal.     Palpations: Abdomen is soft.  Musculoskeletal:     Cervical back: Normal range of motion.  Skin:    General: Skin is warm and dry.     Capillary Refill: Capillary refill takes less than 2 seconds.  Neurological:     Mental Status: She is alert and oriented to person, place, and time.  Psychiatric:        Behavior: Behavior normal.      Musculoskeletal Exam: Patient remained in motorized wheelchair during exam.  C-spine has good range of motion with no discomfort.  Postural thoracic kyphosis noted.  No midline spinal tenderness noted.  Shoulder joints and elbow joints have good range of motion.  Wrist joints have good range of motion with no tenderness or synovitis.  Contracture of right hand.  PIP and DIP thickening consistent with osteoarthritis of both hands.  Right knee replacement has good range of motion.  Above-the-knee amputation of the left leg.  CDAI Exam: CDAI Score: -- Patient Global: --; Provider Global: -- Swollen: --; Tender: -- Joint Exam  10/02/2021   No joint exam has been documented for this visit   There is currently no information documented on the homunculus. Go to the Rheumatology activity and complete the homunculus joint exam.  Investigation: No additional findings.  Imaging: No results found.  Recent Labs: Lab Results  Component Value Date   WBC 8.5 06/14/2021   HGB 13.1 06/14/2021   PLT 243 06/14/2021   NA 138 06/14/2021   K 5.9 (H) 06/14/2021   CL 102 06/14/2021   CO2 31 06/14/2021   GLUCOSE 269 (H) 06/14/2021   BUN 36 (H) 06/14/2021   CREATININE 1.11 (H) 06/14/2021   BILITOT 0.3 06/14/2021   ALKPHOS 70 07/18/2016   AST 19 06/14/2021   ALT 86 (H) 06/14/2021   PROT 6.5 06/14/2021   ALBUMIN 3.8 07/18/2016   CALCIUM  9.7 06/14/2021   GFRAA 73 08/17/2020   QFTBGOLDPLUS NEGATIVE 09/20/2020    Speciality Comments: [place] started 08/24/20  Procedures:  No procedures performed Allergies: Apremilast, Aspirin, Bee venom, Cephalosporins, Clarithromycin, Codeine, Demerol, E-mycin [erythromycin base], Etodolac, Gatifloxacin, Iodinated contrast media, Levaquin  [levofloxacin in d5w], Levofloxacin, Levofloxacin, Lyrica [pregabalin], Meperidine, Morphine and related, Nsaids, Other, Phenothiazines, Shellfish-derived products, Tequin, Penicillins, and Sulfa drugs cross reactors   Assessment / Plan:     Visit Diagnoses: Psoriatic arthropathy (HCC): She has no synovitis or dactylitis on examination today.  She has been experiencing increased pain, stiffness, and intermittent swelling in her hands about 1 week prior to when she is due for her Cosentyx dose.  She has also had small patches of plaque psoriasis on her scalp about 1 week prior to when she is due for Cosentyx.  Her symptoms typically resolve once she is administered Cosentyx and are alleviated for about 3 weeks.  On examination today she had no evidence of plantar fasciitis or Achilles tendinitis.  No SI joint tenderness upon palpation.  She has not had any  signs or symptoms of uveitis.  Discussed different treatment options today including switching from Cosentyx to [employer].  She would like to hold off on making any medication changes at this time.  She will remain on Cosentyx 300 mg subcutaneous injections every 4 weeks.  She was advised to notify us if she continues to have breakthrough symptoms at which time we can schedule a sooner follow-up visit to discuss possibly switching to Talt[place].  She was in agreement.  She will follow-up in the office in 3 months or sooner if needed.  Other psoriasis: She has been experiencing small patches of plaque psoriasis on her scalp about 1 week prior to when she is due for her next Cosentyx dose.  Once administering Cosentyx the patches of psoriasis typically clear.  She declined a prescription for clobetasol shampoo or external solution at this time.  She would like to remain on Cosentyx as prescribed.  Discussed that if she continues to experience recurrent flares of psoriasis I would recommend switching to Taltz in the future.  High risk medication use - Cosentyx 300 mg sq injections every 4 weeks.  Initially started on 09/27/15-d/c in June 2021. Restarted on cosentyx on 09/27/20. - Plan: CBC with Differential/Platelet, COMPLETE METABOLIC PANEL WITH GFR, QuantiFERON-TB Gold Plus CBC and CMP were drawn on 06/14/2021.  Patient is due to update lab work today.  Orders for CBC and CMP were released.  Her next lab work will be due in November and every 3 months to monitor for drug toxicity.  Standing orders for CBC and CMP remain in place. TB gold negative on 09/20/20.  Order for TB gold released today. She has not had any recent or recurrent infections.  Discussed the importance of holding Cosentyx if she develops signs or symptoms of an infection and to resume once the infection is completely cleared.  Screening for tuberculosis -Order for TB gold released today.  Plan: QuantiFERON-TB Gold Plus  Primary osteoarthritis of both  hands: She has PIP and DIP thickening consistent with osteoarthritis of both hands.  She has been experiencing intermittent pain and stiffness in both hands especially 1 week prior to when her Cosentyx injections are due.  On examination today she did not have any synovitis or dactylitis.  Discussed the importance of joint protection and muscle strengthening.  She will notify us if her symptoms persist or worsen.  History of total right knee  replacement: Doing well.  Good range of motion with no warmth or effusion on examination today.  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  Patient has been getting up on a daily basis to walk but primarily relies on her motorized wheelchair for assistance.  Age-related osteoporosis without current pathological fracture - DEXA updated on 09/07/21: Lumbar spine BMD 1.1218 T-score 1.6.  LFN T-score -1.4, right forearm T-score -1.7.  She is currently being prescribed prolia.  She received her third Prolia injection on 09/20/2021.  Fibromyalgia: She continues to experience intermittent myalgias and muscle tenderness due to fibromyalgia.  Overall her symptoms have been manageable.  She remains on Cymbalta 60 mg 1 capsule daily.  She remains on a calcium and vitamin D supplement daily.  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  Chronic fatigue: Stable.  Contracture of hand joint, right - History of right 4th trigger finger release performed by Dr. Grandville Silos.  Unchanged.  Other medical conditions are listed as follows:  History of fibula fracture - Dr. Durene Romans  History of fracture of tibia - Dr. Durene Romans  History of diabetes mellitus  Dyslipidemia   Orders: Orders Placed This Encounter  Procedures   CBC with Differential/Platelet   COMPLETE METABOLIC PANEL WITH GFR   QuantiFERON-TB Gold Plus   No  orders of the defined types were placed in this encounter.     Follow-Up Instructions: Return in about 3 months (around 01/02/2022) for Psoriatic arthritis, Osteoarthritis.   Ofilia Neas, PA-C  Note - This record has been created using Dragon software.  Chart creation errors have been sought, but may not always  have been located. Such creation errors do not reflect on  the standard of medical care.

## 2021-09-29 ENCOUNTER — Other Ambulatory Visit (HOSPITAL_COMMUNITY): Payer: Self-pay

## 2021-10-02 ENCOUNTER — Ambulatory Visit: Payer: Medicare Other | Attending: Rheumatology | Admitting: Physician Assistant

## 2021-10-02 ENCOUNTER — Ambulatory Visit: Payer: PRIVATE HEALTH INSURANCE | Admitting: Physician Assistant

## 2021-10-02 ENCOUNTER — Encounter: Payer: Self-pay | Admitting: Physician Assistant

## 2021-10-02 VITALS — BP 96/56 | HR 64 | Resp 17 | Ht 66.5 in | Wt 226.0 lb

## 2021-10-02 DIAGNOSIS — Z111 Encounter for screening for respiratory tuberculosis: Secondary | ICD-10-CM

## 2021-10-02 DIAGNOSIS — L4050 Arthropathic psoriasis, unspecified: Secondary | ICD-10-CM | POA: Diagnosis not present

## 2021-10-02 DIAGNOSIS — F5101 Primary insomnia: Secondary | ICD-10-CM

## 2021-10-02 DIAGNOSIS — M810 Age-related osteoporosis without current pathological fracture: Secondary | ICD-10-CM

## 2021-10-02 DIAGNOSIS — E785 Hyperlipidemia, unspecified: Secondary | ICD-10-CM

## 2021-10-02 DIAGNOSIS — L408 Other psoriasis: Secondary | ICD-10-CM

## 2021-10-02 DIAGNOSIS — Z96651 Presence of right artificial knee joint: Secondary | ICD-10-CM

## 2021-10-02 DIAGNOSIS — Z79899 Other long term (current) drug therapy: Secondary | ICD-10-CM

## 2021-10-02 DIAGNOSIS — S78112A Complete traumatic amputation at level between left hip and knee, initial encounter: Secondary | ICD-10-CM

## 2021-10-02 DIAGNOSIS — M19042 Primary osteoarthritis, left hand: Secondary | ICD-10-CM

## 2021-10-02 DIAGNOSIS — Z8781 Personal history of (healed) traumatic fracture: Secondary | ICD-10-CM

## 2021-10-02 DIAGNOSIS — M19041 Primary osteoarthritis, right hand: Secondary | ICD-10-CM

## 2021-10-02 DIAGNOSIS — R5382 Chronic fatigue, unspecified: Secondary | ICD-10-CM

## 2021-10-02 DIAGNOSIS — Z8639 Personal history of other endocrine, nutritional and metabolic disease: Secondary | ICD-10-CM

## 2021-10-02 DIAGNOSIS — M797 Fibromyalgia: Secondary | ICD-10-CM

## 2021-10-02 DIAGNOSIS — M24541 Contracture, right hand: Secondary | ICD-10-CM

## 2021-10-02 NOTE — Patient Instructions (Signed)
Standing Labs We placed an order today for your standing lab work.   Please have your standing labs drawn in November and every 3 months   If possible, please have your labs drawn 2 weeks prior to your appointment so that the provider can discuss your results at your appointment.  Please note that you may see your imaging and lab results in MyChart before we have reviewed them. We may be awaiting multiple results to interpret others before contacting you. Please allow our office up to 72 hours to thoroughly review all of the results before contacting the office for clarification of your results.  We have open lab daily: Monday through Thursday from 1:30-4:30 PM and Friday from 1:30-4:00 PM at the office of Dr. Shaili Deveshwar, [facility].   Please be advised, all patients with office appointments requiring lab work will take precedent over walk-in lab work.  If possible, please come for your lab work on Monday and Friday afternoons, as you may experience shorter wait times. The office is located at [address], [address], [place], [place] [address] No appointment is necessary.   Labs are drawn by [facility]. Please bring your co-pay at the time of your lab draw.  You may receive a bill from [facility] for your lab work.  Please note if you are on Hydroxychloroquine and and an order has been placed for a Hydroxychloroquine level, you will need to have it drawn 4 hours or more after your last dose.  If you wish to have your labs drawn at another location, please call the office 24 hours in advance to send orders.  If you have any questions regarding directions or hours of operation,  please call [phone].   As a reminder, please drink plenty of water prior to coming for your lab work. Thanks!  

## 2021-10-03 NOTE — Progress Notes (Signed)
CBC WNL. Glucose is elevated-260. Creatinine is elevated-1.14 and GFR is low-52. Please clarify if she has been taking any NSAIDs.   Potassium is elevated-5.9.  please notify the patient and forward results to PCP.  Patient will likely need to have potassium rechecked.

## 2021-10-05 LAB — COMPLETE METABOLIC PANEL WITH GFR
AG Ratio: 1.3 (calc) (ref 1.0–2.5)
ALT: 23 U/L (ref 6–29)
AST: 16 U/L (ref 10–35)
Albumin: 3.6 g/dL (ref 3.6–5.1)
Alkaline phosphatase (APISO): 89 U/L (ref 37–153)
BUN/Creatinine Ratio: 21 (calc) (ref 6–22)
BUN: 24 mg/dL (ref 7–25)
CO2: 28 mmol/L (ref 20–32)
Calcium: 9.3 mg/dL (ref 8.6–10.4)
Chloride: 101 mmol/L (ref 98–110)
Creat: 1.14 mg/dL — ABNORMAL HIGH (ref 0.60–1.00)
Globulin: 2.7 g/dL (calc) (ref 1.9–3.7)
Glucose, Bld: 260 mg/dL — ABNORMAL HIGH (ref 65–99)
Potassium: 5.9 mmol/L — ABNORMAL HIGH (ref 3.5–5.3)
Sodium: 137 mmol/L (ref 135–146)
Total Bilirubin: 0.4 mg/dL (ref 0.2–1.2)
Total Protein: 6.3 g/dL (ref 6.1–8.1)
eGFR: 52 mL/min/{1.73_m2} — ABNORMAL LOW (ref >=60)

## 2021-10-05 LAB — QUANTIFERON-TB GOLD PLUS
Mitogen-NIL: >10 IU/mL
NIL: 0.04 IU/mL
QuantiFERON-TB Gold Plus: NEGATIVE
TB1-NIL: 0.23 IU/mL
TB2-NIL: 0 IU/mL

## 2021-10-05 LAB — CBC WITH DIFFERENTIAL/PLATELET
Absolute Monocytes: 573 cells/uL (ref 200–950)
Basophils Absolute: 27 cells/uL (ref 0–200)
Basophils Relative: 0.3 %
Eosinophils Absolute: 146 cells/uL (ref 15–500)
Eosinophils Relative: 1.6 %
HCT: 40.4 % (ref 35.0–45.0)
Hemoglobin: 13.2 g/dL (ref 11.7–15.5)
Lymphs Abs: 2630 cells/uL (ref 850–3900)
MCH: 28.8 pg (ref 27.0–33.0)
MCHC: 32.7 g/dL (ref 32.0–36.0)
MCV: 88.2 fL (ref 80.0–100.0)
MPV: 10.6 fL (ref 7.5–12.5)
Monocytes Relative: 6.3 %
Neutro Abs: 5724 cells/uL (ref 1500–7800)
Neutrophils Relative %: 62.9 %
Platelets: 245 10*3/uL (ref 140–400)
RBC: 4.58 10*6/uL (ref 3.80–5.10)
RDW: 11.8 % (ref 11.0–15.0)
Total Lymphocyte: 28.9 %
WBC: 9.1 10*3/uL (ref 3.8–10.8)

## 2021-10-05 NOTE — Progress Notes (Signed)
TB gold negative

## 2021-10-09 ENCOUNTER — Other Ambulatory Visit (HOSPITAL_COMMUNITY): Payer: Self-pay

## 2021-10-26 ENCOUNTER — Other Ambulatory Visit (HOSPITAL_COMMUNITY): Payer: Self-pay

## 2021-10-26 ENCOUNTER — Other Ambulatory Visit: Payer: Self-pay | Admitting: Rheumatology

## 2021-10-26 DIAGNOSIS — L4050 Arthropathic psoriasis, unspecified: Secondary | ICD-10-CM

## 2021-10-26 MED ORDER — COSENTYX SENSOREADY (300 MG) 150 MG/ML ~~LOC~~ SOAJ
300.0000 mg | SUBCUTANEOUS | 0 refills | Status: DC
  Filled 2021-10-26: qty 2, 28d supply, fill #0
  Filled 2021-11-29: qty 2, 28d supply, fill #1
  Filled 2022-01-01: qty 2, 28d supply, fill #2

## 2021-10-26 NOTE — Telephone Encounter (Signed)
Next Visit: 01/01/2022  Last Visit: 10/02/2021  Last Fill: 09/20/2021 (30 day supply)  AC:[id number] arthropathy   Current Dose per office note 10/02/2021: Cosentyx 300 mg sq injections every 4 weeks  Labs: 10/02/2021 CBC WNL. Glucose is elevated-260. Creatinine is elevated-1.14 and GFR is low-52.  TB Gold: 10/02/2021    Okay to refill Cosentyx?

## 2021-11-09 ENCOUNTER — Other Ambulatory Visit (HOSPITAL_COMMUNITY): Payer: Self-pay

## 2021-11-15 ENCOUNTER — Other Ambulatory Visit (HOSPITAL_COMMUNITY): Payer: Self-pay

## 2021-11-29 ENCOUNTER — Other Ambulatory Visit (HOSPITAL_COMMUNITY): Payer: Self-pay

## 2021-12-07 ENCOUNTER — Other Ambulatory Visit (HOSPITAL_COMMUNITY): Payer: Self-pay

## 2021-12-18 NOTE — Progress Notes (Signed)
Office Visit Note  Patient: [name]             Date of Birth: Jul 02, 1950           MRN: [id number]             PCP: Ardith Dark, PA-C Referring: Ardith Dark, PA-C Visit Date: 01/01/2022 Occupation: @GUAROCC @  Subjective:  Medication monitoring  History of Present Illness: [name] is a 71 y.o. female with history of psoriatic arthritis and osteoarthritis.  Patient remains on Cosentyx 300 mg sq injections every 4 weeks.  She has been tolerating Cosentyx without any side effects or injection site reactions.  She has not missed any doses of Cosentyx recently.  She denies any recent or recurrent infections.  She denies any signs or symptoms of a psoriatic arthritis flare.  She has no active psoriasis at this time.  She experiences occasional discomfort in her lower back mainly muscle spasms.  She denies any SI joint discomfort currently.  She denies any groin pain currently.  She denies any Achilles tendinitis or planter fasciitis of the right foot.  She presents today with some discomfort in the left [facility] joint.  She states last night she was trying to use some scissors and has had some residual soreness since then.  She denies any joint swelling.  She denies any other joint pain or inflammation at this time.    Activities of Daily Living:  Patient reports morning stiffness for all day. Patient Reports nocturnal pain.  Difficulty dressing/grooming: Reports Difficulty climbing stairs: Reports Difficulty getting out of chair: Reports Difficulty using hands for taps, buttons, cutlery, and/or writing: Denies  Review of Systems  Constitutional:  Positive for fatigue.  HENT:  Positive for mouth dryness. Negative for mouth sores.   Eyes:  Negative for dryness.  Respiratory:  Positive for shortness of breath.   Cardiovascular:  Negative for chest pain and palpitations.  Gastrointestinal:  Negative for blood in stool, constipation and diarrhea.  Endocrine: Negative for  increased urination.  Genitourinary:  Negative for involuntary urination.  Musculoskeletal:  Positive for joint pain, joint pain, myalgias, muscle weakness, morning stiffness, muscle tenderness and myalgias. Negative for joint swelling.  Skin:  Positive for sensitivity to sunlight. Negative for color change, rash and hair loss.  Allergic/Immunologic: Negative for susceptible to infections.  Neurological:  Positive for dizziness. Negative for headaches.  Hematological:  Negative for swollen glands.  Psychiatric/Behavioral:  Positive for depressed mood and sleep disturbance. The patient is nervous/anxious.     PMFS History:  Patient Active Problem List   Diagnosis Date Noted   Dyslipidemia 10/16/2016   History of total knee replacement, bilateral 10/16/2016   Primary osteoarthritis of both hands 10/16/2016   History of diabetes mellitus 10/16/2016   Other chronic pain 10/16/2016   History of glaucoma 10/16/2016   Psoriatic arthropathy ([place]) 07/06/2016   Other psoriasis 07/06/2016   High risk medications (not anticoagulants) long-term use 07/06/2016   Fibromyalgia 07/06/2016   Chronic fatigue 07/06/2016   Primary insomnia 07/06/2016    Past Medical History:  Diagnosis Date   Arthritis    Cataract    Diabetes mellitus without complication ([place])    Glaucoma    Hypertension    Vertigo     Family History  Problem Relation Age of Onset   Cancer Sister    Diabetes Sister    COPD Brother    Diabetes Brother    Past Surgical History:  Procedure Laterality Date  ABDOMINAL HYSTERECTOMY     APPENDECTOMY     CARPAL TUNNEL RELEASE     CHOLECYSTECTOMY     FEMUR FRACTURE SURGERY Right    FIBULA FRACTURE SURGERY Left 09/01/2018   LEG AMPUTATION Left 03/25/2020   REPLACEMENT TOTAL KNEE BILATERAL     TIBIA FRACTURE SURGERY Left 09/01/2018   TOTAL SHOULDER ARTHROPLASTY     TRIGGER FINGER RELEASE     TRIGGER FINGER RELEASE Right 02/2017   3rd and 4th digits.   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  Constitutional:      Appearance: She is well-developed.  HENT:     Head: Normocephalic and atraumatic.  Eyes:     Conjunctiva/sclera: Conjunctivae normal.  Cardiovascular:     Rate and Rhythm: Normal rate and regular rhythm.     Heart sounds: Normal heart sounds.  Pulmonary:     Effort: Pulmonary effort is normal.     Breath sounds: Normal breath sounds.  Abdominal:     General: Bowel sounds are normal.     Palpations: Abdomen is soft.  Musculoskeletal:     Cervical back: Normal range of motion.  Skin:    General: Skin is warm and dry.     Capillary Refill: Capillary refill takes less than 2 seconds.  Neurological:     Mental Status: She is alert and oriented to person, place, and time.  Psychiatric:        Behavior: Behavior normal.      Musculoskeletal Exam: C-spine has good ROM. Postural thoracic kyphosis.  Shoulder joints have good ROM with some discomfort and stiffness in both shoulders.  Elbow joints and wrist joints. Right 4th contracture.  PIP and DIP thickening consistent with osteoarthritis.  Right knee replacement has good ROM with warmth but no effusion.  Above left knee amputation.  No evidence of achilles tendonitis or plantar fasciitis of right foot.  Mild pitting edema right LE.   CDAI Exam: CDAI Score: -- Patient Global: --; Provider Global: -- Swollen: --; Tender: -- Joint Exam 01/01/2022   No joint exam has been documented for this visit   There is currently no information documented on the homunculus. Go to the Rheumatology activity  and complete the homunculus joint exam.  Investigation: No additional findings.  Imaging: No results found.  Recent Labs: Lab Results  Component Value Date   WBC 9.1 10/02/2021   HGB 13.2 10/02/2021   PLT 245 10/02/2021   NA 137 10/02/2021   K 5.9 (H) 10/02/2021   CL 101 10/02/2021   CO2 28 10/02/2021   GLUCOSE 260 (H) 10/02/2021   BUN 24 10/02/2021   CREATININE 1.14 (H) 10/02/2021   BILITOT 0.4 10/02/2021   ALKPHOS 70 07/18/2016   AST 16 10/02/2021   ALT 23 10/02/2021   PROT 6.3 10/02/2021   ALBUMIN 3.8 07/18/2016   CALCIUM 9.3 10/02/2021   GFRAA 73 08/17/2020   QFTBGOLDPLUS NEGATIVE 10/02/2021    Speciality Comments: [place] started 08/24/20  Procedures:  No procedures performed Allergies: Apremilast, Aspirin, Bee venom, Cephalosporins,  Clarithromycin, Codeine, Demerol, E-mycin [erythromycin base], Etodolac, Gatifloxacin, Iodinated contrast media, Levaquin  [levofloxacin in d5w], Levofloxacin, Levofloxacin, Lyrica [pregabalin], Meperidine, Morphine and related, Nsaids, Other, Phenothiazines, Shellfish-derived products, Tequin, Penicillins, and Sulfa drugs cross reactors   Assessment / Plan:     Visit Diagnoses: Psoriatic arthropathy (HCC): She has no synovitis or dactylitis on examination today.  She has not had any signs or symptoms of a psoriatic arthritis flare.  She has clinically been doing well on Cosentyx 300 mg subcutaneous injections every 4 weeks.  She continues to tolerate Cosentyx without any side effects or injection site reactions.  She is not experiencing any SI joint discomfort at this time.  She has no evidence of Achilles tendinitis or planter fasciitis of the right foot.  No active psoriasis at this time.  She presents today with some discomfort in the left [facility] joint.  Conservative treatment options were discussed including the use of Voltaren gel as well as a left CMC joint brace.  If her symptoms persist or worsen she can return for an ultrasound-guided left  [facility] joint injection.  She will remain on Cosentyx as prescribed.  She was advised to notify us if she develops signs or symptoms of a flare.  She will follow-up in the office in 3 months or sooner if needed.  Other psoriasis: She has no active psoriasis at this time.  She has not needed to use clobetasol cream recently.  High risk medication use - Cosentyx 300 mg sq injections every 4 weeks.  Initially started on 09/27/15-d/c in June 2021. Restarted on cosentyx on 09/27/20.  CBC and CMP updated on 10/30/21.  Orders for CBC and CMP were released today. TB gold negative on 10/02/21.  Discussed the importance of holding cosentyx if she develops signs or symptoms of an infection and to resume once the infection has completely cleared.  - Plan: CBC with Differential/Platelet, COMPLETE METABOLIC PANEL WITH GFR, CBC with Differential/Platelet, COMPLETE METABOLIC PANEL WITH GFR  Primary osteoarthritis of both hands: She presents today with left CMC joint tenderness.  Yesterday she was using scissors and is having some residual discomfort and stiffness.  No synovitis was noted on examination today.  Discussed the use of Voltaren gel which she can buy topically as needed for pain relief.  I also discussed the use of a left CMC joint brace.  A prescription for a left CMC joint brace will be mailed to the patient today.  If her symptoms persist or worsen she can return for an ultrasound-guided left [facility] joint cortisone injection.  History of total right knee replacement: Doing well overall.  Good range of motion on examination today.  Warmth but no effusion noted.  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  Patient remains primarily wheelchair-bound.  Age-related osteoporosis without current pathological fracture - DEXA updated on 09/07/21: Lumbar spine BMD 1.1218 T-score 1.6.  LFN T-score -1.4, right forearm T-score -1.7.  She  is currently on Prolia 60 mg subcutaneous injections every 6 months.  She is taking vitamin D 1000 units daily.  Fibromyalgia - She experiences intermittent myalgias and muscle tenderness due to fibromyalgia.  She has been experiencing intermittent muscle spasms in her lower back.  She remains on Cymbalta 60 mg 1 capsule daily.  Overall her pain level has been well managed.  Chronic fatigue: Stable.  Other medical conditions are listed as follows:  Primary insomnia  Contracture of  hand joint, right - History of right 4th trigger finger release performed by Dr. Janee Morn.   History of fracture of tibia - Dr. Earna Coder  History of fibula fracture - Dr. Earna Coder  Dyslipidemia  History of diabetes mellitus  Orders: Orders Placed This Encounter  Procedures   CBC with Differential/Platelet   COMPLETE METABOLIC PANEL WITH GFR   CBC with Differential/Platelet   COMPLETE METABOLIC PANEL WITH GFR   No orders of the defined types were placed in this encounter.   Follow-Up Instructions: Return in about 3 months (around 04/03/2022) for Psoriatic arthritis.   Gearldine Bienenstock, PA-C  Note - This record has been created using Dragon software.  Chart creation errors have been sought, but may not always  have been located. Such creation errors do not reflect on  the standard of medical care.

## 2022-01-01 ENCOUNTER — Ambulatory Visit: Payer: Medicare Other | Attending: Physician Assistant | Admitting: Physician Assistant

## 2022-01-01 ENCOUNTER — Other Ambulatory Visit (HOSPITAL_COMMUNITY): Payer: Self-pay

## 2022-01-01 ENCOUNTER — Encounter: Payer: Self-pay | Admitting: Physician Assistant

## 2022-01-01 VITALS — BP 110/63 | HR 67 | Resp 17 | Ht 66.5 in | Wt 226.0 lb

## 2022-01-01 DIAGNOSIS — F5101 Primary insomnia: Secondary | ICD-10-CM

## 2022-01-01 DIAGNOSIS — M797 Fibromyalgia: Secondary | ICD-10-CM

## 2022-01-01 DIAGNOSIS — L4050 Arthropathic psoriasis, unspecified: Secondary | ICD-10-CM | POA: Diagnosis not present

## 2022-01-01 DIAGNOSIS — M810 Age-related osteoporosis without current pathological fracture: Secondary | ICD-10-CM

## 2022-01-01 DIAGNOSIS — Z79899 Other long term (current) drug therapy: Secondary | ICD-10-CM | POA: Diagnosis not present

## 2022-01-01 DIAGNOSIS — Z8639 Personal history of other endocrine, nutritional and metabolic disease: Secondary | ICD-10-CM

## 2022-01-01 DIAGNOSIS — E785 Hyperlipidemia, unspecified: Secondary | ICD-10-CM

## 2022-01-01 DIAGNOSIS — S78112A Complete traumatic amputation at level between left hip and knee, initial encounter: Secondary | ICD-10-CM

## 2022-01-01 DIAGNOSIS — M19041 Primary osteoarthritis, right hand: Secondary | ICD-10-CM

## 2022-01-01 DIAGNOSIS — L408 Other psoriasis: Secondary | ICD-10-CM

## 2022-01-01 DIAGNOSIS — Z8781 Personal history of (healed) traumatic fracture: Secondary | ICD-10-CM

## 2022-01-01 DIAGNOSIS — Z96651 Presence of right artificial knee joint: Secondary | ICD-10-CM

## 2022-01-01 DIAGNOSIS — M24541 Contracture, right hand: Secondary | ICD-10-CM

## 2022-01-01 DIAGNOSIS — R5382 Chronic fatigue, unspecified: Secondary | ICD-10-CM

## 2022-01-01 DIAGNOSIS — M19042 Primary osteoarthritis, left hand: Secondary | ICD-10-CM

## 2022-01-02 LAB — COMPLETE METABOLIC PANEL WITH GFR
AG Ratio: 1.3 (calc) (ref 1.0–2.5)
ALT: 48 U/L — ABNORMAL HIGH (ref 6–29)
AST: 43 U/L — ABNORMAL HIGH (ref 10–35)
Albumin: 3.6 g/dL (ref 3.6–5.1)
Alkaline phosphatase (APISO): 100 U/L (ref 37–153)
BUN: 23 mg/dL (ref 7–25)
CO2: 30 mmol/L (ref 20–32)
Calcium: 8.9 mg/dL (ref 8.6–10.4)
Chloride: 102 mmol/L (ref 98–110)
Creat: 1 mg/dL (ref 0.60–1.00)
Globulin: 2.7 g/dL (calc) (ref 1.9–3.7)
Glucose, Bld: 167 mg/dL — ABNORMAL HIGH (ref 65–139)
Potassium: 5.4 mmol/L — ABNORMAL HIGH (ref 3.5–5.3)
Sodium: 138 mmol/L (ref 135–146)
Total Bilirubin: 0.3 mg/dL (ref 0.2–1.2)
Total Protein: 6.3 g/dL (ref 6.1–8.1)
eGFR: 60 mL/min/{1.73_m2} (ref >=60)

## 2022-01-02 LAB — CBC WITH DIFFERENTIAL/PLATELET
Absolute Monocytes: 684 cells/uL (ref 200–950)
Basophils Absolute: 27 cells/uL (ref 0–200)
Basophils Relative: 0.3 %
Eosinophils Absolute: 162 cells/uL (ref 15–500)
Eosinophils Relative: 1.8 %
HCT: 40.9 % (ref 35.0–45.0)
Hemoglobin: 13.6 g/dL (ref 11.7–15.5)
Lymphs Abs: 2475 cells/uL (ref 850–3900)
MCH: 28.5 pg (ref 27.0–33.0)
MCHC: 33.3 g/dL (ref 32.0–36.0)
MCV: 85.7 fL (ref 80.0–100.0)
MPV: 11.5 fL (ref 7.5–12.5)
Monocytes Relative: 7.6 %
Neutro Abs: 5652 cells/uL (ref 1500–7800)
Neutrophils Relative %: 62.8 %
Platelets: 242 10*3/uL (ref 140–400)
RBC: 4.77 10*6/uL (ref 3.80–5.10)
RDW: 11.6 % (ref 11.0–15.0)
Total Lymphocyte: 27.5 %
WBC: 9 10*3/uL (ref 3.8–10.8)

## 2022-01-02 NOTE — Progress Notes (Signed)
CBC WNL.   Glucose is elevated-167.  Potassium is borderline elevated-5.4.  AST and ALT are slightly elevated. Please clarify if she has been taking any tylenol or alcohol use?

## 2022-01-04 ENCOUNTER — Other Ambulatory Visit (HOSPITAL_COMMUNITY): Payer: Self-pay

## 2022-01-23 ENCOUNTER — Telehealth: Payer: Self-pay | Admitting: Pharmacist

## 2022-01-23 NOTE — Telephone Encounter (Signed)
Received notification from [facility] regarding a prior authorization for COSENTYX. Authorization has been APPROVED from 01/23/22 to 02/19/2023. Approval letter sent to scan center.  Patient can continue to fill through [facility]: [phone]   Authorization # [id number]  Chesley Mires, PharmD, MPH, BCPS, CPP Clinical Pharmacist (Rheumatology and Pulmonology)

## 2022-01-23 NOTE — Telephone Encounter (Signed)
Submitted a Prior Authorization renewal request to [facility] for COSENTYX via CoverMyMeds. Will update once we receive a response.  Key: Soyla Dryer, PharmD, MPH, BCPS, CPP Clinical Pharmacist (Rheumatology and Pulmonology)

## 2022-01-25 ENCOUNTER — Other Ambulatory Visit (HOSPITAL_COMMUNITY): Payer: Self-pay

## 2022-01-25 ENCOUNTER — Other Ambulatory Visit: Payer: Self-pay | Admitting: Physician Assistant

## 2022-01-25 DIAGNOSIS — L4050 Arthropathic psoriasis, unspecified: Secondary | ICD-10-CM

## 2022-01-25 MED ORDER — COSENTYX SENSOREADY (300 MG) 150 MG/ML ~~LOC~~ SOAJ
300.0000 mg | SUBCUTANEOUS | 0 refills | Status: DC
  Filled 2022-01-25 – 2022-01-30 (×2): qty 6, 84d supply, fill #0
  Filled 2022-02-02: qty 2, 28d supply, fill #0

## 2022-01-25 NOTE — Telephone Encounter (Signed)
Next Visit: 04/09/2022  Last Visit: 01/01/2022  Last Fill: 10/26/2021  BX:[id number] arthropathy   Current Dose per office note on 01/01/2022: Cosentyx 300 mg sq injections every 4 weeks.   Labs: 01/01/2022 CBC WNL.   Glucose is elevated-167. Potassium is borderline elevated-5.4. AST and ALT are slightly elevated.   TB Gold: 10/02/2021 negative    Okay to refill cosentyx?

## 2022-01-30 ENCOUNTER — Other Ambulatory Visit (HOSPITAL_COMMUNITY): Payer: Self-pay

## 2022-02-02 ENCOUNTER — Other Ambulatory Visit: Payer: Self-pay

## 2022-02-02 ENCOUNTER — Other Ambulatory Visit (HOSPITAL_COMMUNITY): Payer: Self-pay

## 2022-02-06 ENCOUNTER — Other Ambulatory Visit: Payer: Self-pay

## 2022-02-06 ENCOUNTER — Other Ambulatory Visit (HOSPITAL_COMMUNITY): Payer: Self-pay

## 2022-02-22 NOTE — Telephone Encounter (Signed)
Please schedule follow up with Dr. Estanislado Pandy to discuss treatment options.

## 2022-02-28 ENCOUNTER — Other Ambulatory Visit (HOSPITAL_COMMUNITY): Payer: Self-pay

## 2022-03-27 NOTE — Progress Notes (Unsigned)
Office Visit Note  Patient: [name]             Date of Birth: 19-Jan-1951           MRN: [id number]             PCP: Ardith Dark, PA-C Referring: Ardith Dark, PA-C Visit Date: 04/09/2022 Occupation: @GUAROCC$ @  Subjective:  Discuss medication options   History of Present Illness: [name] is a 72 y.o. female with history of psoriatic arthritis and osteoarthritis.  Patient reports that her last dose of Cosentyx was administered in November 2023.  Patient reports that she did not feel that Cosentyx was managing her symptoms so she tried discontinuing.  She states that her symptoms have significantly worsened since discontinuing Cosentyx.  She presents today to discuss either restarting of Cosentyx or to discuss other treatment options.  Patient reports that she has psoriasis on her right elbow and her scalp.  She is having severe pain in both shoulders, left greater than right and both hands.  She has persistent pain in both CMC joints as well as intermittent pain and swelling in the left wrist joint.  She denies any recent or recurrent infections.   Activities of Daily Living:  Patient reports morning stiffness for 4 hours.   Patient Reports nocturnal pain.  Difficulty dressing/grooming: Denies Difficulty climbing stairs: Reports Difficulty getting out of chair: Reports Difficulty using hands for taps, buttons, cutlery, and/or writing: Reports  Review of Systems  Constitutional:  Positive for fatigue.  HENT:  Positive for mouth dryness. Negative for mouth sores.   Eyes:  Positive for dryness.  Respiratory:  Negative for shortness of breath.   Cardiovascular:  Negative for chest pain and palpitations.  Gastrointestinal:  Negative for blood in stool, constipation and diarrhea.  Endocrine: Negative for increased urination.  Genitourinary:  Negative for involuntary urination.  Musculoskeletal:  Positive for joint pain, joint pain, joint swelling, myalgias,  muscle weakness, morning stiffness, muscle tenderness and myalgias. Negative for gait problem.  Skin:  Positive for rash. Negative for color change, hair loss and sensitivity to sunlight.  Allergic/Immunologic: Positive for susceptible to infections.  Neurological:  Positive for headaches. Negative for dizziness.  Hematological:  Negative for swollen glands.  Psychiatric/Behavioral:  Positive for depressed mood and sleep disturbance. The patient is nervous/anxious.     PMFS History:  Patient Active Problem List   Diagnosis Date Noted   Dyslipidemia 10/16/2016   History of total knee replacement, bilateral 10/16/2016   Primary osteoarthritis of both hands 10/16/2016   History of diabetes mellitus 10/16/2016   Other chronic pain 10/16/2016   History of glaucoma 10/16/2016   Psoriatic arthropathy ([place]) 07/06/2016   Other psoriasis 07/06/2016   High risk medications (not anticoagulants) long-term use 07/06/2016   Fibromyalgia 07/06/2016   Chronic fatigue 07/06/2016   Primary insomnia 07/06/2016    Past Medical History:  Diagnosis Date   Arthritis    Cataract    Diabetes mellitus without complication ([place])    Glaucoma    Hypertension    Vertigo     Family History  Problem Relation Age of Onset   Cancer Sister    Diabetes Sister    COPD Brother    Diabetes Brother    Past Surgical History:  Procedure Laterality Date   ABDOMINAL HYSTERECTOMY     APPENDECTOMY     CARPAL TUNNEL RELEASE     CHOLECYSTECTOMY     FEMUR FRACTURE SURGERY Right  FIBULA FRACTURE SURGERY Left 09/01/2018   LEG AMPUTATION Left 03/25/2020   REPLACEMENT TOTAL KNEE BILATERAL     TIBIA FRACTURE SURGERY Left 09/01/2018   TOTAL SHOULDER ARTHROPLASTY     TRIGGER FINGER RELEASE     TRIGGER FINGER RELEASE Right 02/2017   3rd and 4th digits.   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  Constitutional:      Appearance: She is well-developed.  HENT:     Head: Normocephalic and atraumatic.  Eyes:     Conjunctiva/sclera: Conjunctivae normal.  Cardiovascular:     Rate and Rhythm: Normal rate and regular rhythm.     Heart sounds: Normal heart sounds.  Pulmonary:     Effort: Pulmonary effort is normal.     Breath sounds: Normal breath sounds.  Abdominal:     General: Bowel sounds are normal.     Palpations: Abdomen is soft.  Musculoskeletal:     Cervical back: Normal range of motion.  Skin:    General: Skin is warm and dry.     Capillary Refill: Capillary refill takes less than 2 seconds.  Neurological:     Mental Status: She is alert and oriented to person, place, and time.  Psychiatric:        Behavior: Behavior normal.      Musculoskeletal Exam: Patient remained in the wheelchair during the exam.  C-spine has limited ROM.  Thoracic kyphosis noted.  Painful ROM of both shoulders, left >right.  Elbow joints have good range of motion.  Painful range of motion of the left wrist.  Tenderness over both CMC joints.  PIP and DIP thickening.  Right fourth PIP contracture.  Hip joints difficult to assess in seated position.  Right knee replacement has good range of motion with no warmth or effusion.  Above the knee amputation-left.  No evidence of achilles tendonitis or plantar fasciitis.    CDAI Exam: CDAI Score: -- Patient Global: --; Provider Global: -- Swollen: --; Tender: -- Joint Exam 04/09/2022   No joint exam has been documented for this visit   There is currently no information documented on the homunculus. Go to the Rheumatology activity  and complete the homunculus joint exam.  Investigation: No additional findings.  Imaging: No results found.  Recent Labs: Lab Results  Component Value Date   WBC 9.0 01/01/2022   HGB 13.6 01/01/2022   PLT 242 01/01/2022   NA 138 01/01/2022   K 5.4 (H) 01/01/2022   CL 102 01/01/2022   CO2 30 01/01/2022   GLUCOSE 167 (H) 01/01/2022   BUN 23 01/01/2022   CREATININE 1.00 01/01/2022   BILITOT 0.3 01/01/2022   ALKPHOS 70 07/18/2016   AST 43 (H) 01/01/2022   ALT 48 (H) 01/01/2022   PROT 6.3 01/01/2022   ALBUMIN 3.8 07/18/2016   CALCIUM 8.9 01/01/2022   GFRAA 73 08/17/2020   QFTBGOLDPLUS NEGATIVE 10/02/2021    Speciality Comments: Rutherford Nail started 08/24/20  Procedures:  No procedures performed Allergies: Apremilast, Aspirin, Bee venom, Cephalosporins, Clarithromycin,  Codeine, Demerol, E-mycin [erythromycin base], Etodolac, Gatifloxacin, Iodinated contrast media, Levaquin  [levofloxacin in d5w], Levofloxacin, Levofloxacin, Lyrica [pregabalin], Meperidine, Morphine and related, Nsaids, Other, Phenothiazines, Shellfish-derived products, Tequin, Penicillins, and Sulfa drugs cross reactors      Assessment / Plan:     Visit Diagnoses: Psoriatic arthropathy [facility]): Patient presents today experiencing a flare of psoriasis on the extensor surface of her right elbow and scalp.  She is also been experiencing increased joint pain, stiffness, and intermittent swelling involving multiple joints.  Her pain has been most severe in both hands and both wrist joints.  Her last dose of Cosentyx was administered in November 2023.  She discontinued Cosentyx due to not finding it to be effective at managing her psoriatic arthritis.  She has not realized that Cosentyx was helping to manage her symptoms and she would like to either get back on Cosentyx or discuss other treatment options.  Different treatment options were discussed today in detail including reviewing previous medications used.  Reviewed the  indications, contraindications, and potential side effects of Taltz today.  Plan to apply for Taltz through her insurance and once approved she will return to the office for administration of the first injection.  She was in agreement.  Do not plan on initiating the loading dose due to the concern for immunosuppression.  Maintenance dose of Donnetta Hail will be 80 mg subcutaneous injections every 28 days.  Plan to update CBC and CMP at new start visit since there was no lab tech today in the office.  She will follow-up in the office in 6 to 8 weeks to assess her response.  She will continue to require lab work 1 month and every 3 months after initiating therapy.  Medication counseling:  Baseline Immunosuppressant Therapy Labs TB GOLD    Latest Ref Rng & Units 10/02/2021    2:49 PM  Quantiferon TB Gold  Quantiferon TB Gold Plus NEGATIVE NEGATIVE    Hepatitis Panel    Latest Ref Rng & Units 11/16/2020    1:35 PM  Hepatitis  Hep B Surface Ag NON-REACTIVE NON-REACTIVE   Hep B IgM NON-REACTIVE NON-REACTIVE    HIV No results found for: "HIV" Immunoglobulins    Latest Ref Rng & Units 11/16/2020    1:35 PM  Immunoglobulin Electrophoresis  IgA  70 - 320 mg/dL 559   IgG 600 - 1,540 mg/dL 947   IgM 50 - 300 mg/dL 83    SPEP    Latest Ref Rng & Units 01/01/2022    1:16 PM  Serum Protein Electrophoresis  Total Protein 6.1 - 8.1 g/dL 6.3    Chest Xray: 12/21/03  Does patient have a history of inflammatory bowel disease? No  Counseled patient that Donnetta Hail is a IL-17 inhibitor that works to reduce pain and inflammation associated with arthritis.  Counseled patient on purpose, proper use, and adverse effects of Taltz. Reviewed the most common adverse effects of infection, inflammatory bowel disease, and allergic reaction. Counseled patient that Donnetta Hail should be held for infection and prior to scheduled surgery.  Counseled patient to avoid live vaccines while on Taltz.  Advised patient to get annual  influenza vaccine, pneumococcal vaccine, and Shingrix as indicated.  Reviewed storage information for Taltz.  Reviewed the importance of regular labs while on [employer]. Standing orders placed and is to return in 1 month and then every 3 months after initiation.  Provided patient with medication education material and answered all questions.  Patient consented to [place].  Will upload consent  into patient's chart.  Will apply for Taltz through patient's insurance and update when we receive a response.  Advised initial injection must be administered in office.  Patient voiced understanding.    Taltz dose will be: 80 mg every 28 days  Prescription will be sent to pharmacy pending lab results and insurance approval.  Other psoriasis: Small patches of psoriasis noted on extensor surface of right elbow and on scalp. Plan to initiate Taltz.   High risk medication use -Plan to apply for Taltz 80 mg subcutaneous injections once every month.  Plan to initiate maintenance dose and skip the loading dose due to the risk for immunosuppression. Her last dose of Cosentyx was administered at the end of November 2023. CBC and CMP were drawn on 01/01/2022.  No [facility] lab tech was in the office today.  Plan on updating CBC and CMP at her new start visit and then 1 month and every 3 months to monitor for drug toxicity. TB Gold negative on 10/02/2021. Discussed the importance of holding Taltz if she develops signs or symptoms of an infection and to resume once the infection is completely cleared. Previously on MTX (elevated LFTs in 2017), Remicade-reaction, Enbrel, humira, otezla. Allergy to sulfa,Discontinued Otezla due to difficulty swallowing.  Inadequate response to Cosentyx.  Primary osteoarthritis of both hands: She has been experiencing significant pain and stiffness in both hands.  She has noticed intermittent joint swelling.  On examination today she had tenderness over all PIP and DIP joints as well as the right second  and third MCP joints.  Tenderness over both wrist joints also noted.  Plan to switch from Cosentyx to [place] for better management of psoriatic arthritis as discussed above.  History of total right knee replacement: Doing well.  Good range of motion with no warmth or effusion.  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  Patient remains primarily wheelchair-bound.  Age-related osteoporosis without current pathological fracture - DEXA 09/07/21: Lumbar spine BMD 1.1218 T-score 1.6.  LFN T-score -1.4, right forearm T-score -1.7. Prolia 60 mg subcutaneous injections every 6 months.  Fibromyalgia -She continues to have generalized hyperalgesia and positive tender points due to fibromyalgia.  She continues to have generalized myalgias and muscle tenderness due to fibromyalgia.  She remains on Cymbalta 60 mg 1 capsule daily.  Chronic fatigue: Chronic  Primary insomnia: She continues to have difficulty sleeping at night.  Discussed the importance of good sleep hygiene.  Contracture of hand joint, right - History of right 4th trigger finger release performed by Dr. Grandville Silos.  History of fracture of tibia - Dr. Durene Romans  History of fibula fracture - Dr. Durene Romans  Other medical conditions are listed as follows:   Dyslipidemia  History of diabetes mellitus  Orders: No orders of the defined types were placed in this encounter.  No orders of the defined types were placed in this encounter.   Follow-Up Instructions: Return in about 8 weeks (around 06/04/2022) for Psoriatic arthritis, Osteoarthritis.   Ofilia Neas, PA-C  Note - This record has been created using Dragon software.  Chart creation errors have been sought, but may not always  have been located. Such creation errors do not reflect on  the standard of medical care.

## 2022-04-09 ENCOUNTER — Encounter: Payer: Self-pay | Admitting: Physician Assistant

## 2022-04-09 ENCOUNTER — Ambulatory Visit: Payer: 59 | Attending: Physician Assistant | Admitting: Physician Assistant

## 2022-04-09 VITALS — BP 92/53 | HR 62 | Resp 16 | Ht 66.5 in | Wt 225.0 lb

## 2022-04-09 DIAGNOSIS — Z79899 Other long term (current) drug therapy: Secondary | ICD-10-CM | POA: Diagnosis not present

## 2022-04-09 DIAGNOSIS — M24541 Contracture, right hand: Secondary | ICD-10-CM

## 2022-04-09 DIAGNOSIS — Z96651 Presence of right artificial knee joint: Secondary | ICD-10-CM

## 2022-04-09 DIAGNOSIS — F5101 Primary insomnia: Secondary | ICD-10-CM

## 2022-04-09 DIAGNOSIS — L4050 Arthropathic psoriasis, unspecified: Secondary | ICD-10-CM

## 2022-04-09 DIAGNOSIS — L408 Other psoriasis: Secondary | ICD-10-CM

## 2022-04-09 DIAGNOSIS — M797 Fibromyalgia: Secondary | ICD-10-CM

## 2022-04-09 DIAGNOSIS — R5382 Chronic fatigue, unspecified: Secondary | ICD-10-CM

## 2022-04-09 DIAGNOSIS — E785 Hyperlipidemia, unspecified: Secondary | ICD-10-CM

## 2022-04-09 DIAGNOSIS — Z8639 Personal history of other endocrine, nutritional and metabolic disease: Secondary | ICD-10-CM

## 2022-04-09 DIAGNOSIS — M19042 Primary osteoarthritis, left hand: Secondary | ICD-10-CM

## 2022-04-09 DIAGNOSIS — M19041 Primary osteoarthritis, right hand: Secondary | ICD-10-CM

## 2022-04-09 DIAGNOSIS — S78112A Complete traumatic amputation at level between left hip and knee, initial encounter: Secondary | ICD-10-CM

## 2022-04-09 DIAGNOSIS — Z8781 Personal history of (healed) traumatic fracture: Secondary | ICD-10-CM

## 2022-04-09 DIAGNOSIS — M810 Age-related osteoporosis without current pathological fracture: Secondary | ICD-10-CM

## 2022-04-09 NOTE — Patient Instructions (Addendum)
Ixekizumab Injection What is this medication? IXEKIZUMAB (ix e KIZ ue mab) treats autoimmune conditions, such as psoriasis and arthritis. It works by slowing down an overactive immune system. It is a monoclonal antibody. This medicine may be used for other purposes; ask your health care provider or pharmacist if you have questions. COMMON BRAND NAME(S): TALTZ What should I tell my care team before I take this medication? They need to know if you have any of these conditions: Immune system problems Infection, such as viral infection, chickenpox, cold sores, or herpes Recently received or are scheduled to receive a vaccine Tuberculosis, a positive skin test for tuberculosis, or recent close contact with someone who has tuberculosis An unusual or allergic reaction to ixekizumab, other medications, foods, dyes, or preservatives Pregnant or trying to get pregnant Breast-feeding How should I use this medication? This medication is injected under the skin. It is usually given by your care team in a hospital or clinic setting. It may also be given at home. If you get this medication at home, you will be taught how to prepare and give it. Use exactly as directed. Take it as directed on the prescription label at the same time every day. Keep taking it unless your care team tells you to stop. It is important that you put your used needles and syringes in a special sharps container. Do not put them in a trash can. If you do not have a sharps container, call your pharmacist or care team to get one. A special MedGuide will be given to you by the pharmacist with each prescription and refill. If you are getting this medication in a hospital or clinic, a special MedGuide will be given to you before each treatment. Be sure to read this information carefully each time. Talk to your care team about the use of this medication in children. While it be prescribed for children as young as 6 years for selected conditions,  precautions do apply. Overdosage: If you think you have taken too much of this medicine contact a poison control center or emergency room at once. NOTE: This medicine is only for you. Do not share this medicine with others. What if I miss a dose? If you get this medication at the hospital or clinic: It is important not to miss your dose. Call your care team if you are unable to keep an appointment. If you give yourself the medication at home: If you miss a dose, take it as soon as you can. Then continue your normal schedule. Do not take double or extra doses. Call your care team with questions. What may interact with this medication? Do not take this medication with any of the following: Live virus vaccines This medication may also interact with the following: Inactivated vaccines This list may not describe all possible interactions. Give your health care provider a list of all the medicines, herbs, non-prescription drugs, or dietary supplements you use. Also tell them if you smoke, drink alcohol, or use illegal drugs. Some items may interact with your medicine. What should I watch for while using this medication? Visit your care team for regular checks on your progress. Tell your care team if your symptoms do not start to get better or if they get worse. You will be tested for tuberculosis (TB) before you start this medication. If your care team prescribes any medication for TB, you should start taking the TB medication before starting this medication. Make sure to finish the full course of TB  medication. This medication may increase your risk of getting an infection. Call your care team for advice if you get a fever, chills, sore throat, or other symptoms of a cold or flu. Do not treat yourself. Try to avoid being around people who are sick. This medication can decrease the response to a vaccine. If you need to get vaccinated, tell your care team if you have received this medication within the last  6 months. Extra booster doses may be needed. Talk to your care team to see if a different vaccination schedule is needed. What side effects may I notice from receiving this medication? Side effects that you should report to your care team as soon as possible: Allergic reactions--skin rash, itching, hives, swelling of the face, lips, tongue, or throat Infection--fever, chills, cough, sore throat, wounds that don't heal, pain or trouble when passing urine, general feeling of discomfort or being unwell Sudden or severe stomach pain, bloody diarrhea, fever, nausea, vomiting Side effects that usually do not require medical attention (report to your care team if they continue or are bothersome): Nausea Pain, redness, or irritation at injection site Runny or stuffy nose Sore throat This list may not describe all possible side effects. Call your doctor for medical advice about side effects. You may report side effects to FDA at 1-800-FDA-1088. Where should I keep my medication? Keep out of the reach of children and pets. Store in the refrigerator. Do not freeze. Do not shake. Keep this medication in the original container. Protect from light. Use the dose within 30 minutes of removing it from the refrigerator. Get rid of any unused medication after the expiration date. To get rid of medications that are no longer needed or have expired: Take the medication to a medication take-back program. Check with your pharmacy or law enforcement to find a location. If you cannot return the medication, ask your pharmacist or care team how to get rid of this medication safely. NOTE: This sheet is a summary. It may not cover all possible information. If you have questions about this medicine, talk to your doctor, pharmacist, or health care provider.  2023 Elsevier/Gold Standard (2021-04-19 00:00:00)    Standing Labs We placed an order today for your standing lab work.   Please have your standing labs drawn in 1  month then every 3 months   Please have your labs drawn 2 weeks prior to your appointment so that the provider can discuss your lab results at your appointment.  Please note that you may see your imaging and lab results in [place] before we have reviewed them. We will contact you once all results are reviewed. Please allow our office up to 72 hours to thoroughly review all of the results before contacting the office for clarification of your results.  Lab hours are:   Monday through Thursday from 8:00 am -12:30 pm and 1:00 pm-5:00 pm and Friday from 8:00 am-12:00 pm.  Please be advised, all patients with office appointments requiring lab work will take precedent over walk-in lab work.   Labs are drawn by [facility]. Please bring your co-pay at the time of your lab draw.  You may receive a bill from [facility] for your lab work.  Please note if you are on Hydroxychloroquine and and an order has been placed for a Hydroxychloroquine level, you will need to have it drawn 4 hours or more after your last dose.  If you wish to have your labs drawn at another location, please call  the office 24 hours in advance so we can fax the orders.  The office is located at [address] [place], [place], [place] [address] No appointment is necessary.    If you have any questions regarding directions or hours of operation,  please call [phone].   As a reminder, please drink plenty of water prior to coming for your lab work. Thanks!  If you have signs or symptoms of an infection or start antibiotics: First, call your PCP for workup of your infection. Hold your medication through the infection, until you complete your antibiotics, and until symptoms resolve if you take the following: Injectable medication (Actemra, Benlysta, Cimzia, Cosentyx, Enbrel, Humira, Kevzara, Orencia, Remicade, Simponi, Stelara, Taltz, Tremfya) Methotrexate Leflunomide (Arava) Mycophenolate (Cellcept) Morrie Sheldon, Olumiant, or  Rinvoq Vaccines You are taking a medication(s) that can suppress your immune system.  The following immunizations are recommended: Flu annually Covid-19  Td/Tdap (tetanus, diphtheria, pertussis) every 10 years Pneumonia (Prevnar 15 then Pneumovax 23 at least 1 year apart.  Alternatively, can take Prevnar 20 without needing additional dose) Shingrix: 2 doses from 4 weeks to 6 months apart  Please check with your PCP to make sure you are up to date.

## 2022-04-10 ENCOUNTER — Telehealth: Payer: Self-pay | Admitting: Pharmacist

## 2022-04-10 ENCOUNTER — Other Ambulatory Visit (HOSPITAL_COMMUNITY): Payer: Self-pay

## 2022-04-10 DIAGNOSIS — L4050 Arthropathic psoriasis, unspecified: Secondary | ICD-10-CM

## 2022-04-10 DIAGNOSIS — L408 Other psoriasis: Secondary | ICD-10-CM

## 2022-04-10 DIAGNOSIS — Z79899 Other long term (current) drug therapy: Secondary | ICD-10-CM

## 2022-04-10 NOTE — Telephone Encounter (Addendum)
Submitted a Prior Authorization request to [facility] for TALTZ via CoverMyMeds. Will update once we receive a response.  Key: [id number]  Dose: 104m SQ every 28 days (skip loading dose per OV note from [name] PA-C)  Will need to update CBC/CMP at new start visit.  ----- Message from SShona Needles RT sent at 04/09/2022  2:27 PM EST ----- Regarding: NEW START TALTZ/[name]/CONSENTED

## 2022-04-11 ENCOUNTER — Other Ambulatory Visit (HOSPITAL_COMMUNITY): Payer: Self-pay

## 2022-04-13 ENCOUNTER — Other Ambulatory Visit (HOSPITAL_COMMUNITY): Payer: Self-pay

## 2022-04-16 ENCOUNTER — Other Ambulatory Visit (HOSPITAL_COMMUNITY): Payer: Self-pay

## 2022-04-18 ENCOUNTER — Other Ambulatory Visit (HOSPITAL_COMMUNITY): Payer: Self-pay

## 2022-04-18 NOTE — Telephone Encounter (Signed)
Do you have a list of the alternatives preferred over taltz?

## 2022-04-19 NOTE — Telephone Encounter (Signed)
I am concerned that Skyrizi/Stelara will not be as effective as an IL-17 inhibitor for controlling joint pain/stiffness/swelling.  She previously had an inadequate response to Cosentyx. I am hesitant to start the patient on a Jak inhibitor since she is wheelchair-bound (risk for DVT).  We are also concerned about aggressive immunosuppression given her history prosthesis infection requiring amputation.

## 2022-04-19 NOTE — Telephone Encounter (Signed)
Received a fax regarding Prior Authorization from [facility] for [place]. Authorization has been DENIED because one of the following must apply: (1) You need to try two (2) of these covered drugs: (a) Rinvoq*. (b) Skyrizi syringe or Skyrizi Pen*. (c) Stelara ([place])*. (d) Morrie Sheldon or Morrie Sheldon XR*. (2) Your doctor needs to give [place] specific medical reasons why two (2) of the covered drug(s) are not appropriate for you.  Knox Saliva, PharmD, MPH, BCPS, CPP Clinical Pharmacist (Rheumatology and Pulmonology)

## 2022-05-02 ENCOUNTER — Other Ambulatory Visit (HOSPITAL_COMMUNITY): Payer: Self-pay

## 2022-05-03 NOTE — Telephone Encounter (Signed)
Submitted an URGENT appeal to [facility] for TALTZ with IXORA-S and IXORA-R studies attached. Patient is not a good candidate for Rinvoq/Xeljanz because she is wheelchair bound.  Reference # [id number] Phone: [phone] Fax: [phone]  Knox Saliva, PharmD, MPH, BCPS, CPP Clinical Pharmacist (Rheumatology and Pulmonology)

## 2022-05-07 ENCOUNTER — Telehealth: Payer: Self-pay

## 2022-05-07 NOTE — Telephone Encounter (Signed)
[facility] contacted the office and left a message stating the patient's [name] has been approved. The call back number is [phone]. Please advise. Thanks.

## 2022-05-08 ENCOUNTER — Other Ambulatory Visit (HOSPITAL_COMMUNITY): Payer: Self-pay

## 2022-05-08 MED ORDER — TALTZ 80 MG/ML ~~LOC~~ SOAJ
80.0000 mg | SUBCUTANEOUS | 0 refills | Status: DC
  Filled 2022-05-08: qty 1, fill #0
  Filled 2022-05-09: qty 1, 28d supply, fill #0

## 2022-05-08 NOTE — Telephone Encounter (Signed)
We have not received approval letter from [employer] regarding patient's Taltz. Per test claim, copay is $0 so Donnetta Hail has been approved  Called patient to advise. New start visit is scheduled for 05/14/22. Rx sent to [facility]  Knox Saliva, PharmD, MPH, BCPS, CPP Clinical Pharmacist (Rheumatology and Pulmonology)

## 2022-05-08 NOTE — Telephone Encounter (Signed)
We have not received approval letter from [employer] regarding patient's Taltz. Per test claim, copay is $0 so Donnetta Hail has been approved  Called patient to advise. New start visit is scheduled for 05/14/22. Rx sent to [facility] to be couriered to clinic by 05/14/22  Knox Saliva, PharmD, MPH, BCPS, CPP Clinical Pharmacist (Rheumatology and Pulmonology)

## 2022-05-09 ENCOUNTER — Other Ambulatory Visit (HOSPITAL_COMMUNITY): Payer: Self-pay

## 2022-05-09 ENCOUNTER — Other Ambulatory Visit: Payer: Self-pay

## 2022-05-09 NOTE — Telephone Encounter (Signed)
Delivery instructions have been updated in [facility], medication will be couriered to [facility] on 05/10/22.  Rx has been processed in [facility] and the patient has no copay at this time.

## 2022-05-11 ENCOUNTER — Other Ambulatory Visit (HOSPITAL_COMMUNITY): Payer: Self-pay

## 2022-05-11 NOTE — Telephone Encounter (Signed)
Taltz received from [facility]. Place in refrigerator  Knox Saliva, PharmD, MPH, BCPS, CPP Clinical Pharmacist (Rheumatology and Pulmonology)

## 2022-05-14 ENCOUNTER — Other Ambulatory Visit (HOSPITAL_COMMUNITY): Payer: Self-pay

## 2022-05-14 ENCOUNTER — Other Ambulatory Visit: Payer: Self-pay

## 2022-05-14 ENCOUNTER — Ambulatory Visit: Payer: 59 | Attending: Internal Medicine | Admitting: Pharmacist

## 2022-05-14 DIAGNOSIS — Z79899 Other long term (current) drug therapy: Secondary | ICD-10-CM

## 2022-05-14 DIAGNOSIS — L4050 Arthropathic psoriasis, unspecified: Secondary | ICD-10-CM

## 2022-05-14 DIAGNOSIS — Z7189 Other specified counseling: Secondary | ICD-10-CM

## 2022-05-14 DIAGNOSIS — L408 Other psoriasis: Secondary | ICD-10-CM

## 2022-05-14 MED ORDER — TALTZ 80 MG/ML ~~LOC~~ SOAJ
80.0000 mg | SUBCUTANEOUS | 1 refills | Status: DC
  Filled 2022-05-14 – 2022-05-31 (×2): qty 1, 28d supply, fill #0
  Filled 2022-06-27: qty 1, 28d supply, fill #1

## 2022-05-14 NOTE — Progress Notes (Signed)
Pharmacy Note  Subjective:   Patient presents to clinic today to receive first dose of Taltz for psoriatic arthritis/psoriasis. She is currently not taking any maintenance medication   Patient running a fever or have signs/symptoms of infection? No  Patient currently on antibiotics for the treatment of infection? No  Patient have any upcoming invasive procedures/surgeries? No  Objective: CMP     Component Value Date/Time   NA 138 01/01/2022 1316   K 5.4 (H) 01/01/2022 1316   CL 102 01/01/2022 1316   CO2 30 01/01/2022 1316   GLUCOSE 167 (H) 01/01/2022 1316   BUN 23 01/01/2022 1316   CREATININE 1.00 01/01/2022 1316   CALCIUM 8.9 01/01/2022 1316   PROT 6.3 01/01/2022 1316   ALBUMIN 3.8 07/18/2016 1355   AST 43 (H) 01/01/2022 1316   ALT 48 (H) 01/01/2022 1316   ALKPHOS 70 07/18/2016 1355   BILITOT 0.3 01/01/2022 1316   GFRNONAA 63 08/17/2020 1454   GFRAA 73 08/17/2020 1454    CBC    Component Value Date/Time   WBC 9.0 01/01/2022 1316   RBC 4.77 01/01/2022 1316   HGB 13.6 01/01/2022 1316   HGB 13.1 10/20/2008 1415   HCT 40.9 01/01/2022 1316   HCT 39.0 10/20/2008 1415   PLT 242 01/01/2022 1316   PLT 320 10/20/2008 1415   MCV 85.7 01/01/2022 1316   MCV 92 10/20/2008 1415   MCH 28.5 01/01/2022 1316   MCHC 33.3 01/01/2022 1316   RDW 11.6 01/01/2022 1316   RDW 12.1 10/20/2008 1415   LYMPHSABS 2,475 01/01/2022 1316   LYMPHSABS 2.4 10/20/2008 1415   MONOABS 819 07/18/2016 1355   EOSABS 162 01/01/2022 1316   EOSABS 0.1 10/20/2008 1415   BASOSABS 27 01/01/2022 1316   BASOSABS 0.1 10/20/2008 1415    Baseline Immunosuppressant Therapy Labs TB GOLD    Latest Ref Rng & Units 10/02/2021    2:49 PM  Quantiferon TB Gold  Quantiferon TB Gold Plus NEGATIVE NEGATIVE    Hepatitis Panel    Latest Ref Rng & Units 11/16/2020    1:35 PM  Hepatitis  Hep B Surface Ag NON-REACTIVE NON-REACTIVE   Hep B IgM NON-REACTIVE NON-REACTIVE   Hepatitis panel re-drawn on 05/10/22 (per  CareEverywhere) - Hep B surface antigen - non reactive - Hep B surface antibody - reactive - Hep B core antibody total - reactive - Hepatitis A core antibody - non reactive - Hepatitis C antibody - non-reactive  Immunoglobulins    Latest Ref Rng & Units 11/16/2020    1:35 PM  Immunoglobulin Electrophoresis  IgA  70 - 320 mg/dL 559   IgG 600 - 1,540 mg/dL 947   IgM 50 - 300 mg/dL 83    SPEP    Latest Ref Rng & Units 01/01/2022    1:16 PM  Serum Protein Electrophoresis  Total Protein 6.1 - 8.1 g/dL 6.3    Chest x-ray: 06/14/21 (per CareEverywhere) - negative  Assessment/Plan:  Reviewed importance of holding TALTZ with signs/symptoms of an infections, if antibiotics are prescribed to treat an active infection, and with invasive procedures  Demonstrated proper injection technique with TALTZ demo device  Patient able to demonstrate proper injection technique using the teach back method.  Patient self injected in the left lower abdomen with:  [facility]-supplied Medication: TALTZ 80mg /mL autoinjector NDC: [id number]: [id number] CA Expiration: 01/05/2024  Patient tolerated well.  Observed for 30 mins in office for adverse reaction and none noted.  Patient denies itchiness and irritation  Patient is  to return in 1 month for labs and 6-8 weeks for follow-up appointment.  Standing orders for CBC/CMP placed.  TB gold will be monitored yearly.   TALTZ approved through insurance .   Rx sent to: [facility]: [phone] .  Patient provided with pharmacy phone number and advised that they will call to schedule refills to home moving forward  Patient will continue Taltz 80mg  subcut every 28 days as monotherapy. She will not be completing loading dose due to risk for immunosuppression.  All questions encouraged and answered.  Instructed patient to call with any further questions or concerns.  Knox Saliva, PharmD, MPH, BCPS, CPP Clinical Pharmacist  (Rheumatology and Pulmonology)  05/14/2022 7:57 AM

## 2022-05-14 NOTE — Patient Instructions (Signed)
Your next TALTZ dose is due on 06/11/22, 07/09/2022, and every 4 weeks thereafter  HOLD TALTZ if you have signs or symptoms of an infection. You can resume once you feel better or back to your baseline. HOLD TALTZ if you start antibiotics to treat an infection. HOLD TALTZ around the time of surgery/procedures. Your surgeon will be able to provide recommendations on when to hold BEFORE and when you are cleared to [facility].  Pharmacy information: Your prescription will be shipped from [facility]. Their phone number is [phone] They will call to schedule shipment and confirm address. They will mail your medication to your home.  Labs are due in 1 month then every 3 months. Lab hours are from Monday to Thursday 8am-12:30pm and 1pm-5pm and Friday 8am-12pm. You do not need an appointment if you come for labs during these times.  How to manage an injection site reaction: Remember the 5 C's: COUNTER - leave on the counter at least 30 minutes but up to overnight to bring medication to room temperature. This may help prevent stinging COLD - place something cold (like an ice gel pack or cold water bottle) on the injection site just before cleansing with alcohol. This may help reduce pain CLARITIN - use Claritin (generic name is loratadine) for the first two weeks of treatment or the day of, the day before, and the day after injecting. This will help to minimize injection site reactions CORTISONE CREAM - apply if injection site is irritated and itching CALL ME - if injection site reaction is bigger than the size of your fist, looks infected, blisters, or if you develop hives

## 2022-05-21 NOTE — Progress Notes (Signed)
Office Visit Note  Patient: [name]             Date of Birth: July 26, 1950           MRN: [id number]             PCP: Jamal Collin, PA-C Referring: Jamal Collin, PA-C Visit Date: 06/04/2022 Occupation: @GUAROCC @  Subjective:  Medication monitoring   History of Present Illness: [name] is a 72 y.o. female with history of psoriatic arthritis and fibromyalgia.  Patient is currently on Taltz 80 mg subcutaneous injections once monthly.  She was started on Toltz on 05/14/2022.  She has noticed about a 65% improvement in her joint pain and inflammation since switching from [employer] to [employer].  She has noticed 100% improvement in the skin clearance of psoriasis since switching to [employer].  She has been tolerating Taltz without any side effects or injection site reactions.  She denies any recent or recurrent infections.  She states that she has had some increased discomfort in her lower back but denies any SI joint pain at this time.  She experiences some stiffness in her hands but denies any increased joint swelling.  She continues to have generalized myalgias and muscle tenderness due to fibromyalgia. She remains on Prolia 60 mg subcu injections every 6 months for management of osteoporosis.   Activities of Daily Living:  Patient reports morning stiffness for 2-3 hours.   Patient Reports nocturnal pain.  Difficulty dressing/grooming: Reports Difficulty climbing stairs: Reports Difficulty getting out of chair: Reports Difficulty using hands for taps, buttons, cutlery, and/or writing: Reports  Review of Systems  Constitutional:  Negative for fatigue.  HENT:  Positive for mouth dryness. Negative for mouth sores.   Eyes:  Positive for dryness.  Respiratory:  Negative for shortness of breath.   Cardiovascular:  Negative for chest pain and palpitations.  Gastrointestinal:  Negative for blood in stool, constipation and diarrhea.  Endocrine: Negative for increased urination.   Genitourinary:  Negative for involuntary urination.  Musculoskeletal:  Positive for joint pain, joint pain, joint swelling, myalgias, muscle weakness, morning stiffness, muscle tenderness and myalgias.  Skin:  Negative for color change, rash, hair loss and sensitivity to sunlight.  Allergic/Immunologic: Positive for susceptible to infections.  Neurological:  Negative for dizziness and headaches.  Hematological:  Positive for swollen glands.  Psychiatric/Behavioral:  Positive for depressed mood and sleep disturbance. The patient is nervous/anxious.     PMFS History:  Patient Active Problem List   Diagnosis Date Noted   Dyslipidemia 10/16/2016   History of total knee replacement, bilateral 10/16/2016   Primary osteoarthritis of both hands 10/16/2016   History of diabetes mellitus 10/16/2016   Other chronic pain 10/16/2016   History of glaucoma 10/16/2016   Psoriatic arthropathy 07/06/2016   Other psoriasis 07/06/2016   High risk medications (not anticoagulants) long-term use 07/06/2016   Fibromyalgia 07/06/2016   Chronic fatigue 07/06/2016   Primary insomnia 07/06/2016    Past Medical History:  Diagnosis Date   Arthritis    Cataract    Diabetes mellitus without complication    Glaucoma    Hypertension    Vertigo     Family History  Problem Relation Age of Onset   Cancer Sister    Diabetes Sister    COPD Brother    Diabetes Brother    Past Surgical History:  Procedure Laterality Date   ABDOMINAL HYSTERECTOMY     APPENDECTOMY     CARPAL TUNNEL RELEASE  CHOLECYSTECTOMY     FEMUR FRACTURE SURGERY Right    FIBULA FRACTURE SURGERY Left 09/01/2018   LEG AMPUTATION Left 03/25/2020   REPLACEMENT TOTAL KNEE BILATERAL     TIBIA FRACTURE SURGERY Left 09/01/2018   TOTAL SHOULDER ARTHROPLASTY     TRIGGER FINGER RELEASE     TRIGGER FINGER RELEASE Right 02/2017   3rd and 4th digits.   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  Constitutional:      Appearance: She is well-developed.  HENT:     Head: Normocephalic and atraumatic.  Eyes:     Conjunctiva/sclera: Conjunctivae normal.  Cardiovascular:     Rate and Rhythm: Normal rate and regular rhythm.     Heart sounds: Normal heart sounds.  Pulmonary:     Effort: Pulmonary effort is normal.     Breath sounds: Normal breath sounds.  Abdominal:     General: Bowel sounds are normal.     Palpations: Abdomen is soft.  Musculoskeletal:     Cervical back: Normal range of motion.  Lymphadenopathy:     Cervical: No cervical adenopathy.  Skin:    General: Skin is warm and dry.     Capillary Refill: Capillary refill takes less than 2 seconds.  Neurological:     Mental Status: She is alert and oriented to person, place, and time.  Psychiatric:        Behavior: Behavior normal.      Musculoskeletal Exam: Patient remained in the wheelchair during the examination today.  C-spine had limited range of motion.  Thoracic kyphosis was noted.  Painful range of motion of both shoulder joints.  Elbow joints and wrist joints have good range of motion.  PIP and DIP thickening noted.  Right fourth PIP contracture.  Hip joints have good assess in seated position.  Right knee replacement has good range of motion.  Above-the-knee amputation of the left leg.  No evidence of Achilles tendinitis or plantar fasciitis of the right foot.  CDAI Exam: CDAI Score: -- Patient Global: --; Provider Global: -- Swollen: --; Tender: -- Joint Exam 06/04/2022   No joint exam has been documented for this visit   There is currently no  information documented on the homunculus. Go to the Rheumatology activity and complete the homunculus joint exam.  Investigation: No additional findings.  Imaging: No results found.  Recent Labs: Lab Results  Component Value Date   WBC 9.0 01/01/2022   HGB 13.6 01/01/2022   PLT 242 01/01/2022   NA 138 01/01/2022   K 5.4 (H) 01/01/2022   CL 102 01/01/2022   CO2 30 01/01/2022   GLUCOSE 167 (H) 01/01/2022   BUN 23 01/01/2022   CREATININE 1.00 01/01/2022   BILITOT 0.3 01/01/2022   ALKPHOS 70 07/18/2016   AST 43 (H) 01/01/2022   ALT 48 (H) 01/01/2022   PROT 6.3 01/01/2022   ALBUMIN 3.8 07/18/2016   CALCIUM 8.9 01/01/2022   GFRAA 73 08/17/2020   QFTBGOLDPLUS NEGATIVE 10/02/2021    Speciality Comments: [place] started 08/24/20  Procedures:  No procedures performed Allergies: Apremilast, Aspirin, Bee venom, Cephalosporins, Clarithromycin, Codeine, Demerol, E-mycin [erythromycin base], Etodolac, Gatifloxacin, Iodinated contrast media, Levaquin  [levofloxacin in d5w], Levofloxacin, Levofloxacin, Lyrica [pregabalin], Meperidine, Morphine and related, Nsaids, Other, Phenothiazines, Shellfish-derived products, Tequin, Penicillins, and Sulfa drugs cross reactors   Assessment / Plan:     Visit Diagnoses: Psoriatic arthropathy: She has no synovitis or dactylitis on examination today.  She has noticed a 65% improvement in her joint pain and inflammation since initiating Taltz on 05/14/2022.  She is tolerating Taltz without any side effects or injection site reactions.  She has not had any recent or recurrent infections.  She is not currently experiencing any SI joint pain and has no evidence of Achilles tendinitis or plantar fasciitis of the right foot.  Her psoriasis has been 100% cleared since initiating Taltz.  She will remain on Taltz 80 mg subcutaneous injections once monthly as monotherapy.  She is advised to notify us if she develops signs or symptoms of a flare.  She will follow-up in the  office in 3 months or sooner if needed.  Other psoriasis: She has noticed 100% skin clearance since initiating Taltz.  No active psoriasis was noted today.  High risk medication use - Taltz 80 mg sq injections--started 05/14/22. Previously on MTX (elevated LFTs in 2017), Remicade-reaction, Enbrel, humira, otezla, cosentyx,Allergy to sulfa,Otezla -difficulty swallowing. She was started on Taltz on 05/14/2022. TB gold negative on 10/02/21.  CBC and CMP updated on 05/07/22.  Orders for CBC and CMP released today.  Her next lab work will be due in July and every 3 months to monitor for drug toxicity. No recent or recurrent infections.  Discussed the importance of holding taltz if she develops signs or symptoms of an infection and to resume once the infection has completely cleared.   - Plan: COMPLETE METABOLIC PANEL WITH GFR, CBC with Differential/Platelet, QuantiFERON-TB Gold Plus  Screening for tuberculosis - Future order for TB gold released today. Plan: QuantiFERON-TB Gold Plus  Primary osteoarthritis of both hands: PIP and DIP thickening noted.  Complete fist formation noted.  No synovitis or dactylitis noted.  History of total right knee replacement: Doing well.  Good range of motion with no discomfort today.  Above knee amputation of left lower extremity: Patient remained in the wheelchair during examination today.  Age-related osteoporosis without current pathological fracture - DEXA 09/07/21: Lumbar spine BMD 1.1218 T-score 1.6.  LFN T-score -1.4, right forearm T-score -1.7.  She remains on Prolia 60 mg subcutaneous injections every 6 months.  Fibromyalgia: Generalized hyperalgesia and positive tender points on exam.  She continues to have generalized myalgias and muscle tenderness due to fibromyalgia.  Followed by pain management.  She remains on Cymbalta as prescribed. She has increased her activity regimen and has been going to chair yoga on a regular basis which she has found to be  helpful.  Primary insomnia: She experiences some difficulty sleeping at night due to nocturnal pain.  Contracture of hand joint, right: Right fourth PIP joint.  Unchanged.  No active inflammation.  History of fracture of tibia  History of fibula fracture  Other medical conditions are listed as follows:   Dyslipidemia  History of diabetes mellitus  Orders: Orders Placed This Encounter  Procedures   COMPLETE METABOLIC PANEL WITH GFR   CBC with Differential/Platelet   QuantiFERON-TB Gold Plus   No orders of the defined types were placed in this encounter.    Follow-Up Instructions: Return in about 3 months (around 09/03/2022)  for Psoriatic arthritis, Fibromyalgia.   Gearldine Bienenstock, PA-C  Note - This record has been created using Dragon software.  Chart creation errors have been sought, but may not always  have been located. Such creation errors do not reflect on  the standard of medical care.

## 2022-05-31 ENCOUNTER — Other Ambulatory Visit (HOSPITAL_COMMUNITY): Payer: Self-pay

## 2022-06-01 ENCOUNTER — Other Ambulatory Visit (HOSPITAL_COMMUNITY): Payer: Self-pay

## 2022-06-04 ENCOUNTER — Encounter: Payer: Self-pay | Admitting: Physician Assistant

## 2022-06-04 ENCOUNTER — Ambulatory Visit: Payer: PRIVATE HEALTH INSURANCE | Attending: Physician Assistant | Admitting: Physician Assistant

## 2022-06-04 VITALS — BP 115/67 | HR 62 | Resp 17 | Ht 66.5 in | Wt 230.0 lb

## 2022-06-04 DIAGNOSIS — L408 Other psoriasis: Secondary | ICD-10-CM | POA: Diagnosis not present

## 2022-06-04 DIAGNOSIS — M19041 Primary osteoarthritis, right hand: Secondary | ICD-10-CM

## 2022-06-04 DIAGNOSIS — Z8781 Personal history of (healed) traumatic fracture: Secondary | ICD-10-CM

## 2022-06-04 DIAGNOSIS — Z111 Encounter for screening for respiratory tuberculosis: Secondary | ICD-10-CM

## 2022-06-04 DIAGNOSIS — L4050 Arthropathic psoriasis, unspecified: Secondary | ICD-10-CM | POA: Diagnosis not present

## 2022-06-04 DIAGNOSIS — Z8639 Personal history of other endocrine, nutritional and metabolic disease: Secondary | ICD-10-CM

## 2022-06-04 DIAGNOSIS — Z79899 Other long term (current) drug therapy: Secondary | ICD-10-CM | POA: Diagnosis not present

## 2022-06-04 DIAGNOSIS — Z96651 Presence of right artificial knee joint: Secondary | ICD-10-CM

## 2022-06-04 DIAGNOSIS — E785 Hyperlipidemia, unspecified: Secondary | ICD-10-CM

## 2022-06-04 DIAGNOSIS — F5101 Primary insomnia: Secondary | ICD-10-CM

## 2022-06-04 DIAGNOSIS — S78112A Complete traumatic amputation at level between left hip and knee, initial encounter: Secondary | ICD-10-CM

## 2022-06-04 DIAGNOSIS — M19042 Primary osteoarthritis, left hand: Secondary | ICD-10-CM

## 2022-06-04 DIAGNOSIS — M797 Fibromyalgia: Secondary | ICD-10-CM

## 2022-06-04 DIAGNOSIS — M24541 Contracture, right hand: Secondary | ICD-10-CM

## 2022-06-04 DIAGNOSIS — M810 Age-related osteoporosis without current pathological fracture: Secondary | ICD-10-CM

## 2022-06-05 LAB — COMPLETE METABOLIC PANEL WITH GFR
AG Ratio: 1.3 (calc) (ref 1.0–2.5)
ALT: 15 U/L (ref 6–29)
AST: 12 U/L (ref 10–35)
Albumin: 3.6 g/dL (ref 3.6–5.1)
Alkaline phosphatase (APISO): 72 U/L (ref 37–153)
BUN: 20 mg/dL (ref 7–25)
CO2: 32 mmol/L (ref 20–32)
Calcium: 9.2 mg/dL (ref 8.6–10.4)
Chloride: 101 mmol/L (ref 98–110)
Creat: 1 mg/dL (ref 0.60–1.00)
Globulin: 2.7 g/dL (calc) (ref 1.9–3.7)
Glucose, Bld: 111 mg/dL (ref 65–139)
Potassium: 5.1 mmol/L (ref 3.5–5.3)
Sodium: 139 mmol/L (ref 135–146)
Total Bilirubin: 0.4 mg/dL (ref 0.2–1.2)
Total Protein: 6.3 g/dL (ref 6.1–8.1)
eGFR: 60 mL/min/{1.73_m2} (ref >=60)

## 2022-06-05 LAB — CBC WITH DIFFERENTIAL/PLATELET
Absolute Monocytes: 583 cells/uL (ref 200–950)
Basophils Absolute: 17 cells/uL (ref 0–200)
Basophils Relative: 0.2 %
Eosinophils Absolute: 157 cells/uL (ref 15–500)
Eosinophils Relative: 1.8 %
HCT: 42.4 % (ref 35.0–45.0)
Hemoglobin: 13.7 g/dL (ref 11.7–15.5)
Lymphs Abs: 2880 cells/uL (ref 850–3900)
MCH: 27.7 pg (ref 27.0–33.0)
MCHC: 32.3 g/dL (ref 32.0–36.0)
MCV: 85.7 fL (ref 80.0–100.0)
MPV: 10.9 fL (ref 7.5–12.5)
Monocytes Relative: 6.7 %
Neutro Abs: 5063 cells/uL (ref 1500–7800)
Neutrophils Relative %: 58.2 %
Platelets: 211 10*3/uL (ref 140–400)
RBC: 4.95 10*6/uL (ref 3.80–5.10)
RDW: 12.3 % (ref 11.0–15.0)
Total Lymphocyte: 33.1 %
WBC: 8.7 10*3/uL (ref 3.8–10.8)

## 2022-06-05 NOTE — Progress Notes (Signed)
CBC and CMP WNL

## 2022-06-27 ENCOUNTER — Other Ambulatory Visit (HOSPITAL_COMMUNITY): Payer: Self-pay

## 2022-07-02 ENCOUNTER — Other Ambulatory Visit (HOSPITAL_COMMUNITY): Payer: Self-pay

## 2022-07-27 DIAGNOSIS — L408 Other psoriasis: Secondary | ICD-10-CM

## 2022-07-27 DIAGNOSIS — Z79899 Other long term (current) drug therapy: Secondary | ICD-10-CM

## 2022-07-27 DIAGNOSIS — L4050 Arthropathic psoriasis, unspecified: Secondary | ICD-10-CM

## 2022-07-27 NOTE — Telephone Encounter (Signed)
Last Fill: 05/14/2022  Labs: 06/04/2022 CBC and CMP WNL   TB Gold: 10/02/2021 negative    Next Visit: 09/03/2022  Last Visit: 06/04/2022  [id number] arthropathy   Current Dose per office note on 06/04/2022: Taltz 80 mg subcutaneous injections once monthly   Okay to refill Taltz?

## 2022-07-30 ENCOUNTER — Other Ambulatory Visit: Payer: Self-pay

## 2022-07-30 ENCOUNTER — Other Ambulatory Visit (HOSPITAL_COMMUNITY): Payer: Self-pay

## 2022-07-30 MED ORDER — TALTZ 80 MG/ML ~~LOC~~ SOAJ
80.0000 mg | SUBCUTANEOUS | 2 refills | Status: DC
  Filled 2022-07-30 (×2): qty 1, 28d supply, fill #0
  Filled 2022-08-29: qty 1, 28d supply, fill #1
  Filled 2022-10-03: qty 1, 28d supply, fill #2

## 2022-08-08 ENCOUNTER — Encounter (HOSPITAL_COMMUNITY): Payer: Self-pay | Admitting: Oral Surgery

## 2022-08-08 ENCOUNTER — Other Ambulatory Visit (HOSPITAL_COMMUNITY): Payer: Self-pay

## 2022-08-08 ENCOUNTER — Other Ambulatory Visit: Payer: Self-pay

## 2022-08-08 NOTE — Progress Notes (Signed)
SDW call  Patient was given pre-op instructions over the phone. Patient verbalized understanding of instructions provided.    PCP - Luella Cook Hedgcock PA-C, [facility] Cardiologist - denies Pulmonary:    PPM/ICD - denies  Chest x-ray - n/a EKG -  DOS 6/21/02024 Stress Test - ECHO - 09/2016 Cardiac Cath -   Sleep Study/sleep apnea/CPAP: denies  Type II diabetic, Uses freestyle libre 3 Fasting Blood sugar range: 89-200 How often check sugars: continuous Insulin NPH regular 70/30.  Evening meal before surgery take 42 units (70% of regular dose). Zero units day of surgery   Blood Thinner Instructions: denies Aspirin Instructions:denies   ERAS Protcol - No, NPO PRE-SURGERY Ensure or G2-    COVID TEST- n/a    Anesthesia review: Yes. HTN, DM, anesthesia complications   Patient denies shortness of breath, fever, cough and chest pain over the phone call  Your procedure is scheduled on Friday August 10, 2022  Report to [facility] Main Entrance "A" at 0800  A.M., then check in with the Admitting office.  Call this number if you have problems the morning of surgery:  [phone]   If you have any questions prior to your surgery date call [phone]: Open Monday-Friday 8am-4pm If you experience any cold or flu symptoms such as cough, fever, chills, shortness of breath, etc. between now and your scheduled surgery, please notify us at the above number    Remember:  Do not eat or drink after midnight the night before your surgery  Take these medicines the morning of surgery with A SIP OF WATER:  Belbuca, buspar, zyrtec, cymbalta, zetia, gabapentin, robaxin, metoprolol, omeprazole, crestor  As needed: Flonase, advair, meclizine, zofran, oxycodone, refresh eye drops  As of today, STOP taking any Aspirin (unless otherwise instructed by your surgeon) Aleve, Naproxen, Ibuprofen, Motrin, Advil, Goody's, BC's, all herbal medications, fish oil, and all vitamins.

## 2022-08-08 NOTE — H&P (Signed)
  Patient: [name]  PID: [id number]  DOB: 1950/12/15  SEX: Female   Patient referred by DDS for extraction all remaining teeth  CC: Bad teeth  Past Medical History:  Heart Murmur, High Blood Pressure, Asthma, Hay Fever or Sinus Problems, Thyroid trouble, Diabetes, Low Blood Sugar, Kidney Trouble, Arthritis, Osteoporosis,  Eye Disease or Glaucoma, Obese    Medications: Insulin, Buspar, Cymbalta, Neurontin, Metoprolol, Lisinopril, Zetia, Oxycodone, Prolia, Belbuca, Norvasc    Allergies:     Penicillin, Vancomycin, Iodine, Tequin, levaquin, Sulfa, Sulfites, Clindamycin, Cephalosporins, Demerol, Darvocet    Surgeries:   leg amputation 2022    Social History       Smoking:   n         Alcohol:n Drug use:n                             Exam: [id number]. Gross decay and gingival recession teeth # 3, 6, 8, 9, 10, 11, 12, 13, 14, 15, 19, 21, 22, 23, 24, 25, 26, 27, 28, 30.  No purulence, edema, fluctuance, trismus. Oral cancer screening negative. Pharynx clear. No lymphadenopathy.  Panorex:Gross decay teeth  # 3, 6, 8, 9, 10, 11, 12, 13, 14, 15, 19, 21, 22, 23, 24, 25, 26, 27, 28, 30.   Assessment: ASA . Non-restorable teeth  # 3, 6, 8, 9, 10, 11, 12, 13, 14, 15, 19, 21, 22, 23, 24, 25, 26, 27, 28, 30              Plan: 1. MD Clearance  2.  Extraction Teeth #3, 6, 8, 9, 10, 11, 12, 13, 14, 15, 19, 21, 22, 23, 24, 25, 26, 27, 28, 30. Alveoloplasty.  Hospital Day surgery.                 Rx: n               Risks and complications explained. Questions answered.   Georgia Lopes, DMD

## 2022-08-08 NOTE — Anesthesia Preprocedure Evaluation (Addendum)
Anesthesia Evaluation  Patient identified by MRN, date of birth, ID band Patient awake    Reviewed: Allergy & Precautions, NPO status , Patient's Chart, lab work & pertinent test results  Airway Mallampati: III  TM Distance: >3 FB Neck ROM: Full    Dental  (+) Dental Advisory Given   Pulmonary neg pulmonary ROS   breath sounds clear to auscultation       Cardiovascular hypertension, Pt. on medications  Rhythm:Regular Rate:Normal     Neuro/Psych  Neuromuscular disease    GI/Hepatic negative GI ROS, Neg liver ROS,,,  Endo/Other  diabetes, Type 2, Insulin Dependent    Renal/GU negative Renal ROS     Musculoskeletal  (+) Arthritis ,  Fibromyalgia -  Abdominal   Peds  Hematology negative hematology ROS (+)   Anesthesia Other Findings   Reproductive/Obstetrics                             Anesthesia Physical Anesthesia Plan  ASA: 3  Anesthesia Plan: General   Post-op Pain Management: Minimal or no pain anticipated   Induction: Intravenous  PONV Risk Score and Plan: 3 and Dexamethasone and Treatment may vary due to age or medical condition  Airway Management Planned: Nasal ETT  Additional Equipment: None  Intra-op Plan:   Post-operative Plan: Extubation in OR  Informed Consent: I have reviewed the patients History and Physical, chart, labs and discussed the procedure including the risks, benefits and alternatives for the proposed anesthesia with the patient or authorized representative who has indicated his/her understanding and acceptance.     Dental advisory given  Plan Discussed with: CRNA  Anesthesia Plan Comments: (  )       Anesthesia Quick Evaluation

## 2022-08-08 NOTE — Progress Notes (Signed)
Anesthesia Chart Review: [name]  Case: [id number] Date/Time: 08/10/22 1016   Procedure: DENTAL RESTORATION/EXTRACTIONS   Anesthesia type: General   Pre-op diagnosis: DENTAL CARRIES   Location: [facility] ROOM 04 / [facility]   Surgeons: Ocie Doyne, DMD       DISCUSSION: Patient is a 72 year old female scheduled for the above procedure.  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  She is on oxycodone 10 mg 4 times daily as needed, Belbuca 900 mcg daily, Neurontin 1200 mg 3 times daily.  She is a same day work-up. She is for updated labs and EKg on arrival as indicated. 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 Negative stress test in 2018. July 2021 echo showed LVEF 60-65%, normal LV/RV function, no significant valvular disease.   Anesthesia team to evaluate on the day of surgery.   VS: Ht 5\' 6"  (1.676 m)   Wt 106.1 kg   BMI 37.77 kg/m  BP Readings from Last 3 Encounters:  06/04/22 115/67  04/09/22 (!) 92/53  01/01/22 110/63   Pulse Readings from Last 3 Encounters:  06/04/22 62  04/09/22 62  01/01/22 67     PROVIDERS: Jamal Collin, PA-C is PCP  Izell Crockett, MD is endocrinologist ([facility]) - She is not followed routinely by cardiology,  but had non-ischemic stress test for chest pain evaluation per Ginger Carne, MD in 2018. - Pain Management through [facility]. Last visit 07/02/22 with Marzetta Board, Cordelia Poche - Sheppard Evens is rheumatology provider   LABS: For day of surgery as indicated. Labs on 05/07/22 showed  TSH 1.241,  BUN 22, Creatinine 1.16, eGFR 50, glucose 80, alk phos 132, AST 105, ALT 45. Non-reactive acute hepatitis screen 06/18/22 with reactive Hep B Core Ab and non-reactive Hep B Surface antigen on 05/10/22. Repeat CMP on 06/04/22 showed normal LFTs, Creatinine 1.00, glucose 111, and CBC showed H/H 13.7/42.4, PLT 211. A1c 8.0% 12/25/21.    IMAGES: [place] Abd 05/16/22 ([facility]): 1. The common bile duct is dilated to 1.5 cm. This is likely due to  the patient's prior cholecystectomy. Recommend correlation with  labs. If there is concern for biliary obstruction, recommend MRCP.  2. No other abnormalities.    EKG: For day of surgery. 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    CV: Echo 09/09/19 ([facility]): SUMMARY  The left ventricular size is normal.  There is normal left ventricular wall thickness.  LV ejection fraction = 60-65%.  Grade I diastolic dysfunction  Left ventricular systolic function is normal.  The left ventricular wall motion is normal.  The right ventricle is mildly dilated.  The right ventricular systolic function is normal.  The left atrium is mildly dilated.  No significant aortic valve stenosis  There is no significant valvular stenosis or regurgitation.  There was insufficient TR detected to calculate RV systolic pressure.  There is no comparison study available.    Nuclear stress test 10/09/16 ([facility]):  Summary   1. Fair quality study.   2. Normal Lexiscan stress EKG.   3. Normal myocardial perfusion study with no evidence of ischemia or   infarct.   4. Normal LV systolic function, EF 84%.     Past Medical History:  Diagnosis Date   Arthritis    Cataract    Complication of anesthesia    AKI post left TKA, required dialysis x 3 week, 2005   Diabetes mellitus without complication (HCC)    Glaucoma    Hypertension    Psoriatic arthritis (HCC)    Vertigo     Past Surgical History:  Procedure Laterality Date   ABDOMINAL HYSTERECTOMY     APPENDECTOMY     CARPAL TUNNEL RELEASE     cataract surgery Bilateral    CHOLECYSTECTOMY     FEMUR FRACTURE SURGERY Right    FIBULA FRACTURE SURGERY Left 09/01/2018   LEG AMPUTATION Left 03/25/2020   REPLACEMENT TOTAL KNEE BILATERAL     TIBIA FRACTURE SURGERY Left 09/01/2018   TOTAL SHOULDER ARTHROPLASTY     TRIGGER FINGER RELEASE     TRIGGER FINGER RELEASE Right 02/2017   3rd and 4th digits.    MEDICATIONS: No current facility-administered medications for this encounter.    BELBUCA 900 MCG FILM   busPIRone (BUSPAR) 30 MG tablet   Calcium Carb-Cholecalciferol (CALCIUM + VITAMIN D3 PO)   cetirizine (ZYRTEC) 10 MG tablet   Cholecalciferol (VITAMIN D3) 1000 units CAPS   clobetasol cream (TEMOVATE) 0.05 %   denosumab (PROLIA) 60 MG/ML SOSY injection   diclofenac sodium (VOLTAREN) 1 % GEL   DULoxetine (CYMBALTA) 30 MG capsule   ezetimibe (ZETIA) 10 MG tablet   fluticasone (FLONASE) 50 MCG/ACT nasal spray   Fluticasone-Salmeterol (ADVAIR) 250-50 MCG/DOSE AEPB   folic acid (FOLVITE) 1 MG tablet   furosemide (LASIX) 20 MG tablet   gabapentin (NEURONTIN) 400 MG capsule   hydrocortisone 2.5 % cream   insulin NPH-regular Human (70-30) 100 UNIT/ML injection   Ixekizumab (TALTZ) 80 MG/ML SOAJ   lisinopril (ZESTRIL) 20 MG tablet   meclizine (ANTIVERT) 25 MG tablet   methocarbamol (ROBAXIN) 500 MG tablet   metoprolol tartrate (LOPRESSOR) 25 MG tablet   omeprazole (PRILOSEC) 40 MG capsule   ondansetron (ZOFRAN) 4 MG tablet   ondansetron (ZOFRAN-ODT) 8 MG disintegrating tablet   Oxycodone HCl 10 MG TABS   oxymetazoline (AFRIN)  0.05 % nasal spray   Polyvinyl Alcohol-Povidone (REFRESH OP)   rosuvastatin (CRESTOR) 10 MG tablet   Continuous Blood Gluc Sensor (FREESTYLE LIBRE 3 SENSOR) MISC   Insulin Syringe-Needle U-100 (INSULIN SYRINGE .3CC/31GX5/16") 31G X 5/16" 0.3 ML MISC   Insulin Syringe-Needle U-100 31G X 5/16" 1 ML MISC    Shonna Chock, PA-C Surgical Short Stay/Anesthesiology [facility] Phone [phone] [facility] Phone [phone] 08/08/2022 7:33 PM

## 2022-08-10 ENCOUNTER — Ambulatory Visit (HOSPITAL_COMMUNITY): Payer: 59 | Admitting: Vascular Surgery

## 2022-08-10 ENCOUNTER — Ambulatory Visit (HOSPITAL_BASED_OUTPATIENT_CLINIC_OR_DEPARTMENT_OTHER): Payer: 59 | Admitting: Vascular Surgery

## 2022-08-10 ENCOUNTER — Encounter (HOSPITAL_COMMUNITY): Payer: Self-pay | Admitting: Oral Surgery

## 2022-08-10 ENCOUNTER — Other Ambulatory Visit: Payer: Self-pay

## 2022-08-10 ENCOUNTER — Encounter (HOSPITAL_COMMUNITY)
Admission: RE | Disposition: A | Discharge status: Home / Self Care | Payer: Self-pay | Source: Home / Self Care | Attending: Oral Surgery

## 2022-08-10 ENCOUNTER — Ambulatory Visit (HOSPITAL_COMMUNITY)
Admission: RE | Admit: 2022-08-10 | Discharge: 2022-08-10 | Disposition: A | Discharge status: Home / Self Care | Payer: 59 | Attending: Oral Surgery | Admitting: Oral Surgery

## 2022-08-10 DIAGNOSIS — Z79899 Other long term (current) drug therapy: Secondary | ICD-10-CM | POA: Diagnosis not present

## 2022-08-10 DIAGNOSIS — K06020 Generalized gingival recession, unspecified: Secondary | ICD-10-CM | POA: Diagnosis not present

## 2022-08-10 DIAGNOSIS — E119 Type 2 diabetes mellitus without complications: Secondary | ICD-10-CM | POA: Insufficient documentation

## 2022-08-10 DIAGNOSIS — M1990 Unspecified osteoarthritis, unspecified site: Secondary | ICD-10-CM | POA: Insufficient documentation

## 2022-08-10 DIAGNOSIS — I10 Essential (primary) hypertension: Secondary | ICD-10-CM | POA: Diagnosis not present

## 2022-08-10 DIAGNOSIS — K029 Dental caries, unspecified: Secondary | ICD-10-CM | POA: Diagnosis present

## 2022-08-10 DIAGNOSIS — Z794 Long term (current) use of insulin: Secondary | ICD-10-CM | POA: Insufficient documentation

## 2022-08-10 DIAGNOSIS — M797 Fibromyalgia: Secondary | ICD-10-CM | POA: Diagnosis not present

## 2022-08-10 HISTORY — DX: Arthropathic psoriasis, unspecified: L40.50

## 2022-08-10 HISTORY — DX: Other complications of anesthesia, initial encounter: T88.59XA

## 2022-08-10 HISTORY — PX: TOOTH EXTRACTION: SHX859

## 2022-08-10 LAB — BASIC METABOLIC PANEL
Anion gap: 7 (ref 5–15)
BUN: 25 mg/dL — ABNORMAL HIGH (ref 8–23)
CO2: 26 mmol/L (ref 22–32)
Calcium: 8.6 mg/dL — ABNORMAL LOW (ref 8.9–10.3)
Chloride: 102 mmol/L (ref 98–111)
Creatinine, Ser: 1.17 mg/dL — ABNORMAL HIGH (ref 0.44–1.00)
GFR, Estimated: 50 mL/min — ABNORMAL LOW (ref >60)
Glucose, Bld: 76 mg/dL (ref 70–99)
Potassium: 4.6 mmol/L (ref 3.5–5.1)
Sodium: 135 mmol/L (ref 135–145)

## 2022-08-10 LAB — CBC
HCT: 42.4 % (ref 36.0–46.0)
Hemoglobin: 13.4 g/dL (ref 12.0–15.0)
MCH: 28 pg (ref 26.0–34.0)
MCHC: 31.6 g/dL (ref 30.0–36.0)
MCV: 88.7 fL (ref 80.0–100.0)
Platelets: 227 10*3/uL (ref 150–400)
RBC: 4.78 MIL/uL (ref 3.87–5.11)
RDW: 13.1 % (ref 11.5–15.5)
WBC: 6.9 10*3/uL (ref 4.0–10.5)
nRBC: 0 % (ref 0.0–0.2)

## 2022-08-10 LAB — GLUCOSE, CAPILLARY
Glucose-Capillary: 82 mg/dL (ref 70–99)
Glucose-Capillary: 133 mg/dL — ABNORMAL HIGH (ref 70–99)

## 2022-08-10 SURGERY — DENTAL RESTORATION/EXTRACTIONS
Anesthesia: General | Site: Mouth

## 2022-08-10 MED ORDER — 0.9 % SODIUM CHLORIDE (POUR BTL) OPTIME
TOPICAL | Status: DC | PRN
  Administered 2022-08-10: 1000 mL

## 2022-08-10 MED ORDER — ONDANSETRON HCL 4 MG/2ML IJ SOLN
INTRAMUSCULAR | Status: DC | PRN
  Administered 2022-08-10: 4 mg via INTRAVENOUS

## 2022-08-10 MED ORDER — CHLORHEXIDINE GLUCONATE 0.12 % MT SOLN
OROMUCOSAL | Status: AC
  Administered 2022-08-10: 15 mL via OROMUCOSAL
  Filled 2022-08-10: qty 15

## 2022-08-10 MED ORDER — ROCURONIUM BROMIDE 10 MG/ML (PF) SYRINGE
PREFILLED_SYRINGE | INTRAVENOUS | Status: DC | PRN
  Administered 2022-08-10: 60 mg via INTRAVENOUS

## 2022-08-10 MED ORDER — METRONIDAZOLE 500 MG/100ML IV SOLN
500.0000 mg | INTRAVENOUS | Status: AC
  Administered 2022-08-10: 500 mg via INTRAVENOUS
  Filled 2022-08-10: qty 100

## 2022-08-10 MED ORDER — PROPOFOL 10 MG/ML IV BOLUS
INTRAVENOUS | Status: DC | PRN
  Administered 2022-08-10: 200 mg via INTRAVENOUS

## 2022-08-10 MED ORDER — LIDOCAINE 2% (20 MG/ML) 5 ML SYRINGE
INTRAMUSCULAR | Status: AC
  Filled 2022-08-10: qty 5

## 2022-08-10 MED ORDER — ONDANSETRON HCL 4 MG/2ML IJ SOLN
INTRAMUSCULAR | Status: AC
  Filled 2022-08-10: qty 2

## 2022-08-10 MED ORDER — FENTANYL CITRATE (PF) 100 MCG/2ML IJ SOLN
25.0000 ug | INTRAMUSCULAR | Status: DC | PRN
  Administered 2022-08-10: 25 ug via INTRAVENOUS

## 2022-08-10 MED ORDER — LACTATED RINGERS IV SOLN: INTRAVENOUS | Status: DC

## 2022-08-10 MED ORDER — DEXAMETHASONE SODIUM PHOSPHATE 10 MG/ML IJ SOLN
INTRAMUSCULAR | Status: DC | PRN
  Administered 2022-08-10: 5 mg via INTRAVENOUS

## 2022-08-10 MED ORDER — OXYMETAZOLINE HCL 0.05 % NA SOLN
NASAL | Status: AC
  Filled 2022-08-10: qty 30

## 2022-08-10 MED ORDER — MIDAZOLAM HCL 2 MG/2ML IJ SOLN
INTRAMUSCULAR | Status: DC | PRN
  Administered 2022-08-10: 2 mg via INTRAVENOUS

## 2022-08-10 MED ORDER — EPHEDRINE SULFATE-NACL 50-0.9 MG/10ML-% IV SOSY
PREFILLED_SYRINGE | INTRAVENOUS | Status: DC | PRN
  Administered 2022-08-10: 10 mg via INTRAVENOUS
  Administered 2022-08-10: 5 mg via INTRAVENOUS

## 2022-08-10 MED ORDER — FENTANYL CITRATE (PF) 100 MCG/2ML IJ SOLN
INTRAMUSCULAR | Status: AC
  Filled 2022-08-10: qty 2

## 2022-08-10 MED ORDER — AMISULPRIDE (ANTIEMETIC) 5 MG/2ML IV SOLN: 10.0000 mg | Freq: Once | INTRAVENOUS | Status: DC | PRN

## 2022-08-10 MED ORDER — LIDOCAINE-EPINEPHRINE 2 %-1:100000 IJ SOLN
INTRAMUSCULAR | Status: DC | PRN
  Administered 2022-08-10: 20 mL

## 2022-08-10 MED ORDER — KETOROLAC TROMETHAMINE 30 MG/ML IJ SOLN
INTRAMUSCULAR | Status: AC
  Filled 2022-08-10: qty 1

## 2022-08-10 MED ORDER — FENTANYL CITRATE (PF) 250 MCG/5ML IJ SOLN
INTRAMUSCULAR | Status: DC | PRN
  Administered 2022-08-10 (×3): 50 ug via INTRAVENOUS
  Administered 2022-08-10: 100 ug via INTRAVENOUS

## 2022-08-10 MED ORDER — ROCURONIUM BROMIDE 10 MG/ML (PF) SYRINGE
PREFILLED_SYRINGE | INTRAVENOUS | Status: AC
  Filled 2022-08-10: qty 10

## 2022-08-10 MED ORDER — LIDOCAINE 2% (20 MG/ML) 5 ML SYRINGE
INTRAMUSCULAR | Status: DC | PRN
  Administered 2022-08-10: 60 mg via INTRAVENOUS

## 2022-08-10 MED ORDER — LIDOCAINE-EPINEPHRINE 2 %-1:100000 IJ SOLN
INTRAMUSCULAR | Status: AC
  Filled 2022-08-10: qty 1

## 2022-08-10 MED ORDER — INSULIN ASPART 100 UNIT/ML IJ SOLN: 0.0000 [IU] | INTRAMUSCULAR | Status: DC | PRN

## 2022-08-10 MED ORDER — ORAL CARE MOUTH RINSE: 15.0000 mL | Freq: Once | OROMUCOSAL | Status: AC

## 2022-08-10 MED ORDER — DEXAMETHASONE SODIUM PHOSPHATE 10 MG/ML IJ SOLN
INTRAMUSCULAR | Status: AC
  Filled 2022-08-10: qty 1

## 2022-08-10 MED ORDER — METRONIDAZOLE 500 MG PO TABS: 500.0000 mg | ORAL_TABLET | Freq: Three times a day (TID) | ORAL | 0 refills | Status: AC

## 2022-08-10 MED ORDER — PROPOFOL 10 MG/ML IV BOLUS
INTRAVENOUS | Status: AC
  Filled 2022-08-10: qty 20

## 2022-08-10 MED ORDER — ACETAMINOPHEN 500 MG PO TABS: 1000.0000 mg | ORAL_TABLET | Freq: Once | ORAL | Status: DC

## 2022-08-10 MED ORDER — FENTANYL CITRATE (PF) 250 MCG/5ML IJ SOLN
INTRAMUSCULAR | Status: AC
  Filled 2022-08-10: qty 5

## 2022-08-10 MED ORDER — OXYCODONE HCL 5 MG PO TABS: 5.0000 mg | ORAL_TABLET | ORAL | 0 refills | Status: AC | PRN

## 2022-08-10 MED ORDER — PHENYLEPHRINE 80 MCG/ML (10ML) SYRINGE FOR IV PUSH (FOR BLOOD PRESSURE SUPPORT)
PREFILLED_SYRINGE | INTRAVENOUS | Status: DC | PRN
  Administered 2022-08-10: 160 ug via INTRAVENOUS
  Administered 2022-08-10: 80 ug via INTRAVENOUS
  Administered 2022-08-10: 160 ug via INTRAVENOUS
  Administered 2022-08-10 (×2): 80 ug via INTRAVENOUS

## 2022-08-10 MED ORDER — CHLORHEXIDINE GLUCONATE 0.12 % MT SOLN: 15.0000 mL | Freq: Once | OROMUCOSAL | Status: AC

## 2022-08-10 MED ORDER — SODIUM CHLORIDE 0.9 % IR SOLN
Status: DC | PRN
  Administered 2022-08-10: 1000 mL

## 2022-08-10 MED ORDER — PHENYLEPHRINE HCL-NACL 20-0.9 MG/250ML-% IV SOLN
INTRAVENOUS | Status: DC | PRN
  Administered 2022-08-10: 60 ug/min via INTRAVENOUS

## 2022-08-10 MED ORDER — SUGAMMADEX SODIUM 200 MG/2ML IV SOLN
INTRAVENOUS | Status: DC | PRN
  Administered 2022-08-10: 200 mg via INTRAVENOUS

## 2022-08-10 MED ORDER — MIDAZOLAM HCL 2 MG/2ML IJ SOLN
INTRAMUSCULAR | Status: AC
  Filled 2022-08-10: qty 2

## 2022-08-10 MED ORDER — ALBUMIN HUMAN 5 % IV SOLN: INTRAVENOUS | Status: DC | PRN

## 2022-08-10 SURGICAL SUPPLY — 36 items
BAG COUNTER SPONGE SURGICOUNT (BAG) IMPLANT
BAG SPNG CNTER NS LX DISP (BAG)
BLADE SURG 15 STRL LF DISP TIS (BLADE) ×1 IMPLANT
BLADE SURG 15 STRL SS (BLADE) ×1
BUR CROSS CUT FISSURE 1.6 (BURR) ×1 IMPLANT
BUR EGG ELITE 4.0 (BURR) ×1 IMPLANT
BUR SABER RD CUTTING 3.0 (BURR) IMPLANT
CANISTER SUCT 3000ML PPV (MISCELLANEOUS) ×1 IMPLANT
COVER SURGICAL LIGHT HANDLE (MISCELLANEOUS) ×1 IMPLANT
GAUZE PACKING FOLDED 2 STR (GAUZE/BANDAGES/DRESSINGS) ×1 IMPLANT
GLOVE BIO SURGEON STRL SZ 6.5 (GLOVE) IMPLANT
GLOVE BIO SURGEON STRL SZ7 (GLOVE) IMPLANT
GLOVE BIO SURGEON STRL SZ8 (GLOVE) ×1 IMPLANT
GLOVE BIOGEL PI IND STRL 6.5 (GLOVE) IMPLANT
GLOVE BIOGEL PI IND STRL 7.0 (GLOVE) IMPLANT
GOWN STRL REUS W/ TWL LRG LVL3 (GOWN DISPOSABLE) ×1 IMPLANT
GOWN STRL REUS W/ TWL XL LVL3 (GOWN DISPOSABLE) ×1 IMPLANT
GOWN STRL REUS W/TWL LRG LVL3 (GOWN DISPOSABLE) ×1
GOWN STRL REUS W/TWL XL LVL3 (GOWN DISPOSABLE) ×1
IV NS 1000ML (IV SOLUTION) ×1
IV NS 1000ML BAXH (IV SOLUTION) ×1 IMPLANT
KIT BASIN OR (CUSTOM PROCEDURE TRAY) ×1 IMPLANT
KIT TURNOVER KIT B (KITS) ×1 IMPLANT
NDL HYPO 25GX1X1/2 BEV (NEEDLE) ×2 IMPLANT
NEEDLE HYPO 25GX1X1/2 BEV (NEEDLE) ×2 IMPLANT
NS IRRIG 1000ML POUR BTL (IV SOLUTION) ×1 IMPLANT
PAD ARMBOARD 7.5X6 YLW CONV (MISCELLANEOUS) ×1 IMPLANT
SLEEVE IRRIGATION ELITE 7 (MISCELLANEOUS) ×1 IMPLANT
SPIKE FLUID TRANSFER (MISCELLANEOUS) ×1 IMPLANT
SPONGE SURGIFOAM ABS GEL 12-7 (HEMOSTASIS) IMPLANT
SUT CHROMIC 3 0 PS 2 (SUTURE) ×1 IMPLANT
SYR BULB IRRIG 60ML STRL (SYRINGE) ×1 IMPLANT
SYR CONTROL 10ML LL (SYRINGE) ×1 IMPLANT
TRAY ENT MC OR (CUSTOM PROCEDURE TRAY) ×1 IMPLANT
TUBING IRRIGATION (MISCELLANEOUS) ×1 IMPLANT
YANKAUER SUCT BULB TIP NO VENT (SUCTIONS) ×1 IMPLANT

## 2022-08-10 NOTE — Op Note (Unsigned)
[name], BLANDO MEDICAL RECORD NO: [id number] ACCOUNT NO: [id number] DATE OF BIRTH: 04-May-1950 FACILITY[facility] LOCATION: [facility] PHYSICIAN: Georgia Lopes, DDS  Operative Report   DATE OF PROCEDURE: 08/10/2022  PREOPERATIVE DIAGNOSIS:  Nonrestorable teeth numbers 3, 6, 8, 9, 10, 11, 12, 13, 14, 15, 19, 21, 22, 23, 24, 25, 26, 27, 28, 30 secondary to dental caries.  POSTOPERATIVE DIAGNOSIS:  Nonrestorable teeth numbers 3, 6, 8, 9, 10, 11, 12, 13, 14, 15, 19, 21, 22, 23, 24, 25, 26, 27, 28, 30 secondary to dental caries.  PROCEDURE:  Extraction teeth numbers 3, 6, 8, 9, 10, 11, 12, 13, 14, 15, 19, 21, 22, 23, 24, 25, 26, 27, 28, 30. Alveoloplasty, right and left maxilla and mandible.  SURGEON:  Georgia Lopes, DDS  ANESTHESIA:  General, nasal intubation, Dr. Sampson Goon attending.  DESCRIPTION OF PROCEDURE:  The patient was taken to the operating room and placed on the table in supine position.  General anesthesia was administered and nasal endotracheal tube was placed and secured.  The eyes were protected and the patient was  draped for surgery.  Timeout was performed.  The posterior pharynx was suctioned and a throat pack was placed.  2% lidocaine 1:100,000 epinephrine was infiltrated in an inferior alveolar block on the right and left side.  Then, buccal and palatal  infiltration was administered in the maxilla around the teeth to be removed.  A bite block was placed on the right side of the mouth.  A sweetheart retractor was used to retract the tongue.  A 15 blade was used to make an incision 1 cm proximal to tooth  #19, carried forward in the buccal and lingual sulcus and then across the edentulous space of tooth #20 and then around teeth numbers 21, 22, 23, 24, 25 and 26 in both the buccal and lingual sulcus.  The periosteum was reflected with a periosteal  elevator and the teeth were elevated with 301 elevator.  Tooth number 19, 23, 24, 25 and 26 were removed with the dental  forceps. The Stryker handpiece was used with the fissure bur under irrigation to remove the root of tooth number 19, 20 and 22.   Then, the sockets were curetted.  The periosteum was reflected to expose the bone and 3 mm round bur was used to remove a small amount of bone at the coronal portion of the bone and in the apical portion where the root tip was to get to good healthy  bone.  Then, the egg bur was used to perform alveoloplasty as the sockets were irregular in contour.  Then, the 15 blade was used to make releasing incisions on the periosteum and then the tissue was reflected and irrigated and closed primarily with 3-0  chromic.  The left maxilla was operated.  The 15 blade was used to make an incision around teeth numbers 15, 14, 13, 12, 11, 10, 9 and 8.  The periosteum was reflected.  The teeth were elevated. Teeth numbers 15, 14, 13 and 12 were removed with the 301  elevator and the forceps.  Tooth #11 fractured upon attempted removal, necessitating removal of circumferential bone with a Stryker handpiece. Then, the root was removed.  Then, teeth numbers 10, 9 and 8 were removed with the forceps and then the sockets  were curetted.  The small round bur was then used to remove coronal bone and apical bone in the sockets of each of the teeth and then the area was irrigated  and the periosteal release was done with a 15 blade in the buccal flap and then the tissue was  closed primarily with 3-0 chromic.  Then, the bite block was repositioned to the other side of the mouth.  A 15 blade was used to make an incision around teeth numbers 27, 28 and 30 in the mandible and around teeth numbers 3 and 6 in the maxilla.  The  periosteum was reflected.  The teeth were elevated. The roots of tooth #3 were removed with rongeur and then tooth #6 required removal of circumferential bone with Stryker handpiece in order to luxate and elevate the teeth with a dental elevator and  forceps.  Then, the tooth was  extracted.  Then, the sockets were curetted.  The round bur was used to decorticate and then the egg bur was used to further smooth the bone and then the area was closed by releasing the periosteum and the flap was reflected  and closed with 3-0 chromic in the mandible on the right side and then the periosteum was reflected around teeth numbers 27, 28 and 30.  The tooth #30 could not be accessed adequately, so bone was removed with Stryker handpiece and fissure bur in order  to gain access to gain purchase point then the root was removed.  Then, 28 and 27 required removal of interproximal bone and circumferential bone around tooth #27 in order to remove these 2 teeth with the 301 elevator and dental forceps.  Then, the  sockets were curetted.  The egg bur was used to decorticate, then the socket was curetted and the 3 mm round bur was used to decorticate the apical and coronal portions and then the egg bur was used to perform alveoloplasty.  Then, the mucosa was  reflected and periosteal releasing incisions were made and the area was closed primarily with 3-0 chromic.  Then, the oral cavity was irrigated and suctioned.  Additional local anesthesia was administered.  The throat pack was removed.  The patient was  left under care of anesthesia for extubation and transport to recovery room with plans for discharge home through day surgery.  ESTIMATED BLOOD LOSS:  Minimal.  COMPLICATIONS:  None.  SPECIMENS:  There were no specimens.  COUNTS:  Counts were correct.   PUS D: 08/10/2022 1:12:52 pm T: 08/10/2022 1:36:00 pm  JOB: [id number]/ [id number]

## 2022-08-10 NOTE — Op Note (Signed)
08/10/2022  1:04 PM  PATIENT:  [name]  72 y.o. female  PRE-OPERATIVE DIAGNOSIS: NON-RESTORABLE  TEETH NUMBER THREE, SIX, EIGHT, NINE, TEN, ELEVEN, TWELVE, THIRTEEN, FOURTEEN, FIFTEEN, NINETEEN, TWENTY-ONE, TWENTY-TWO, TWENTY-THREE, TWENTY-FOUR, TWENTY-FIVE, TWENTY-SIX, TWENTY-SEVEN, TWENTY-EIGHT, THIRTY, SECONDARY TO DENTAL CARRIES  POST-OPERATIVE DIAGNOSIS:  SAME  PROCEDURE:  Procedure(s): EXTRACTION TEETH NUMBER THREE, SIX, EIGHT, NINE, TEN, ELEVEN, TWELVE, THIRTEEN, FOURTEEN, FIFTEEN, NINETEEN, TWENTY-ONE, TWENTY-TWO, TWENTY-THREE, TWENTY-FOUR, TWENTY-FIVE, TWENTY-SIX, TWENTY-SEVEN, TWENTY-EIGHT, THIRTY, ALVEOLOPLASTY RIGHT AND LEFT MAXILLA AND MANDIBLE  SURGEON:  Surgeon(s): Ocie Doyne, DMD  ANESTHESIA:   local and general  EBL:  minimal  DRAINS: none   SPECIMEN:  No Specimen  COUNTS:  YES  PLAN OF CARE: Discharge to home after PACU  PATIENT DISPOSITION:  PACU - hemodynamically stable.   PROCEDURE DETAILS: Dictation #[id number]  Georgia Lopes, DMD 08/10/2022 1:04 PM

## 2022-08-10 NOTE — H&P (Signed)
H&P documentation  -History and Physical Reviewed  -Patient has been re-examined  -No change in the plan of care  [name]  

## 2022-08-10 NOTE — Anesthesia Procedure Notes (Signed)
Procedure Name: Intubation Date/Time: 08/10/2022 11:49 AM  Performed by: Orlin Hilding, CRNAPre-anesthesia Checklist: Patient identified, Emergency Drugs available, Suction available and Patient being monitored Patient Re-evaluated:Patient Re-evaluated prior to induction Oxygen Delivery Method: Circle System Utilized Preoxygenation: Pre-oxygenation with 100% oxygen Induction Type: IV induction Ventilation: Mask ventilation without difficulty and Oral airway inserted - appropriate to patient size Laryngoscope Size: Glidescope and 3 Grade View: Grade I Nasal Tubes: Nasal Rae, Nasal prep performed and Left Number of attempts: 1 Placement Confirmation: ETT inserted through vocal cords under direct vision, positive ETCO2 and breath sounds checked- equal and bilateral Tube secured with: Tape Dental Injury: Teeth and Oropharynx as per pre-operative assessment

## 2022-08-10 NOTE — Transfer of Care (Signed)
Immediate Anesthesia Transfer of Care Note  Patient: [name]  Procedure(s) Performed: EXTRACTION TEETH NUMBER THREE, SIX, EIGHT, NINE, TEN, ELEVEN, TWELVE, THIRTEEN, FOURTEEN, FIFTEEN, NINETEEN, TWENTY-ONE, TWENTY-TWO, TWENTY-THREE, TWENTY-FOUR, TWENTY-FIVE, TWENTY-SIX, TWENTY-SEVEN, TWENTY-EIGHT, THIRTY, ALVEOLOPLASTY (Mouth)  Patient Location: PACU  Anesthesia Type:General  Level of Consciousness: oriented, drowsy, and patient cooperative  Airway & Oxygen Therapy: Patient Spontanous Breathing  Post-op Assessment: Report given to RN and Post -op Vital signs reviewed and stable  Post vital signs: Reviewed and stable  Last Vitals:  Vitals Value Taken Time  BP    Temp    Pulse    Resp    SpO2      Last Pain:  Vitals:   08/10/22 0848  TempSrc:   PainSc: 0-No pain         Complications: No notable events documented.

## 2022-08-12 ENCOUNTER — Encounter (HOSPITAL_COMMUNITY): Payer: Self-pay | Admitting: Oral Surgery

## 2022-08-13 NOTE — Anesthesia Postprocedure Evaluation (Signed)
Anesthesia Post Note  Patient: [name]  Procedure(s) Performed: EXTRACTION TEETH NUMBER THREE, SIX, EIGHT, NINE, TEN, ELEVEN, TWELVE, THIRTEEN, FOURTEEN, FIFTEEN, NINETEEN, TWENTY-ONE, TWENTY-TWO, TWENTY-THREE, TWENTY-FOUR, TWENTY-FIVE, TWENTY-SIX, TWENTY-SEVEN, TWENTY-EIGHT, THIRTY, ALVEOLOPLASTY (Mouth)     Patient location during evaluation: PACU Anesthesia Type: General Level of consciousness: awake and alert Pain management: pain level controlled Vital Signs Assessment: post-procedure vital signs reviewed and stable Respiratory status: spontaneous breathing, nonlabored ventilation, respiratory function stable and patient connected to nasal cannula oxygen Cardiovascular status: blood pressure returned to baseline and stable Postop Assessment: no apparent nausea or vomiting Anesthetic complications: no   No notable events documented.  Last Vitals:  Vitals:   08/10/22 1515 08/10/22 1530  BP: (!) 157/64 (!) 155/67  Pulse: 73 74  Resp: 12 13  Temp:  36.6 C  SpO2: 94% 95%    Last Pain:  Vitals:   08/10/22 1515  TempSrc:   PainSc: Asleep                 Kennieth Rad

## 2022-08-29 ENCOUNTER — Other Ambulatory Visit (HOSPITAL_COMMUNITY): Payer: Self-pay

## 2022-09-03 ENCOUNTER — Ambulatory Visit: Payer: PRIVATE HEALTH INSURANCE | Admitting: Physician Assistant

## 2022-09-03 NOTE — Progress Notes (Deleted)
Office Visit Note  Patient: [name]             Date of Birth: Nov 20, 1950           MRN: [id number]             PCP: Jamal Collin, PA-C Referring: Jamal Collin, PA-C Visit Date: 09/17/2022 Occupation: @GUAROCC @  Subjective:    History of Present Illness: [name] is a 72 y.o. female with history of psoriatic arthritis.  Patient remains on Taltz 80 mg sq injections--started 05/14/22.   TB gold negative on 10/02/21. Order for TB gold released today.  Orders for CBC and CMP released today.  Discussed the importance of holding taltz if she develops signs or symptoms of an infection and to resume once the infection has completely cleared.   Activities of Daily Living:  Patient reports morning stiffness for *** {minute/hour:19697}.   Patient {ACTIONS;DENIES/REPORTS:21021675::"Denies"} nocturnal pain.  Difficulty dressing/grooming: {ACTIONS;DENIES/REPORTS:21021675::"Denies"} Difficulty climbing stairs: {ACTIONS;DENIES/REPORTS:21021675::"Denies"} Difficulty getting out of chair: {ACTIONS;DENIES/REPORTS:21021675::"Denies"} Difficulty using hands for taps, buttons, cutlery, and/or writing: {ACTIONS;DENIES/REPORTS:21021675::"Denies"}  No Rheumatology ROS completed.   PMFS History:  Patient Active Problem List   Diagnosis Date Noted   Dyslipidemia 10/16/2016   History of total knee replacement, bilateral 10/16/2016   Primary osteoarthritis of both hands 10/16/2016   History of diabetes mellitus 10/16/2016   Other chronic pain 10/16/2016   History of glaucoma 10/16/2016   Psoriatic arthropathy (HCC) 07/06/2016   Other psoriasis 07/06/2016   High risk medications (not anticoagulants) long-term use 07/06/2016   Fibromyalgia 07/06/2016   Chronic fatigue 07/06/2016   Primary insomnia 07/06/2016    Past Medical History:  Diagnosis Date   Arthritis    Cataract    Complication of anesthesia    AKI post left TKA, required dialysis x 3 week, 2005   Diabetes  mellitus without complication (HCC)    Glaucoma    Hypertension    Psoriatic arthritis (HCC)    Vertigo     Family History  Problem Relation Age of Onset   Cancer Sister    Diabetes Sister    COPD Brother    Diabetes Brother    Past Surgical History:  Procedure Laterality Date   ABDOMINAL HYSTERECTOMY     APPENDECTOMY     CARPAL TUNNEL RELEASE     cataract surgery Bilateral    CHOLECYSTECTOMY     FEMUR FRACTURE SURGERY Right    FIBULA FRACTURE SURGERY Left 09/01/2018   LEG AMPUTATION Left 03/25/2020   REPLACEMENT TOTAL KNEE BILATERAL     TIBIA FRACTURE SURGERY Left 09/01/2018   TOOTH EXTRACTION N/A 08/10/2022   Procedure: EXTRACTION TEETH NUMBER THREE, SIX, EIGHT, NINE, TEN, ELEVEN, TWELVE, THIRTEEN, FOURTEEN, FIFTEEN, NINETEEN, TWENTY-ONE, TWENTY-TWO, TWENTY-THREE, TWENTY-FOUR, TWENTY-FIVE, TWENTY-SIX, TWENTY-SEVEN, TWENTY-EIGHT, THIRTY, ALVEOLOPLASTY;  Surgeon: Ocie Doyne, DMD;  Location: [facility];  Service: Oral Surgery;  Laterality: N/A;   TOTAL SHOULDER ARTHROPLASTY     TRIGGER FINGER RELEASE     TRIGGER FINGER RELEASE Right 02/2017   3rd and 4th digits.   Social History   Social History Narrative   Not on file   Immunization History  Administered Date(s) Administered   Janssen (J&J) SARS-COV-2 Vaccination 02/22/2020     Objective: Vital Signs: There were no vitals taken for this visit.   Physical Exam Vitals and nursing note reviewed.  Constitutional:      Appearance: She is well-developed.  HENT:     Head: Normocephalic and atraumatic.  Eyes:  Conjunctiva/sclera: Conjunctivae normal.  Cardiovascular:     Rate and Rhythm: Normal rate and regular rhythm.     Heart sounds: Normal heart sounds.  Pulmonary:     Effort: Pulmonary effort is normal.     Breath sounds: Normal breath sounds.  Abdominal:     General: Bowel sounds are normal.     Palpations: Abdomen is soft.  Musculoskeletal:     Cervical back: Normal range of motion.  Lymphadenopathy:      Cervical: No cervical adenopathy.  Skin:    General: Skin is warm and dry.     Capillary Refill: Capillary refill takes less than 2 seconds.  Neurological:     Mental Status: She is alert and oriented to person, place, and time.  Psychiatric:        Behavior: Behavior normal.      Musculoskeletal Exam: ***  CDAI Exam: CDAI Score: -- Patient Global: --; Provider Global: -- Swollen: --; Tender: -- Joint Exam 09/17/2022   No joint exam has been documented for this visit   There is currently no information documented on the homunculus. Go to the Rheumatology activity and complete the homunculus joint exam.  Investigation: No additional findings.  Imaging: No results found.  Recent Labs: Lab Results  Component Value Date   WBC 6.9 08/10/2022   HGB 13.4 08/10/2022   PLT 227 08/10/2022   NA 135 08/10/2022   K 4.6 08/10/2022   CL 102 08/10/2022   CO2 26 08/10/2022   GLUCOSE 76 08/10/2022   BUN 25 (H) 08/10/2022   CREATININE 1.17 (H) 08/10/2022   BILITOT 0.4 06/04/2022   ALKPHOS 70 07/18/2016   AST 12 06/04/2022   ALT 15 06/04/2022   PROT 6.3 06/04/2022   ALBUMIN 3.8 07/18/2016   CALCIUM 8.6 (L) 08/10/2022   GFRAA 73 08/17/2020   QFTBGOLDPLUS NEGATIVE 10/02/2021    Speciality Comments: [place] started 08/24/20  Procedures:  No procedures performed Allergies: Apremilast, Bee venom, Vancomycin, Aspirin, Cephalosporins, Clarithromycin, Clindamycin/lincomycin, Demerol, E-mycin [erythromycin base], Etodolac, Gatifloxacin, Iodinated contrast media, Levaquin  [levofloxacin in d5w], Levofloxacin, Levofloxacin, Lyrica [pregabalin], Meperidine, Morphine and codeine, Nsaids, Phenothiazines, Shellfish-derived products, Tequin, Codeine, Penicillins, and Sulfa drugs cross reactors   Assessment / Plan:     Visit Diagnoses: No diagnosis found.  Orders: No orders of the defined types were placed in this encounter.  No orders of the defined types were placed in this  encounter.   Face-to-face time spent with patient was *** minutes. Greater than 50% of time was spent in counseling and coordination of care.  Follow-Up Instructions: No follow-ups on file.   Ellen Henri, CMA  Note - This record has been created using Animal nutritionist.  Chart creation errors have been sought, but may not always  have been located. Such creation errors do not reflect on  the standard of medical care.

## 2022-09-05 ENCOUNTER — Other Ambulatory Visit (HOSPITAL_COMMUNITY): Payer: Self-pay

## 2022-09-17 ENCOUNTER — Ambulatory Visit: Payer: Self-pay | Admitting: Physician Assistant

## 2022-09-17 DIAGNOSIS — Z79899 Other long term (current) drug therapy: Secondary | ICD-10-CM

## 2022-09-17 DIAGNOSIS — L4050 Arthropathic psoriasis, unspecified: Secondary | ICD-10-CM

## 2022-09-17 DIAGNOSIS — M810 Age-related osteoporosis without current pathological fracture: Secondary | ICD-10-CM

## 2022-09-17 DIAGNOSIS — Z8781 Personal history of (healed) traumatic fracture: Secondary | ICD-10-CM

## 2022-09-17 DIAGNOSIS — S78112A Complete traumatic amputation at level between left hip and knee, initial encounter: Secondary | ICD-10-CM

## 2022-09-17 DIAGNOSIS — E785 Hyperlipidemia, unspecified: Secondary | ICD-10-CM

## 2022-09-17 DIAGNOSIS — M797 Fibromyalgia: Secondary | ICD-10-CM

## 2022-09-17 DIAGNOSIS — Z8639 Personal history of other endocrine, nutritional and metabolic disease: Secondary | ICD-10-CM

## 2022-09-17 DIAGNOSIS — F5101 Primary insomnia: Secondary | ICD-10-CM

## 2022-09-17 DIAGNOSIS — M24541 Contracture, right hand: Secondary | ICD-10-CM

## 2022-09-17 DIAGNOSIS — Z96651 Presence of right artificial knee joint: Secondary | ICD-10-CM

## 2022-09-17 DIAGNOSIS — M19041 Primary osteoarthritis, right hand: Secondary | ICD-10-CM

## 2022-09-17 DIAGNOSIS — L408 Other psoriasis: Secondary | ICD-10-CM

## 2022-10-03 ENCOUNTER — Other Ambulatory Visit (HOSPITAL_COMMUNITY): Payer: Self-pay

## 2022-10-03 ENCOUNTER — Other Ambulatory Visit: Payer: Self-pay

## 2022-10-09 ENCOUNTER — Other Ambulatory Visit (HOSPITAL_COMMUNITY): Payer: Self-pay

## 2022-10-25 ENCOUNTER — Other Ambulatory Visit (HOSPITAL_COMMUNITY): Payer: Self-pay

## 2022-10-30 ENCOUNTER — Other Ambulatory Visit: Payer: Self-pay

## 2022-10-30 ENCOUNTER — Other Ambulatory Visit (HOSPITAL_COMMUNITY): Payer: Self-pay

## 2022-10-30 ENCOUNTER — Other Ambulatory Visit: Payer: Self-pay | Admitting: Physician Assistant

## 2022-10-30 DIAGNOSIS — L408 Other psoriasis: Secondary | ICD-10-CM

## 2022-10-30 DIAGNOSIS — L4050 Arthropathic psoriasis, unspecified: Secondary | ICD-10-CM

## 2022-10-30 DIAGNOSIS — Z79899 Other long term (current) drug therapy: Secondary | ICD-10-CM

## 2022-10-30 MED ORDER — TALTZ 80 MG/ML ~~LOC~~ SOAJ
80.0000 mg | SUBCUTANEOUS | 0 refills | Status: DC
Start: 2022-10-30 — End: 2022-12-03
  Filled 2022-10-30: qty 1, 28d supply, fill #0

## 2022-10-30 NOTE — Telephone Encounter (Signed)
Last Fill: 07/30/2022  Labs: 08/10/2022 CBC WNL, 08/14/2022 Sodium 134, Chloride 96, Glucose, Random 63, Creat. 1.34, GFR 42  TB Gold: 10/02/2021 NEg    Next Visit: Due July 2024. Message sent to the front to schedule.   Last Visit: 06/04/2022  [id number] arthropath   Current Dose per office note 06/04/2022: Taltz 80 mg sq injections   Okay to refill Taltz?

## 2022-10-30 NOTE — Telephone Encounter (Signed)
LMOM for patient to schedule follow up appointment. °

## 2022-10-30 NOTE — Telephone Encounter (Signed)
Please schedule patient a follow up visit. Patient was due July 2024. Thanks!   Follow-Up Instructions: Return in about 3 months (around 09/03/2022) for Psoriatic arthritis, Fibromyalgia.

## 2022-11-05 ENCOUNTER — Other Ambulatory Visit (HOSPITAL_COMMUNITY): Payer: Self-pay

## 2022-11-27 NOTE — Progress Notes (Signed)
Office Visit Note  Patient: [name]             Date of Birth: 08/09/1950           MRN: [id number]             PCP: Jamal Collin, PA-C Referring: Jamal Collin, PA-C Visit Date: 12/10/2022 Occupation: @GUAROCC @  Subjective:  Joint stiffness   History of Present Illness: [name] is a 72 y.o. female with history of psoriatic arthritis.  Patient remains on Taltz 80 mg subcu days injections once monthly.  She was initiated on Taltz on 05/14/2022.  Patient states that her symptoms have been unchanged since her last office visit.  She continues to have stiffness involving both hands especially first thing in the mornings.  She denies any Achilles tendinitis or plantar fasciitis.  She continues to have chronic lower back pain and stiffness.  She experiences muscle spasms throughout the day and takes methocarbamol for relief.  She denies any new patches of psoriasis.  She denies any new concerns at this time.  She denies any recent or recurrent infections.    Activities of Daily Living:  Patient reports morning stiffness for 2-3 hours.   Patient Reports nocturnal pain.  Difficulty dressing/grooming: Denies Difficulty climbing stairs: Reports Difficulty getting out of chair: Reports Difficulty using hands for taps, buttons, cutlery, and/or writing: Reports  Review of Systems  Constitutional:  Positive for fatigue.  HENT:  Positive for mouth dryness. Negative for mouth sores.   Eyes:  Positive for dryness.  Respiratory:  Negative for cough, shortness of breath and wheezing.   Cardiovascular:  Negative for chest pain and palpitations.  Gastrointestinal:  Positive for constipation. Negative for blood in stool and diarrhea.  Endocrine: Negative for increased urination.  Genitourinary:  Negative for involuntary urination.  Musculoskeletal:  Positive for joint pain, joint pain, joint swelling, myalgias, muscle weakness, morning stiffness, muscle tenderness and myalgias.   Skin:  Negative for color change, rash, hair loss and sensitivity to sunlight.  Allergic/Immunologic: Positive for susceptible to infections.  Neurological:  Negative for dizziness and headaches.  Hematological:  Positive for swollen glands.  Psychiatric/Behavioral:  Positive for depressed mood and sleep disturbance. The patient is nervous/anxious.     PMFS History:  Patient Active Problem List   Diagnosis Date Noted  . Dyslipidemia 10/16/2016  . History of total knee replacement, bilateral 10/16/2016  . Primary osteoarthritis of both hands 10/16/2016  . History of diabetes mellitus 10/16/2016  . Other chronic pain 10/16/2016  . History of glaucoma 10/16/2016  . Psoriatic arthropathy (HCC) 07/06/2016  . Other psoriasis 07/06/2016  . High risk medications (not anticoagulants) long-term use 07/06/2016  . Fibromyalgia 07/06/2016  . Chronic fatigue 07/06/2016  . Primary insomnia 07/06/2016    Past Medical History:  Diagnosis Date  . Arthritis   . Cataract   . Complication of anesthesia    AKI post left TKA, required dialysis x 3 week, 2005  . Diabetes mellitus without complication (HCC)   . Glaucoma   . Hypertension   . Psoriatic arthritis (HCC)   . Vertigo     Family History  Problem Relation Age of Onset  . Cancer Sister   . Diabetes Sister   . COPD Brother   . Diabetes Brother    Past Surgical History:  Procedure Laterality Date  . ABDOMINAL HYSTERECTOMY    . APPENDECTOMY    . CARPAL TUNNEL RELEASE    . cataract surgery Bilateral   .  CHOLECYSTECTOMY    . FEMUR FRACTURE SURGERY Right   . FIBULA FRACTURE SURGERY Left 09/01/2018  . LEG AMPUTATION Left 03/25/2020  . REPLACEMENT TOTAL KNEE BILATERAL    . TIBIA FRACTURE SURGERY Left 09/01/2018  . TOOTH EXTRACTION N/A 08/10/2022   Procedure: EXTRACTION TEETH NUMBER THREE, SIX, EIGHT, NINE, TEN, ELEVEN, TWELVE, THIRTEEN, FOURTEEN, FIFTEEN, NINETEEN, TWENTY-ONE, TWENTY-TWO, TWENTY-THREE, TWENTY-FOUR, TWENTY-FIVE,  TWENTY-SIX, TWENTY-SEVEN, TWENTY-EIGHT, THIRTY, ALVEOLOPLASTY;  Surgeon: Ocie Doyne, DMD;  Location: [facility] OR;  Service: Oral Surgery;  Laterality: N/A;  . TOTAL SHOULDER ARTHROPLASTY    . TRIGGER FINGER RELEASE    . TRIGGER FINGER RELEASE Right 02/2017   3rd and 4th digits.   Social History   Social History Narrative  . Not on file   Immunization History  Administered Date(s) Administered  . [employer] (J[employer]J) SARS-COV-2 Vaccination 12/03/2019, 02/22/2020     Objective: Vital Signs: BP 112/64 (BP Location: Left Arm, Patient Position: Sitting, Cuff Size: Large)   Pulse 67   Resp 14   Ht 5\' 6"  (1.676 m)   Wt 240 lb (108.9 kg) Comment: per patient  BMI 38.74 kg/m    Physical Exam Vitals and nursing note reviewed.  Constitutional:      Appearance: She is well-developed.  HENT:     Head: Normocephalic and atraumatic.  Eyes:     Conjunctiva/sclera: Conjunctivae normal.  Cardiovascular:     Rate and Rhythm: Normal rate and regular rhythm.     Heart sounds: Normal heart sounds.  Pulmonary:     Effort: Pulmonary effort is normal.     Breath sounds: Normal breath sounds.  Abdominal:     General: Bowel sounds are normal.     Palpations: Abdomen is soft.  Musculoskeletal:     Cervical back: Normal range of motion.  Lymphadenopathy:     Cervical: No cervical adenopathy.  Skin:    General: Skin is warm and dry.     Capillary Refill: Capillary refill takes less than 2 seconds.  Neurological:     Mental Status: She is alert and oriented to person, place, and time.  Psychiatric:        Behavior: Behavior normal.     Musculoskeletal Exam: Patient remained seated in a motorized scooter today.  C-spine has limited range of motion with lateral rotation.  Thoracic kyphosis noted.  Midline spinal tenderness in the lumbar region.  Stiffness with range of motion of both shoulder joints.  Wrist joints have good ROM with no tenderness or swelling.  PIP and DIP thickening consistent with  osteoarthritis of both hands. Right 4th PIP contracture.  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  Right knee replacement has good ROM.  No tenderness or swelling of the right ankle.  No evidence of achilles tendonitis or plantar fasciitis.    CDAI Exam: CDAI Score: -- Patient Global: --; Provider Global: -- Swollen: --; Tender: -- Joint Exam 12/10/2022   No joint exam has been documented for this visit   There is currently no information documented on the homunculus. Go to the Rheumatology activity and complete the homunculus joint exam.  Investigation: No additional findings.  Imaging: No results found.  Recent Labs: Lab Results  Component Value Date   WBC 6.9 08/10/2022   HGB 13.4 08/10/2022   PLT 227 08/10/2022   NA 135 08/10/2022   K 4.6 08/10/2022   CL 102 08/10/2022   CO2 26 08/10/2022   GLUCOSE 76 08/10/2022   BUN 25 (H) 08/10/2022   CREATININE 1.17 (  H) 08/10/2022   BILITOT 0.4 06/04/2022   ALKPHOS 70 07/18/2016   AST 12 06/04/2022   ALT 15 06/04/2022   PROT 6.3 06/04/2022   ALBUMIN 3.8 07/18/2016   CALCIUM 8.6 (L) 08/10/2022   GFRAA 73 08/17/2020   QFTBGOLDPLUS NEGATIVE 10/02/2021    Speciality Comments: [place] started 08/24/20  Procedures:  No procedures performed Allergies: Apremilast, Bee venom, Vancomycin, Aspirin, Cephalosporins, Clarithromycin, Clindamycin/lincomycin, Demerol, E-mycin [erythromycin base], Etodolac, Gatifloxacin, Iodinated contrast media, Levaquin  [levofloxacin in d5w], Levofloxacin, Levofloxacin, Lyrica [pregabalin], Meperidine, Morphine and codeine, Nsaids, Phenothiazines, Shellfish-derived products, Tequin, Codeine, Penicillins, and Sulfa drugs cross reactors   Assessment / Plan:     Visit Diagnoses: Psoriatic arthropathy (HCC): She has no synovitis or dactylitis on examination today.  She experiences joint stiffness first thing in the morning lasting 2 to 3 hours primarily in her hands and lower back.  Patient remains on Taltz 80 mg sq  injections once monthly.  She continues to tolerate Taltz without any side effects or injection site reactions.  She has not had any recent or recurrent infections.  She has no active inflammation at this time. Most of her discomfort seems to be due to underlying osteoarthritis. She was advised to notify us if she develops signs or symptoms of a flare.  She will follow-up in the office in 3 months or sooner if needed.  Other psoriasis: No active psoriasis at this time.   High risk medication use - Taltz 80 mg sq injections once monthly--started 05/14/22. Previous: MTX (LFTs 2017), Remicade-rxn, Enbrel, humira, otezla, cosentyx,Allergy to sulfa,Otezla-dysphagia.  CBC and CMP updated on 11/06/22.  Her next lab work will be due in December and every 3 months to monitor for drug toxicity. TB gold negative on 10/02/21.  Future order for TB Gold placed today.  No lab tech was available in the office to have the TB gold drawn at her appointment. She plans on returning a different day to update TB gold. No recent or recurrent infections.  Discussed the importance of holding cosentyx if she develops signs or symptoms of an infection and to resume once the infection has completley cleared.    - Plan: QuantiFERON-TB Gold Plus  Screening for tuberculosis - Future order for TB gold placed today. Plan: QuantiFERON-TB Gold Plus  Primary osteoarthritis of both hands: Patient continues to experience chronic pain and stiffness involving both hands especially first thing in the morning.  No synovitis noted on examination today.  History of total right knee replacement: Good range of motion of the right knee replacement.  No warmth or effusion noted.  Using motorized scooter.   Above knee amputation of left lower extremity (HCC): Using motorized scooter.   Age-related osteoporosis without current pathological fracture - DEXA 09/07/21: Lumbar spine BMD 1.1218 T-score 1.6.  LFN T-score -1.4, right forearm T-score -1.7.   She remains on Prolia 60 mg sq q71months.  Fibromyalgia: Patient continues to have generalized myalgias and muscle tenderness due to fibromyalgia.  Primary insomnia: She has difficulty sleeping at night due to nocturnal pain at times.  Contracture of hand joint, right  Other medical conditions are listed as follows:   History of fracture of tibia  History of fibula fracture  Dyslipidemia  History of diabetes mellitus  Orders: Orders Placed This Encounter  Procedures  . QuantiFERON-TB Gold Plus   No orders of the defined types were placed in this encounter.   Follow-Up Instructions: Return in about 3 months (around 03/12/2023) for Psoriatic arthritis.  Gearldine Bienenstock, PA-C  Note - This record has been created using Dragon software.  Chart creation errors have been sought, but may not always  have been located. Such creation errors do not reflect on  the standard of medical care.

## 2022-12-03 ENCOUNTER — Other Ambulatory Visit: Payer: Self-pay | Admitting: Physician Assistant

## 2022-12-03 ENCOUNTER — Other Ambulatory Visit: Payer: Self-pay

## 2022-12-03 ENCOUNTER — Other Ambulatory Visit (HOSPITAL_COMMUNITY): Payer: Self-pay | Admitting: Pharmacy Technician

## 2022-12-03 ENCOUNTER — Other Ambulatory Visit (HOSPITAL_COMMUNITY): Payer: Self-pay

## 2022-12-03 DIAGNOSIS — L4050 Arthropathic psoriasis, unspecified: Secondary | ICD-10-CM

## 2022-12-03 DIAGNOSIS — Z79899 Other long term (current) drug therapy: Secondary | ICD-10-CM

## 2022-12-03 DIAGNOSIS — L408 Other psoriasis: Secondary | ICD-10-CM

## 2022-12-03 MED ORDER — TALTZ 80 MG/ML ~~LOC~~ SOAJ
80.0000 mg | SUBCUTANEOUS | 2 refills | Status: DC
Start: 2022-12-03 — End: 2023-03-05
  Filled 2022-12-03: qty 1, 28d supply, fill #0
  Filled 2023-01-01: qty 1, 28d supply, fill #1
  Filled 2023-01-30: qty 1, 28d supply, fill #2

## 2022-12-03 NOTE — Telephone Encounter (Signed)
Last Fill: 10/30/2022  Labs: 11/06/2022 Glucose 224, Albumin 3.4, Total Protein 6.0, Alkaline Phosphatase 109, AST 59, ALT 56  TB Gold: 10/02/2021 TB gold negative. [facility] lab are due.  Next Visit: 12/10/2022  Last Visit: 06/04/2022  WF:UXNATFTDD arthropathy   Current Dose per office note 06/04/2022:  Taltz 80 mg sq injections   Okay to refill Taltz?

## 2022-12-03 NOTE — Progress Notes (Signed)
Specialty Pharmacy Refill Coordination Note  [name] is a 72 y.o. female contacted today regarding refills of specialty medication(s) Ixekizumab   Patient requested Delivery   Delivery date: 12/11/22   Verified address: [address] [place]   Medication will be filled on 12/10/22.  Refill Request sent to MD; Call if any delays

## 2022-12-04 ENCOUNTER — Telehealth: Payer: Self-pay | Admitting: *Deleted

## 2022-12-04 NOTE — Telephone Encounter (Signed)
Patient contacted the office and left message stating she received a call that she needs labs.   Attempted to contact the patient and left message to advise patient she can update her lab work at her upcoming appointment on 12/10/2022.

## 2022-12-10 ENCOUNTER — Ambulatory Visit: Payer: 59 | Attending: Physician Assistant | Admitting: Physician Assistant

## 2022-12-10 ENCOUNTER — Encounter: Payer: Self-pay | Admitting: Physician Assistant

## 2022-12-10 VITALS — BP 112/64 | HR 67 | Resp 14 | Ht 66.0 in | Wt 240.0 lb

## 2022-12-10 DIAGNOSIS — M19042 Primary osteoarthritis, left hand: Secondary | ICD-10-CM

## 2022-12-10 DIAGNOSIS — M19041 Primary osteoarthritis, right hand: Secondary | ICD-10-CM

## 2022-12-10 DIAGNOSIS — Z8639 Personal history of other endocrine, nutritional and metabolic disease: Secondary | ICD-10-CM

## 2022-12-10 DIAGNOSIS — Z79899 Other long term (current) drug therapy: Secondary | ICD-10-CM

## 2022-12-10 DIAGNOSIS — S78112A Complete traumatic amputation at level between left hip and knee, initial encounter: Secondary | ICD-10-CM

## 2022-12-10 DIAGNOSIS — M797 Fibromyalgia: Secondary | ICD-10-CM

## 2022-12-10 DIAGNOSIS — F5101 Primary insomnia: Secondary | ICD-10-CM

## 2022-12-10 DIAGNOSIS — M24541 Contracture, right hand: Secondary | ICD-10-CM

## 2022-12-10 DIAGNOSIS — M810 Age-related osteoporosis without current pathological fracture: Secondary | ICD-10-CM

## 2022-12-10 DIAGNOSIS — Z8781 Personal history of (healed) traumatic fracture: Secondary | ICD-10-CM

## 2022-12-10 DIAGNOSIS — L408 Other psoriasis: Secondary | ICD-10-CM

## 2022-12-10 DIAGNOSIS — Z111 Encounter for screening for respiratory tuberculosis: Secondary | ICD-10-CM

## 2022-12-10 DIAGNOSIS — E785 Hyperlipidemia, unspecified: Secondary | ICD-10-CM

## 2022-12-10 DIAGNOSIS — L4050 Arthropathic psoriasis, unspecified: Secondary | ICD-10-CM

## 2022-12-10 DIAGNOSIS — Z96651 Presence of right artificial knee joint: Secondary | ICD-10-CM

## 2022-12-10 NOTE — Patient Instructions (Signed)
Standing Labs We placed an order today for your standing lab work.   Please have your standing labs drawn in December and every 3 months   Please have your labs drawn 2 weeks prior to your appointment so that the provider can discuss your lab results at your appointment, if possible.  Please note that you may see your imaging and lab results in MyChart before we have reviewed them. We will contact you once all results are reviewed. Please allow our office up to 72 hours to thoroughly review all of the results before contacting the office for clarification of your results.  WALK-IN LAB HOURS  Monday through Thursday from 8:00 am -12:30 pm and 1:00 pm-5:00 pm and Friday from 8:00 am-12:00 pm.  Patients with office visits requiring labs will be seen before walk-in labs.  You may encounter longer than normal wait times. Please allow additional time. Wait times may be shorter on  Monday and Thursday afternoons.  We do not book appointments for walk-in labs. We appreciate your patience and understanding with our staff.   Labs are drawn by [facility]. Please bring your co-pay at the time of your lab draw.  You may receive a bill from [facility] for your lab work.  Please note if you are on Hydroxychloroquine and and an order has been placed for a Hydroxychloroquine level,  you will need to have it drawn 4 hours or more after your last dose.  If you wish to have your labs drawn at another location, please call the office 24 hours in advance so we can fax the orders.  The office is located at [address], [place], [place] [address]   If you have any questions regarding directions or hours of operation,  please call [phone].   As a reminder, please drink plenty of water prior to coming for your lab work. Thanks!

## 2022-12-31 ENCOUNTER — Other Ambulatory Visit: Payer: Self-pay | Admitting: *Deleted

## 2022-12-31 DIAGNOSIS — Z111 Encounter for screening for respiratory tuberculosis: Secondary | ICD-10-CM

## 2022-12-31 DIAGNOSIS — Z79899 Other long term (current) drug therapy: Secondary | ICD-10-CM

## 2023-01-01 ENCOUNTER — Other Ambulatory Visit (HOSPITAL_COMMUNITY): Payer: Self-pay

## 2023-01-01 ENCOUNTER — Other Ambulatory Visit: Payer: Self-pay

## 2023-01-01 NOTE — Progress Notes (Signed)
CBC WNL Creatinine remains elevated but has improved slightly-1.13 and GFR is low-52.  Avoid NSAIDs.  Could be due to use of lasix

## 2023-01-01 NOTE — Progress Notes (Signed)
Specialty Pharmacy Refill Coordination Note  [name] is a 72 y.o. female contacted today regarding refills of specialty medication(s) Ixekizumab   Patient requested Delivery   Delivery date: 01/10/23   Verified address: [address] [place]   Medication will be filled on 01/09/23.

## 2023-01-02 LAB — CBC WITH DIFFERENTIAL/PLATELET
Absolute Lymphocytes: 3231 {cells}/uL (ref 850–3900)
Absolute Monocytes: 619 {cells}/uL (ref 200–950)
Basophils Absolute: 18 {cells}/uL (ref 0–200)
Basophils Relative: 0.2 %
Eosinophils Absolute: 164 {cells}/uL (ref 15–500)
Eosinophils Relative: 1.8 %
HCT: 40.6 % (ref 35.0–45.0)
Hemoglobin: 12.9 g/dL (ref 11.7–15.5)
MCH: 28.4 pg (ref 27.0–33.0)
MCHC: 31.8 g/dL — ABNORMAL LOW (ref 32.0–36.0)
MCV: 89.4 fL (ref 80.0–100.0)
MPV: 10.7 fL (ref 7.5–12.5)
Monocytes Relative: 6.8 %
Neutro Abs: 5069 {cells}/uL (ref 1500–7800)
Neutrophils Relative %: 55.7 %
Platelets: 232 10*3/uL (ref 140–400)
RBC: 4.54 10*6/uL (ref 3.80–5.10)
RDW: 11.8 % (ref 11.0–15.0)
Total Lymphocyte: 35.5 %
WBC: 9.1 10*3/uL (ref 3.8–10.8)

## 2023-01-02 LAB — COMPLETE METABOLIC PANEL WITH GFR
AG Ratio: 1.2 (calc) (ref 1.0–2.5)
ALT: 12 U/L (ref 6–29)
AST: 13 U/L (ref 10–35)
Albumin: 3.5 g/dL — ABNORMAL LOW (ref 3.6–5.1)
Alkaline phosphatase (APISO): 56 U/L (ref 37–153)
BUN/Creatinine Ratio: 15 (calc) (ref 6–22)
BUN: 17 mg/dL (ref 7–25)
CO2: 33 mmol/L — ABNORMAL HIGH (ref 20–32)
Calcium: 9.5 mg/dL (ref 8.6–10.4)
Chloride: 102 mmol/L (ref 98–110)
Creat: 1.13 mg/dL — ABNORMAL HIGH (ref 0.60–1.00)
Globulin: 2.9 g/dL (ref 1.9–3.7)
Glucose, Bld: 108 mg/dL — ABNORMAL HIGH (ref 65–99)
Potassium: 5.3 mmol/L (ref 3.5–5.3)
Sodium: 141 mmol/L (ref 135–146)
Total Bilirubin: 0.4 mg/dL (ref 0.2–1.2)
Total Protein: 6.4 g/dL (ref 6.1–8.1)
eGFR: 52 mL/min/{1.73_m2} — ABNORMAL LOW (ref >=60)

## 2023-01-02 LAB — QUANTIFERON-TB GOLD PLUS
Mitogen-NIL: 7.01 [IU]/mL
NIL: 0.01 [IU]/mL
QuantiFERON-TB Gold Plus: NEGATIVE
TB1-NIL: 0.02 [IU]/mL
TB2-NIL: 0.02 [IU]/mL

## 2023-01-02 NOTE — Progress Notes (Signed)
TB gold negative

## 2023-01-30 ENCOUNTER — Other Ambulatory Visit: Payer: Self-pay

## 2023-01-30 NOTE — Progress Notes (Signed)
Specialty Pharmacy Refill Coordination Note  [name] is a 72 y.o. female contacted today regarding refills of specialty medication(s) Ixekizumab   Patient requested Delivery   Delivery date: 02/08/23   Verified address: [address] [place]   Medication will be filled on 02/07/23.

## 2023-01-30 NOTE — Progress Notes (Signed)
Specialty Pharmacy Ongoing Clinical Assessment Note  [name] is a 72 y.o. female who is being followed by the specialty pharmacy service for RxSp Psoriatic Arthritis   Patient's specialty medication(s) reviewed today: Ixekizumab   Missed doses in the last 4 weeks: 0   Patient/Caregiver did not have any additional questions or concerns.   Therapeutic benefit summary: Patient is achieving benefit   Adverse events/side effects summary: No adverse events/side effects   Patient's therapy is appropriate to: Continue    Goals Addressed             This Visit's Progress    Reduce signs and symptoms       Patient is on track. Patient will maintain adherence. Patient states that she has some bad days still especially due to weather but is seeing good results overall and has not noticed any changes in general recently.          Follow up:  6 months  Otto Herb Specialty Pharmacist

## 2023-02-07 ENCOUNTER — Other Ambulatory Visit: Payer: Self-pay

## 2023-03-04 NOTE — Progress Notes (Deleted)
 Office Visit Note  Patient: [name]             Date of Birth: 1950/03/23           MRN: [id number]             PCP: Jamal Collin, PA-C Referring: [place]ye [place] Visit Date: 03/18/2023 Occupation: @GUAROCC @  Subjective:    History of Present Illness: [name] is a 73 y.o. female with history of psoriatic arthritis, fibromyalgia, and osteoarthritis.  Patient remains on Taltz 80 mg sq injections once monthly--started 05/14/22  CBC and CMP updated on 12/31/22. Orders for CBC and CMP released today.  TB gold negative on 12/31/22.  Discussed the importance of holding taltz if she develops signs or symptoms of an infection and to resume once the infection has completely cleared.    Activities of Daily Living:  Patient reports morning stiffness for *** {minute/hour:19697}.   Patient {ACTIONS;DENIES/REPORTS:21021675::"Denies"} nocturnal pain.  Difficulty dressing/grooming: {ACTIONS;DENIES/REPORTS:21021675::"Denies"} Difficulty climbing stairs: {ACTIONS;DENIES/REPORTS:21021675::"Denies"} Difficulty getting out of chair: {ACTIONS;DENIES/REPORTS:21021675::"Denies"} Difficulty using hands for taps, buttons, cutlery, and/or writing: {ACTIONS;DENIES/REPORTS:21021675::"Denies"}  No Rheumatology ROS completed.   PMFS History:  Patient Active Problem List   Diagnosis Date Noted   Dyslipidemia 10/16/2016   History of total knee replacement, bilateral 10/16/2016   Primary osteoarthritis of both hands 10/16/2016   History of diabetes mellitus 10/16/2016   Other chronic pain 10/16/2016   History of glaucoma 10/16/2016   Psoriatic arthropathy (HCC) 07/06/2016   Other psoriasis 07/06/2016   High risk medications (not anticoagulants) long-term use 07/06/2016   Fibromyalgia 07/06/2016   Chronic fatigue 07/06/2016   Primary insomnia 07/06/2016    Past Medical History:  Diagnosis Date   Arthritis    Cataract    Complication of anesthesia    AKI post left TKA,  required dialysis x 3 week, 2005   Diabetes mellitus without complication (HCC)    Glaucoma    Hypertension    Psoriatic arthritis (HCC)    Vertigo     Family History  Problem Relation Age of Onset   Cancer Sister    Diabetes Sister    COPD Brother    Diabetes Brother    Past Surgical History:  Procedure Laterality Date   ABDOMINAL HYSTERECTOMY     APPENDECTOMY     CARPAL TUNNEL RELEASE     cataract surgery Bilateral    CHOLECYSTECTOMY     FEMUR FRACTURE SURGERY Right    FIBULA FRACTURE SURGERY Left 09/01/2018   LEG AMPUTATION Left 03/25/2020   REPLACEMENT TOTAL KNEE BILATERAL     TIBIA FRACTURE SURGERY Left 09/01/2018   TOOTH EXTRACTION N/A 08/10/2022   Procedure: EXTRACTION TEETH NUMBER THREE, SIX, EIGHT, NINE, TEN, ELEVEN, TWELVE, THIRTEEN, FOURTEEN, FIFTEEN, NINETEEN, TWENTY-ONE, TWENTY-TWO, TWENTY-THREE, TWENTY-FOUR, TWENTY-FIVE, TWENTY-SIX, TWENTY-SEVEN, TWENTY-EIGHT, THIRTY, ALVEOLOPLASTY;  Surgeon: Ocie Doyne, DMD;  Location: [facility] OR;  Service: Oral Surgery;  Laterality: N/A;   TOTAL SHOULDER ARTHROPLASTY     TRIGGER FINGER RELEASE     TRIGGER FINGER RELEASE Right 02/2017   3rd and 4th digits.   Social History   Social History Narrative   Not on file   Immunization History  Administered Date(s) Administered   [employer] (J[employer]) SARS-COV-2 Vaccination 12/03/2019, 02/22/2020     Objective: Vital Signs: There were no vitals taken for this visit.   Physical Exam Vitals and nursing note reviewed.  Constitutional:      Appearance: She is well-developed.  HENT:     Head: Normocephalic and atraumatic.  Eyes:     Conjunctiva/sclera: Conjunctivae normal.  Cardiovascular:     Rate and Rhythm: Normal rate and regular rhythm.     Heart sounds: Normal heart sounds.  Pulmonary:     Effort: Pulmonary effort is normal.     Breath sounds: Normal breath sounds.  Abdominal:     General: Bowel sounds are normal.     Palpations: Abdomen is soft.  Musculoskeletal:      Cervical back: Normal range of motion.  Lymphadenopathy:     Cervical: No cervical adenopathy.  Skin:    General: Skin is warm and dry.     Capillary Refill: Capillary refill takes less than 2 seconds.  Neurological:     Mental Status: She is alert and oriented to person, place, and time.  Psychiatric:        Behavior: Behavior normal.      Musculoskeletal Exam: ***  CDAI Exam: CDAI Score: -- Patient Global: --; Provider Global: -- Swollen: --; Tender: -- Joint Exam 03/18/2023   No joint exam has been documented for this visit   There is currently no information documented on the homunculus. Go to the Rheumatology activity and complete the homunculus joint exam.  Investigation: No additional findings.  Imaging: No results found.  Recent Labs: Lab Results  Component Value Date   WBC 9.1 12/31/2022   HGB 12.9 12/31/2022   PLT 232 12/31/2022   NA 141 12/31/2022   K 5.3 12/31/2022   CL 102 12/31/2022   CO2 33 (H) 12/31/2022   GLUCOSE 108 (H) 12/31/2022   BUN 17 12/31/2022   CREATININE 1.13 (H) 12/31/2022   BILITOT 0.4 12/31/2022   ALKPHOS 70 07/18/2016   AST 13 12/31/2022   ALT 12 12/31/2022   PROT 6.4 12/31/2022   ALBUMIN 3.8 07/18/2016   CALCIUM 9.5 12/31/2022   GFRAA 73 08/17/2020   QFTBGOLDPLUS NEGATIVE 12/31/2022    Speciality Comments: Henderson [place] started 08/24/20  Procedures:  No procedures performed Allergies: Apremilast, Bee venom, Vancomycin, Aspirin, Cephalosporins, Clarithromycin, Clindamycin/lincomycin, Demerol, E-mycin [erythromycin base], Etodolac, Gatifloxacin, Iodinated contrast media, Levaquin  [levofloxacin in d5w], Levofloxacin, Levofloxacin, Lyrica [pregabalin], Meperidine, Morphine and codeine, Nsaids, Phenothiazines, Shellfish-derived products, Tequin, Codeine, Penicillins, and Sulfa drugs cross reactors   Assessment / Plan:     Visit Diagnoses: Psoriatic arthropathy (HCC)  High risk medication use  Other psoriasis  Primary  osteoarthritis of both hands  History of total right knee replacement  Above knee amputation of left lower extremity (HCC)  Age-related osteoporosis without current pathological fracture  Fibromyalgia  Primary insomnia  Contracture of hand joint, right  History of fracture of tibia  History of fibula fracture  Dyslipidemia  History of diabetes mellitus  Orders: No orders of the defined types were placed in this encounter.  No orders of the defined types were placed in this encounter.   Face-to-face time spent with patient was *** minutes. Greater than 50% of time was spent in counseling and coordination of care.  Follow-Up Instructions: No follow-ups on file.   Gearldine Bienenstock, PA-C  Note - This record has been created using Dragon software.  Chart creation errors have been sought, but may not always  have been located. Such creation errors do not reflect on  the standard of medical care.

## 2023-03-05 ENCOUNTER — Other Ambulatory Visit: Payer: Self-pay

## 2023-03-05 ENCOUNTER — Other Ambulatory Visit: Payer: Self-pay | Admitting: Physician Assistant

## 2023-03-05 DIAGNOSIS — L408 Other psoriasis: Secondary | ICD-10-CM

## 2023-03-05 DIAGNOSIS — Z79899 Other long term (current) drug therapy: Secondary | ICD-10-CM

## 2023-03-05 DIAGNOSIS — L4050 Arthropathic psoriasis, unspecified: Secondary | ICD-10-CM

## 2023-03-05 MED ORDER — TALTZ 80 MG/ML ~~LOC~~ SOAJ
80.0000 mg | SUBCUTANEOUS | 2 refills | Status: DC
Start: 2023-03-05 — End: 2023-05-31
  Filled 2023-03-05: qty 1, 28d supply, fill #0
  Filled 2023-04-02: qty 1, 28d supply, fill #1
  Filled 2023-05-06: qty 1, 28d supply, fill #2

## 2023-03-05 NOTE — Telephone Encounter (Signed)
 Last Fill: 12/03/2022  Labs: 12/31/2022  CBC WNL Creatinine remains elevated but has improved slightly-1.13 and GFR is low-52.  Avoid NSAIDs.  Could be due to use of lasix  TB Gold: 12/31/2022   TB gold negative   Next Visit: 03/18/2023  Last Visit: 12/10/2022  IK:Ednmpjupr arthropathy   Current Dose per office note 12/10/2022: Taltz  80 mg sq injections once monthly   Okay to refill Taltz ?

## 2023-03-05 NOTE — Progress Notes (Signed)
 Specialty Pharmacy Refill Coordination Note  [name] is a 73 y.o. female contacted today regarding refills of specialty medication(s) Ixekizumab  (Taltz )   Patient requested Delivery   Delivery date: 03/12/23   Verified address: [address] [place]   Medication will be filled on 01.20.25.   Refill request pending - notify patient if delayed.

## 2023-03-11 ENCOUNTER — Other Ambulatory Visit: Payer: Self-pay

## 2023-03-18 ENCOUNTER — Ambulatory Visit: Payer: 59 | Admitting: Physician Assistant

## 2023-03-18 DIAGNOSIS — M797 Fibromyalgia: Secondary | ICD-10-CM

## 2023-03-18 DIAGNOSIS — L4050 Arthropathic psoriasis, unspecified: Secondary | ICD-10-CM

## 2023-03-18 DIAGNOSIS — Z96651 Presence of right artificial knee joint: Secondary | ICD-10-CM

## 2023-03-18 DIAGNOSIS — F5101 Primary insomnia: Secondary | ICD-10-CM

## 2023-03-18 DIAGNOSIS — M810 Age-related osteoporosis without current pathological fracture: Secondary | ICD-10-CM

## 2023-03-18 DIAGNOSIS — Z8639 Personal history of other endocrine, nutritional and metabolic disease: Secondary | ICD-10-CM

## 2023-03-18 DIAGNOSIS — E785 Hyperlipidemia, unspecified: Secondary | ICD-10-CM

## 2023-03-18 DIAGNOSIS — L408 Other psoriasis: Secondary | ICD-10-CM

## 2023-03-18 DIAGNOSIS — S78112A Complete traumatic amputation at level between left hip and knee, initial encounter: Secondary | ICD-10-CM

## 2023-03-18 DIAGNOSIS — Z8781 Personal history of (healed) traumatic fracture: Secondary | ICD-10-CM

## 2023-03-18 DIAGNOSIS — Z79899 Other long term (current) drug therapy: Secondary | ICD-10-CM

## 2023-03-18 DIAGNOSIS — M24541 Contracture, right hand: Secondary | ICD-10-CM

## 2023-03-18 DIAGNOSIS — M19041 Primary osteoarthritis, right hand: Secondary | ICD-10-CM

## 2023-03-25 NOTE — Progress Notes (Signed)
 Office Visit Note  Patient: [name]             Date of Birth: Apr 02, 1950           MRN: [id number]             PCP: Jamal Collin, PA-C Referring: Jamal Collin, PA-C Visit Date: 04/08/2023 Occupation: @GUAROCC @  Subjective:  Medication monitoring   History of Present Illness: [name] is a 73 y.o. female with history of psoriatic arthritis, fibromyalgia, and osteoarthritis.  Patient remains on Taltz 80 mg sq injections once monthly--started 05/14/22.  She continues to tolerate Taltz without any side effects or injection site reactions.  She has not had any recent gaps in therapy.  She is due for her next dose of Taltz on 04/16/2023.  Patient states that she continues to have generalized aching despite taking her pain medications.  She is followed by pain management every 3 to 4 months but has not had any recent medication changes.  Patient denies any new patches of psoriasis at this time. Patient reports that she received her Prolia injection last week and will be due for her next injection in August 2025.  Activities of Daily Living:  Patient reports morning stiffness for several hours.   Patient Reports nocturnal pain.  Difficulty dressing/grooming: Denies Difficulty climbing stairs: Reports Difficulty getting out of chair: Reports Difficulty using hands for taps, buttons, cutlery, and/or writing: Denies  Review of Systems  Constitutional:  Positive for fatigue.  HENT:  Positive for sore throat, mouth dryness and nose dryness. Negative for mouth sores.   Eyes:  Positive for dryness. Negative for pain.  Respiratory:  Negative for shortness of breath and difficulty breathing.   Cardiovascular:  Negative for chest pain and palpitations.  Gastrointestinal:  Negative for blood in stool, constipation and diarrhea.  Endocrine: Negative for increased urination.  Genitourinary:  Negative for involuntary urination.  Musculoskeletal:  Positive for joint pain, joint  pain, joint swelling, myalgias, muscle weakness, morning stiffness, muscle tenderness and myalgias.  Skin:  Negative for color change, rash, hair loss and sensitivity to sunlight.  Allergic/Immunologic: Negative for susceptible to infections.  Neurological:  Negative for dizziness and headaches.  Hematological:  Negative for swollen glands.  Psychiatric/Behavioral:  Positive for sleep disturbance. Negative for depressed mood. The patient is not nervous/anxious.     PMFS History:  Patient Active Problem List   Diagnosis Date Noted   Dyslipidemia 10/16/2016   History of total knee replacement, bilateral 10/16/2016   Primary osteoarthritis of both hands 10/16/2016   History of diabetes mellitus 10/16/2016   Other chronic pain 10/16/2016   History of glaucoma 10/16/2016   Psoriatic arthropathy (HCC) 07/06/2016   Other psoriasis 07/06/2016   High risk medications (not anticoagulants) long-term use 07/06/2016   Fibromyalgia 07/06/2016   Chronic fatigue 07/06/2016   Primary insomnia 07/06/2016    Past Medical History:  Diagnosis Date   Arthritis    Cataract    Complication of anesthesia    AKI post left TKA, required dialysis x 3 week, 2005   Diabetes mellitus without complication (HCC)    Glaucoma    Hypertension    Psoriatic arthritis (HCC)    Vertigo     Family History  Problem Relation Age of Onset   Cancer Sister    Diabetes Sister    COPD Brother    Diabetes Brother    Past Surgical History:  Procedure Laterality Date   ABDOMINAL HYSTERECTOMY  APPENDECTOMY     CARPAL TUNNEL RELEASE     cataract surgery Bilateral    CHOLECYSTECTOMY     FEMUR FRACTURE SURGERY Right    FIBULA FRACTURE SURGERY Left 09/01/2018   LEG AMPUTATION Left 03/25/2020   REPLACEMENT TOTAL KNEE BILATERAL     TIBIA FRACTURE SURGERY Left 09/01/2018   TOOTH EXTRACTION N/A 08/10/2022   Procedure: EXTRACTION TEETH NUMBER THREE, SIX, EIGHT, NINE, TEN, ELEVEN, TWELVE, THIRTEEN, FOURTEEN, FIFTEEN,  NINETEEN, TWENTY-ONE, TWENTY-TWO, TWENTY-THREE, TWENTY-FOUR, TWENTY-FIVE, TWENTY-SIX, TWENTY-SEVEN, TWENTY-EIGHT, THIRTY, ALVEOLOPLASTY;  Surgeon: Ocie Doyne, DMD;  Location: [facility] OR;  Service: Oral Surgery;  Laterality: N/A;   TOTAL SHOULDER ARTHROPLASTY     TRIGGER FINGER RELEASE     TRIGGER FINGER RELEASE Right 02/2017   3rd and 4th digits.   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  Constitutional:      Appearance: She is well-developed.  HENT:     Head: Normocephalic and atraumatic.  Eyes:     Conjunctiva/sclera: Conjunctivae normal.  Cardiovascular:     Rate and Rhythm: Normal rate and regular rhythm.     Heart sounds: Normal heart sounds.  Pulmonary:     Effort: Pulmonary effort is normal.     Breath sounds: Normal breath sounds.  Abdominal:     General: Bowel sounds are normal.     Palpations: Abdomen is soft.  Musculoskeletal:     Cervical back: Normal range of motion.  Lymphadenopathy:     Cervical: No cervical adenopathy.  Skin:    General: Skin is warm and dry.     Capillary Refill: Capillary refill takes less than 2 seconds.  Neurological:     Mental Status: She is alert and oriented to person, place, and time.  Psychiatric:        Behavior: Behavior normal.      Musculoskeletal Exam: Patient remained seated in the motorized wheelchair today.  C-spine is limited range of motion with lateral rotation.  Thoracic and ptosis noted.  Some discomfort and stiffness with range of motion of both shoulders.  Elbow joints, wrist joints, MCPs, PIPs, DIPs have good range of motion  with no synovitis.  PIP and DIP thickening consistent with osteoarthritis of both hands.  Dupuytren's contracture affecting right 4th digit.  Above-the-knee amputation of the left leg.  Right knee replacement has good range of motion.  CDAI Exam: CDAI Score: -- Patient Global: --; Provider Global: -- Swollen: --; Tender: -- Joint Exam 04/08/2023   No joint exam has been documented for this visit   There is currently no information documented on the homunculus. Go to the Rheumatology activity and complete the homunculus joint exam.  Investigation: No additional findings.  Imaging: No results found.  Recent Labs: Lab Results  Component Value Date   WBC 9.1 12/31/2022   HGB 12.9 12/31/2022   PLT 232 12/31/2022   NA 141 12/31/2022   K 5.3 12/31/2022   CL 102 12/31/2022   CO2 33 (H) 12/31/2022   GLUCOSE 108 (H) 12/31/2022   BUN 17 12/31/2022  CREATININE 1.13 (H) 12/31/2022   BILITOT 0.4 12/31/2022   ALKPHOS 70 07/18/2016   AST 13 12/31/2022   ALT 12 12/31/2022   PROT 6.4 12/31/2022   ALBUMIN 3.8 07/18/2016   CALCIUM 9.5 12/31/2022   GFRAA 73 08/17/2020   QFTBGOLDPLUS NEGATIVE 12/31/2022    Speciality Comments: [place] started 08/24/20  Procedures:  No procedures performed Allergies: Apremilast, Bee venom, Vancomycin, Aspirin, Cephalosporins, Clarithromycin, Clindamycin/lincomycin, Demerol, E-mycin [erythromycin base], Etodolac, Gatifloxacin, Iodinated contrast media, Levaquin  [levofloxacin in d5w], Levofloxacin, Levofloxacin, Lyrica [pregabalin], Meperidine, Morphine and codeine, Nsaids, Phenothiazines, Shellfish-derived products, Tequin, Codeine, Penicillins, and Sulfa drugs cross reactors     Assessment / Plan:     Visit Diagnoses: Psoriatic arthropathy (HCC) - She has no synovitis or dactylitis on examination today.  Patient continues to have generalized arthralgias and joint stiffness particularly with colder weather temperatures.  She remains on Taltz 80 mg subcu  days injections once monthly.  She is tolerating Taltz without any side effects or injection site reactions.  She has not had any recent gaps in therapy and is due for her next injection on 04/16/2023.  No new patches of psoriasis noted.  Plan to check sed rate and CRP today since she continues to have generalized arthralgias and intermittent swelling in her hands.  Offered to schedule ultrasound of both hands to assess for synovitis but she has declined at this time.  Patient remain on Taltz as prescribed.  She was advised to notify us if she develops any new or worsening symptoms.  She will follow-up in the office in 5 months or sooner if needed.  Plan: Sedimentation rate, C-reactive protein  Other psoriasis: No new or active patches of psoriasis at this time.   High risk medication use - Taltz 80 mg sq inj monthly--started 05/14/22.Previous: MTX (LFTs 2017), Remicade-rxn, Enbrel, humira, otezla, cosentyx,Allergy to sulfa,Otezla-dysphagia. CBC and CMP updated on 12/31/22. Orders for CBC and CMP released today.  TB gold negative on 12/31/22.  No recent or recurrent infections.  Discussed the importance of holding taltz if she develops signs or symptoms of an infection and to resume once the infection has completely cleared.   - Plan: COMPLETE METABOLIC PANEL WITH GFR, CBC with Differential/Platelet  Primary osteoarthritis of both hands: She continues to experience intermittent pain in both hands and has noticed intermittent swelling.  On examination today no synovitis or dactylitis was noted.  Plan to check sed rate and CRP today.  Offered to schedule ultrasound to assess for synovitis but she would like to hold off at this time.  History of total right knee replacement: Doing well.  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   Age-related osteoporosis without current pathological fracture - DEXA 09/07/21: Lumbar spine BMD 1.1218  T-score 1.6.  LFN T-score -1.4, right forearm T-score -1.7.  She remains on Prolia 60 mg sq q50months-most recent dose administered last week.   She has been going to chair yoga once a week and several sneakers twice a week for exercise.  Fibromyalgia: Patient continues to experience generalized myalgias and arthralgias.  Generalized hyperalgesia and positive tender points noted on exam.  She is under the care of pain management every 3 to 4 months.  She remains on Belbuca, Cymbalta, and gabapentin as prescribed.    Other medical conditions are listed as follows:   Primary insomnia  Contracture of hand joint, right: Dupuytren's contracture--considering establishing care with a hand specialist to discuss  treatment options  History of fracture of tibia  History of fibula fracture  Dyslipidemia  History of diabetes mellitus  Orders: Orders Placed This Encounter  Procedures   COMPLETE METABOLIC PANEL WITH GFR   CBC with Differential/Platelet   Sedimentation rate   C-reactive protein   No orders of the defined types were placed in this encounter.    Follow-Up Instructions: Return in about 5 months (around 09/05/2023) for Psoriatic arthritis, Osteoarthritis, Fibromyalgia.   Gearldine Bienenstock, PA-C  Note - This record has been created using Dragon software.  Chart creation errors have been sought, but may not always  have been located. Such creation errors do not reflect on  the standard of medical care.

## 2023-04-01 ENCOUNTER — Ambulatory Visit: Payer: 59 | Admitting: Physician Assistant

## 2023-04-02 ENCOUNTER — Other Ambulatory Visit: Payer: Self-pay

## 2023-04-02 NOTE — Progress Notes (Signed)
Specialty Pharmacy Refill Coordination Note  [name] is a 73 y.o. female contacted today regarding refills of specialty medication(s) Ixekizumab Altamease Oiler)   Patient requested Delivery   Delivery date: 04/10/23   Verified address: [address]   [place] [address]   Medication will be filled on 04/09/23.

## 2023-04-08 ENCOUNTER — Other Ambulatory Visit: Payer: Self-pay

## 2023-04-08 ENCOUNTER — Encounter: Payer: Self-pay | Admitting: Physician Assistant

## 2023-04-08 ENCOUNTER — Ambulatory Visit: Payer: 59 | Attending: Physician Assistant | Admitting: Physician Assistant

## 2023-04-08 VITALS — BP 109/67 | HR 73 | Resp 13 | Ht 66.5 in | Wt 232.2 lb

## 2023-04-08 DIAGNOSIS — Z79899 Other long term (current) drug therapy: Secondary | ICD-10-CM

## 2023-04-08 DIAGNOSIS — M24541 Contracture, right hand: Secondary | ICD-10-CM

## 2023-04-08 DIAGNOSIS — L408 Other psoriasis: Secondary | ICD-10-CM

## 2023-04-08 DIAGNOSIS — L4050 Arthropathic psoriasis, unspecified: Secondary | ICD-10-CM | POA: Diagnosis not present

## 2023-04-08 DIAGNOSIS — M19042 Primary osteoarthritis, left hand: Secondary | ICD-10-CM

## 2023-04-08 DIAGNOSIS — M810 Age-related osteoporosis without current pathological fracture: Secondary | ICD-10-CM

## 2023-04-08 DIAGNOSIS — M797 Fibromyalgia: Secondary | ICD-10-CM

## 2023-04-08 DIAGNOSIS — M19041 Primary osteoarthritis, right hand: Secondary | ICD-10-CM | POA: Diagnosis not present

## 2023-04-08 DIAGNOSIS — Z8781 Personal history of (healed) traumatic fracture: Secondary | ICD-10-CM

## 2023-04-08 DIAGNOSIS — F5101 Primary insomnia: Secondary | ICD-10-CM

## 2023-04-08 DIAGNOSIS — Z96651 Presence of right artificial knee joint: Secondary | ICD-10-CM

## 2023-04-08 DIAGNOSIS — S78112A Complete traumatic amputation at level between left hip and knee, initial encounter: Secondary | ICD-10-CM

## 2023-04-08 DIAGNOSIS — E785 Hyperlipidemia, unspecified: Secondary | ICD-10-CM

## 2023-04-08 DIAGNOSIS — Z8639 Personal history of other endocrine, nutritional and metabolic disease: Secondary | ICD-10-CM

## 2023-04-08 NOTE — Progress Notes (Signed)
 Patient was contacted via mychart that due to possible impending winter storm, medication will arrive on Tuesday 04/09/23.

## 2023-04-09 ENCOUNTER — Other Ambulatory Visit (HOSPITAL_COMMUNITY): Payer: Self-pay

## 2023-04-09 ENCOUNTER — Telehealth: Payer: Self-pay | Admitting: Rheumatology

## 2023-04-09 LAB — COMPLETE METABOLIC PANEL WITH GFR
AG Ratio: 1.4 (calc) (ref 1.0–2.5)
ALT: 44 U/L — ABNORMAL HIGH (ref 6–29)
AST: 75 U/L — ABNORMAL HIGH (ref 10–35)
Albumin: 3.6 g/dL (ref 3.6–5.1)
Alkaline phosphatase (APISO): 114 U/L (ref 37–153)
BUN: 14 mg/dL (ref 7–25)
CO2: 34 mmol/L — ABNORMAL HIGH (ref 20–32)
Calcium: 9.2 mg/dL (ref 8.6–10.4)
Chloride: 102 mmol/L (ref 98–110)
Creat: 0.94 mg/dL (ref 0.60–1.00)
Globulin: 2.6 g/dL (ref 1.9–3.7)
Glucose, Bld: 140 mg/dL — ABNORMAL HIGH (ref 65–99)
Potassium: 4.8 mmol/L (ref 3.5–5.3)
Sodium: 142 mmol/L (ref 135–146)
Total Bilirubin: 0.4 mg/dL (ref 0.2–1.2)
Total Protein: 6.2 g/dL (ref 6.1–8.1)
eGFR: 64 mL/min/{1.73_m2} (ref >=60)

## 2023-04-09 LAB — CBC WITH DIFFERENTIAL/PLATELET
Absolute Lymphocytes: 2411 {cells}/uL (ref 850–3900)
Absolute Monocytes: 696 {cells}/uL (ref 200–950)
Basophils Absolute: 20 {cells}/uL (ref 0–200)
Basophils Relative: 0.2 %
Eosinophils Absolute: 88 {cells}/uL (ref 15–500)
Eosinophils Relative: 0.9 %
HCT: 43.9 % (ref 35.0–45.0)
Hemoglobin: 14.2 g/dL (ref 11.7–15.5)
MCH: 28.7 pg (ref 27.0–33.0)
MCHC: 32.3 g/dL (ref 32.0–36.0)
MCV: 88.7 fL (ref 80.0–100.0)
MPV: 11.3 fL (ref 7.5–12.5)
Monocytes Relative: 7.1 %
Neutro Abs: 6586 {cells}/uL (ref 1500–7800)
Neutrophils Relative %: 67.2 %
Platelets: 217 10*3/uL (ref 140–400)
RBC: 4.95 10*6/uL (ref 3.80–5.10)
RDW: 12.1 % (ref 11.0–15.0)
Total Lymphocyte: 24.6 %
WBC: 9.8 10*3/uL (ref 3.8–10.8)

## 2023-04-09 LAB — C-REACTIVE PROTEIN: CRP: 5 mg/L (ref <8.0)

## 2023-04-09 LAB — SEDIMENTATION RATE: Sed Rate: 43 mm/h — ABNORMAL HIGH (ref 0–30)

## 2023-04-09 NOTE — Telephone Encounter (Signed)
 Returned call to patient and she wanted to know if Methocarbamol would affect her liver functions. Patient advised it would not.

## 2023-04-09 NOTE — Progress Notes (Signed)
 Glucose is 140.   AST and ALT are elevated--please clarify if she has had any medication changes? Tylenol use? Alcohol use? NSAID use?  ESR remains elevated but has improved.  CRP WNL  CBC WNL

## 2023-04-09 NOTE — Progress Notes (Signed)
 Recommend following up with PCP or if she has a gastroenterologist--for further evaluation of elevation of LFTs.  She should avoid the use of Tylenol.

## 2023-04-09 NOTE — Telephone Encounter (Signed)
 Pt called requesting the nurse(Andrea) to call her. Pt did not go into any other details, just that its about her medicine.

## 2023-05-06 ENCOUNTER — Other Ambulatory Visit: Payer: Self-pay

## 2023-05-06 ENCOUNTER — Other Ambulatory Visit: Payer: Self-pay | Admitting: Pharmacy Technician

## 2023-05-06 NOTE — Progress Notes (Signed)
 Specialty Pharmacy Refill Coordination Note  [name] is a 73 y.o. female contacted today regarding refills of specialty medication(s) Ixekizumab Altamease Oiler)   Patient requested Delivery   Delivery date: 05/09/23   Verified address: [address] [place]   Medication will be filled on 05/08/23.

## 2023-05-08 ENCOUNTER — Other Ambulatory Visit: Payer: Self-pay

## 2023-05-31 ENCOUNTER — Other Ambulatory Visit: Payer: Self-pay | Admitting: Pharmacy Technician

## 2023-05-31 ENCOUNTER — Other Ambulatory Visit: Payer: Self-pay

## 2023-05-31 ENCOUNTER — Other Ambulatory Visit: Payer: Self-pay | Admitting: Physician Assistant

## 2023-05-31 DIAGNOSIS — L4050 Arthropathic psoriasis, unspecified: Secondary | ICD-10-CM

## 2023-05-31 DIAGNOSIS — Z79899 Other long term (current) drug therapy: Secondary | ICD-10-CM

## 2023-05-31 DIAGNOSIS — L408 Other psoriasis: Secondary | ICD-10-CM

## 2023-05-31 NOTE — Progress Notes (Signed)
 Specialty Pharmacy Refill Coordination Note  [name] is a 73 y.o. female contacted today regarding refills of specialty medication(s) Ixekizumab Altamease Oiler)   Patient requested (Patient-Rptd) Delivery   Delivery date: (Patient-Rptd) 06/14/23   Verified address: (Patient-Rptd) [address] [place] [address]   Medication will be filled on 06/13/23  This fill date is pending response to refill request from provider. Left patient voicemail if they have not received fill by intended date they must follow up with pharmacy. Marland Kitchen

## 2023-06-03 ENCOUNTER — Other Ambulatory Visit: Payer: Self-pay

## 2023-06-03 MED ORDER — TALTZ 80 MG/ML ~~LOC~~ SOAJ
80.0000 mg | SUBCUTANEOUS | 2 refills | Status: DC
Start: 2023-06-03 — End: 2023-09-03
  Filled 2023-06-03: qty 1, 28d supply, fill #0
  Filled 2023-07-03: qty 1, 28d supply, fill #1
  Filled 2023-07-26: qty 1, 28d supply, fill #2

## 2023-06-03 NOTE — Telephone Encounter (Signed)
 Last Fill: 03/05/2023  Labs: 05/27/2023 Glucose 222, GFR 54, Total Protein 6, AST 9  TB Gold: 12/31/2022 Neg    Next Visit: 09/09/2023  Last Visit: 04/08/2023  [id number] arthropathy   Current Dose per office note 04/08/2023: Taltz 80 mg sq inj monthly   Okay to refill Taltz?

## 2023-06-07 ENCOUNTER — Other Ambulatory Visit: Payer: Self-pay

## 2023-06-12 ENCOUNTER — Other Ambulatory Visit: Payer: Self-pay

## 2023-07-01 ENCOUNTER — Other Ambulatory Visit: Payer: Self-pay

## 2023-07-03 ENCOUNTER — Other Ambulatory Visit: Payer: Self-pay

## 2023-07-03 NOTE — Progress Notes (Signed)
 Specialty Pharmacy Refill Coordination Note  [name] is a 73 y.o. female contacted today regarding refills of specialty medication(s) Ixekizumab  (Taltz )   Patient requested Delivery   Delivery date: 07/11/23   Verified address: [address] [place] [address]   Medication will be filled on 07/10/23.

## 2023-07-04 ENCOUNTER — Other Ambulatory Visit: Payer: Self-pay

## 2023-07-10 ENCOUNTER — Other Ambulatory Visit (HOSPITAL_COMMUNITY): Payer: Self-pay

## 2023-07-10 ENCOUNTER — Other Ambulatory Visit: Payer: Self-pay

## 2023-07-26 ENCOUNTER — Other Ambulatory Visit: Payer: Self-pay

## 2023-07-26 NOTE — Progress Notes (Signed)
 Specialty Pharmacy Ongoing Clinical Assessment Note  [name] is a 73 y.o. female who is being followed by the specialty pharmacy service for RxSp Psoriatic Arthritis   Patient's specialty medication(s) reviewed today: Ixekizumab  (Taltz )   Missed doses in the last 4 weeks: 0   Patient/Caregiver did not have any additional questions or concerns.   Therapeutic benefit summary: Patient is achieving benefit   Adverse events/side effects summary: No adverse events/side effects   Patient's therapy is appropriate to: Continue    Goals Addressed             This Visit's Progress    Reduce signs and symptoms   On track    Patient is on track. Patient will maintain adherence. Patient reports that her symptoms are "as good as they can be." Still has some occasional pain and stiffness, but tolerable.         Follow up: 12 months  [facility]

## 2023-07-26 NOTE — Progress Notes (Signed)
 Specialty Pharmacy Refill Coordination Note  [name] is a 73 y.o. female contacted today regarding refills of specialty medication(s) Ixekizumab  (Taltz )   Patient requested Delivery   Delivery date: 08/06/23   Verified address: [address] [place] [address]   Medication will be filled on 08/05/23.

## 2023-08-12 ENCOUNTER — Other Ambulatory Visit: Payer: Self-pay

## 2023-08-26 NOTE — Progress Notes (Signed)
 Office Visit Note  Patient: [name]             Date of Birth: 27-Nov-1950           MRN: [id number]             PCP: Hartwell Area, PA-C Referring: Hartwell Area, PA-C Visit Date: 09/09/2023 Occupation: @GUAROCC @  Subjective:  Arthralgias   History of Present Illness: [name] is a 73 y.o. female with history of psoriatic arthritis, osteoarthritis, and fibromyalgia.   She remains on Taltz  80 mg sq inj monthly--started 05/14/22.  She is tolerating Taltz  without any side effects or injection site reactions.  She has not had any recent gaps in therapy.  She is due for her next dose of Taltz  on 09/13/2023.  Patient states about 3 to 4 days prior to being due for her injection she notices increased arthralgias and joint stiffness.  Patient states that her symptoms have been most severe involving the left shoulder, right hip, lower back.  She denies any groin pain.  She denies any obvious joint swelling.  She has some soreness in the right hand but states that she has to do most things with her right hand.  Patient has a Dupuytren's contracture but is apprehensive to proceed with surgical intervention since she relies on her right hand.  She has been using Voltaren  gel topically for pain relief and remains on oxycodone  and Belbuca.  She denies active psoriasis at this time. Patient is scheduled for her next fairly injection in August 2025. She denies any recent or recurrent infections.    Activities of Daily Living:  Patient reports morning stiffness for 3-4 minutes.   Patient Reports nocturnal pain.  Difficulty dressing/grooming: Denies Difficulty climbing stairs: Reports Difficulty getting out of chair: Reports Difficulty using hands for taps, buttons, cutlery, and/or writing: Denies  Review of Systems  Constitutional:  Positive for fatigue.  HENT:  Positive for mouth dryness. Negative for mouth sores.   Eyes:  Positive for dryness.  Respiratory:  Negative for  shortness of breath.   Cardiovascular:  Negative for chest pain and palpitations.  Gastrointestinal:  Negative for blood in stool, constipation and diarrhea.  Endocrine: Negative for increased urination.  Genitourinary:  Negative for involuntary urination.  Musculoskeletal:  Positive for joint pain, gait problem, joint pain, joint swelling, myalgias, muscle weakness, morning stiffness, muscle tenderness and myalgias.  Skin:  Negative for color change, rash, hair loss and sensitivity to sunlight.  Allergic/Immunologic: Positive for susceptible to infections.  Neurological:  Positive for dizziness. Negative for headaches.  Hematological:  Negative for swollen glands.  Psychiatric/Behavioral:  Positive for depressed mood and sleep disturbance. The patient is nervous/anxious.     PMFS History:  Patient Active Problem List   Diagnosis Date Noted   Dyslipidemia 10/16/2016   History of total knee replacement, bilateral 10/16/2016   Primary osteoarthritis of both hands 10/16/2016   History of diabetes mellitus 10/16/2016   Other chronic pain 10/16/2016   History of glaucoma 10/16/2016   Psoriatic arthropathy (HCC) 07/06/2016   Other psoriasis 07/06/2016   High risk medications (not anticoagulants) long-term use 07/06/2016   Fibromyalgia 07/06/2016   Chronic fatigue 07/06/2016   Primary insomnia 07/06/2016    Past Medical History:  Diagnosis Date   Arthritis    Cataract    Complication of anesthesia    AKI post left TKA, required dialysis x 3 week, 2005   Diabetes mellitus without complication (HCC)    Glaucoma  Hypertension    Psoriatic arthritis (HCC)    Vertigo     Family History  Problem Relation Age of Onset   Cancer Sister    Diabetes Sister    COPD Brother    Diabetes Brother    Past Surgical History:  Procedure Laterality Date   ABDOMINAL HYSTERECTOMY     APPENDECTOMY     CARPAL TUNNEL RELEASE     cataract surgery Bilateral    CHOLECYSTECTOMY     FEMUR FRACTURE  SURGERY Right    FIBULA FRACTURE SURGERY Left 09/01/2018   LEG AMPUTATION Left 03/25/2020   REPLACEMENT TOTAL KNEE BILATERAL     TIBIA FRACTURE SURGERY Left 09/01/2018   TOOTH EXTRACTION N/A 08/10/2022   Procedure: EXTRACTION TEETH NUMBER THREE, SIX, EIGHT, NINE, TEN, ELEVEN, TWELVE, THIRTEEN, FOURTEEN, FIFTEEN, NINETEEN, TWENTY-ONE, TWENTY-TWO, TWENTY-THREE, TWENTY-FOUR, TWENTY-FIVE, TWENTY-SIX, TWENTY-SEVEN, TWENTY-EIGHT, THIRTY, ALVEOLOPLASTY;  Surgeon: Sheryle Hamilton, DMD;  Location: [facility] OR;  Service: Oral Surgery;  Laterality: N/A;   TOTAL SHOULDER ARTHROPLASTY     TRIGGER FINGER RELEASE     TRIGGER FINGER RELEASE Right 02/2017   3rd and 4th digits.   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  Constitutional:      Appearance: She is well-developed.  HENT:     Head: Normocephalic and atraumatic.  Eyes:     Conjunctiva/sclera: Conjunctivae normal.  Cardiovascular:     Rate and Rhythm: Normal rate and regular rhythm.     Heart sounds: Normal heart sounds.  Pulmonary:     Effort: Pulmonary effort is normal.     Breath sounds: Normal breath sounds.  Abdominal:     General: Bowel sounds are normal.     Palpations: Abdomen is soft.  Musculoskeletal:     Cervical back: Normal range of motion.  Lymphadenopathy:     Cervical: No cervical adenopathy.  Skin:    General: Skin is warm and dry.     Capillary Refill: Capillary refill takes less than 2 seconds.  Neurological:     Mental Status: She is alert and oriented to person, place, and time.  Psychiatric:        Behavior:  Behavior normal.      Musculoskeletal Exam: Patient remained seated in motorized wheelchair.  C-spine has limited range of motion without rotation.  Postural thoracic kyphosis noted.  Shoulder joints have discomfort and stiffness with range of motion bilaterally. Elbow joints, wrist joints, MCPs, PIPs, DIPs have good range of motion with no synovitis.  Dupuytren's contracture affecting the right fourth digit.  Above the knee amputation of the left leg.  Right knee replacement has good range of motion with warmth but no effusion.  CDAI Exam: CDAI Score: -- Patient Global: --; Provider Global: -- Swollen: --; Tender: -- Joint Exam 09/09/2023   No joint exam has been documented for this visit   There is currently no information documented on the homunculus. Go to the Rheumatology activity and complete the homunculus joint exam.  Investigation: No additional findings.  Imaging: No results found.  Recent Labs:  Lab Results  Component Value Date   WBC 9.8 04/08/2023   HGB 14.2 04/08/2023   PLT 217 04/08/2023   NA 142 04/08/2023   K 4.8 04/08/2023   CL 102 04/08/2023   CO2 34 (H) 04/08/2023   GLUCOSE 140 (H) 04/08/2023   BUN 14 04/08/2023   CREATININE 0.94 04/08/2023   BILITOT 0.4 04/08/2023   ALKPHOS 70 07/18/2016   AST 75 (H) 04/08/2023   ALT 44 (H) 04/08/2023   PROT 6.2 04/08/2023   ALBUMIN  3.8 07/18/2016   CALCIUM 9.2 04/08/2023   GFRAA 73 08/17/2020   QFTBGOLDPLUS NEGATIVE 12/31/2022    Speciality Comments: Otezla  started 08/24/20  Procedures:  No procedures performed Allergies: Apremilast , Bee venom, Vancomycin, Aspirin, Cephalosporins, Clarithromycin, Clindamycin/lincomycin, Demerol, E-mycin [erythromycin base], Etodolac, Gatifloxacin, Iodinated contrast media, Levaquin  [levofloxacin in d5w], Levofloxacin, Levofloxacin, Lyrica [pregabalin], Meperidine, Morphine and codeine, Nsaids, Phenothiazines, Shellfish-derived products, Tequin, Codeine, Penicillins, and Sulfa  drugs cross reactors   Assessment / Plan:     Visit Diagnoses: Psoriatic arthropathy (HCC): No synovitis or dactylitis noted on examination today.  No recent flares of psoriasis.  She continues to have chronic pain and intermittent joint stiffness.  She typically has increased arthralgias 3 to 4 days prior to administering her Taltz  injections on a monthly basis.  She is due for her next Taltz  dose on 09/13/2023.  She is currently having increased discomfort in her left shoulder, right hip, and her lower back.  No active inflammation noted on examination today. CRP within normal limits on 04/08/2023 and sed rate remain borderline elevated but had improved. She has not had any recent gaps in therapy.  She remains on oxycodone  and Belbuca for pain relief.  She is also taking Cymbalta and gabapentin as prescribed. Discussed that from bimzelx could be a future treatment option if needed. She will remain on Taltz  as prescribed.  She was advised to notify us  if she develops any signs or symptoms of a flare.  She will follow-up in the office in 5 months or sooner if needed.  Other psoriasis: No active psoriasis at this time.  High risk medication use - Taltz  80 mg sq inj monthly--started 05/14/22.Previous: MTX (LFTs 2017), Remicade -rxn, Enbrel, humira, otezla , cosentyx ,Allergy to sulfa,Otezla -dysphagia.  Renal function panel 08/12/23. Orders for CBC and CMP released today. TB gold negative on 12/31/22.   No recent or recurrent infections. Discussed the importance of holding taltz  if she develops signs or symptoms of an infection and to resume once the infection has completely cleared.  - Plan: CBC with Differential/Platelet, Comprehensive metabolic panel with GFR, QuantiFERON-TB Gold Plus  Screening for tuberculosis - Future order for TB gold placed today. Plan: QuantiFERON-TB Gold Plus  Primary osteoarthritis of both hands: PIP and DIP thickening noted.  She has tenderness over the right 2nd and 3rd MCP  joints but no synovitis or dactylitis noted.  Complete fist formation bilaterally.  Dupuytren's contracture affecting the right fourth digit.  Discussed the importance of joint protection and muscle strengthening.  History of total right knee replacement: Intermittent discomfort.  Warmth but no effusion noted on exam.  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  Age-related osteoporosis without current pathological fracture - DEXA 09/07/21: Lumbar spine BMD 1.1218 T-score 1.6.  LFN T-score -1.4, right forearm T-score -1.7. She remains on Prolia 60 mg sq q75months--due in August 2025.  She is taking vitamin D 1000 units daily.  Fibromyalgia: She continues to have intermittent myalgias and muscle tenderness due  to fibromyalgia.  She remains on Cymbalta and gabapentin as prescribed.  She is under the care of pain management and is taking Belbuca and oxycodone  for pain relief.  Primary insomnia  Contracture of hand joint, right: Dupuytren's contracture affecting the right fourth digit.  She is apprehensive to proceed with surgical intervention since she relies on her right hand for ADLs.  Other medical conditions are listed as follows:   History of fracture of tibia  History of fibula fracture  Dyslipidemia  History of diabetes mellitus   Orders: Orders Placed This Encounter  Procedures   CBC with Differential/Platelet   Comprehensive metabolic panel with GFR   QuantiFERON-TB Gold Plus   No orders of the defined types were placed in this encounter.    Follow-Up Instructions: Return in about 5 months (around 02/09/2024) for Psoriatic arthritis, Osteoarthritis, Fibromyalgia.   Waddell CHRISTELLA Craze, PA-C  Note - This record has been created using Dragon software.  Chart creation errors have been sought, but may not always  have been located. Such creation errors do not reflect on  the standard of medical care.

## 2023-09-03 ENCOUNTER — Other Ambulatory Visit: Payer: Self-pay

## 2023-09-03 ENCOUNTER — Other Ambulatory Visit: Payer: Self-pay | Admitting: Physician Assistant

## 2023-09-03 DIAGNOSIS — L4050 Arthropathic psoriasis, unspecified: Secondary | ICD-10-CM

## 2023-09-03 DIAGNOSIS — Z79899 Other long term (current) drug therapy: Secondary | ICD-10-CM

## 2023-09-03 DIAGNOSIS — L408 Other psoriasis: Secondary | ICD-10-CM

## 2023-09-03 MED ORDER — TALTZ 80 MG/ML ~~LOC~~ SOAJ
80.0000 mg | SUBCUTANEOUS | 2 refills | Status: DC
  Filled 2023-09-03: qty 1, 28d supply, fill #0
  Filled 2023-09-30: qty 1, 28d supply, fill #1
  Filled 2023-10-31: qty 1, 28d supply, fill #2

## 2023-09-03 NOTE — Progress Notes (Signed)
 Specialty Pharmacy Refill Coordination Note  [name] is a 73 y.o. female contacted today regarding refills of specialty medication(s) Ixekizumab  (Taltz )   Patient requested Delivery   Delivery date: 09/06/23   Verified address: [address] [place] [address]   Medication will be filled on 09/05/23.   This fill date is pending response to refill request from provider. Patient is aware and if they have not received fill by intended date they must follow up with pharmacy.

## 2023-09-03 NOTE — Telephone Encounter (Signed)
 Last Fill: 06/03/2023  Labs: 08/12/2023 Glucose 141, GFR 51 05/27/2023 CBC WNL  TB Gold: 12/31/2022 Neg    Next Visit: 09/09/2023  Last Visit: 04/08/2023  IK:Ednmpjupr arthropathy   Current Dose per office note 04/08/2023: Taltz  80 mg sq inj monthly   Patient to update labs at upcoming appointment on 09/09/2023.  Okay to refill Taltz ?

## 2023-09-04 ENCOUNTER — Other Ambulatory Visit: Payer: Self-pay

## 2023-09-09 ENCOUNTER — Ambulatory Visit: Payer: 59 | Attending: Physician Assistant | Admitting: Physician Assistant

## 2023-09-09 ENCOUNTER — Encounter: Payer: Self-pay | Admitting: Physician Assistant

## 2023-09-09 VITALS — BP 97/62 | HR 74 | Resp 13 | Ht 67.0 in | Wt 222.0 lb

## 2023-09-09 DIAGNOSIS — F5101 Primary insomnia: Secondary | ICD-10-CM

## 2023-09-09 DIAGNOSIS — M797 Fibromyalgia: Secondary | ICD-10-CM

## 2023-09-09 DIAGNOSIS — Z8639 Personal history of other endocrine, nutritional and metabolic disease: Secondary | ICD-10-CM

## 2023-09-09 DIAGNOSIS — Z79899 Other long term (current) drug therapy: Secondary | ICD-10-CM | POA: Diagnosis not present

## 2023-09-09 DIAGNOSIS — M24541 Contracture, right hand: Secondary | ICD-10-CM

## 2023-09-09 DIAGNOSIS — Z111 Encounter for screening for respiratory tuberculosis: Secondary | ICD-10-CM

## 2023-09-09 DIAGNOSIS — M19042 Primary osteoarthritis, left hand: Secondary | ICD-10-CM

## 2023-09-09 DIAGNOSIS — M19041 Primary osteoarthritis, right hand: Secondary | ICD-10-CM

## 2023-09-09 DIAGNOSIS — Z8781 Personal history of (healed) traumatic fracture: Secondary | ICD-10-CM

## 2023-09-09 DIAGNOSIS — L4050 Arthropathic psoriasis, unspecified: Secondary | ICD-10-CM | POA: Diagnosis not present

## 2023-09-09 DIAGNOSIS — M810 Age-related osteoporosis without current pathological fracture: Secondary | ICD-10-CM

## 2023-09-09 DIAGNOSIS — L408 Other psoriasis: Secondary | ICD-10-CM

## 2023-09-09 DIAGNOSIS — Z96651 Presence of right artificial knee joint: Secondary | ICD-10-CM

## 2023-09-09 DIAGNOSIS — S78112A Complete traumatic amputation at level between left hip and knee, initial encounter: Secondary | ICD-10-CM

## 2023-09-09 DIAGNOSIS — E785 Hyperlipidemia, unspecified: Secondary | ICD-10-CM

## 2023-09-09 LAB — COMPREHENSIVE METABOLIC PANEL WITH GFR
AG Ratio: 1.4 (calc) (ref 1.0–2.5)
ALT: 8 U/L (ref 6–29)
AST: 8 U/L — ABNORMAL LOW (ref 10–35)
Albumin: 3.6 g/dL (ref 3.6–5.1)
Alkaline phosphatase (APISO): 68 U/L (ref 37–153)
BUN: 17 mg/dL (ref 7–25)
CO2: 30 mmol/L (ref 20–32)
Calcium: 9.7 mg/dL (ref 8.6–10.4)
Chloride: 103 mmol/L (ref 98–110)
Creat: 0.97 mg/dL (ref 0.60–1.00)
Globulin: 2.5 g/dL (ref 1.9–3.7)
Glucose, Bld: 194 mg/dL — ABNORMAL HIGH (ref 65–99)
Potassium: 5 mmol/L (ref 3.5–5.3)
Sodium: 140 mmol/L (ref 135–146)
Total Bilirubin: 0.4 mg/dL (ref 0.2–1.2)
Total Protein: 6.1 g/dL (ref 6.1–8.1)
eGFR: 62 mL/min/1.73m2 (ref >=60)

## 2023-09-09 LAB — CBC WITH DIFFERENTIAL/PLATELET
Absolute Lymphocytes: 2812 {cells}/uL (ref 850–3900)
Absolute Monocytes: 639 {cells}/uL (ref 200–950)
Basophils Absolute: 21 {cells}/uL (ref 0–200)
Basophils Relative: 0.2 %
Eosinophils Absolute: 165 {cells}/uL (ref 15–500)
Eosinophils Relative: 1.6 %
HCT: 42.7 % (ref 35.0–45.0)
Hemoglobin: 13.4 g/dL (ref 11.7–15.5)
MCH: 28.3 pg (ref 27.0–33.0)
MCHC: 31.4 g/dL — ABNORMAL LOW (ref 32.0–36.0)
MCV: 90.1 fL (ref 80.0–100.0)
MPV: 11.3 fL (ref 7.5–12.5)
Monocytes Relative: 6.2 %
Neutro Abs: 6664 {cells}/uL (ref 1500–7800)
Neutrophils Relative %: 64.7 %
Platelets: 207 Thousand/uL (ref 140–400)
RBC: 4.74 Million/uL (ref 3.80–5.10)
RDW: 12.5 % (ref 11.0–15.0)
Total Lymphocyte: 27.3 %
WBC: 10.3 Thousand/uL (ref 3.8–10.8)

## 2023-09-09 NOTE — Patient Instructions (Signed)
 Standing Labs We placed an order today for your standing lab work.   Please have your standing labs drawn in October and every 3 months   Please have your labs drawn 2 weeks prior to your appointment so that the provider can discuss your lab results at your appointment, if possible.  Please note that you may see your imaging and lab results in MyChart before we have reviewed them. We will contact you once all results are reviewed. Please allow our office up to 72 hours to thoroughly review all of the results before contacting the office for clarification of your results.  WALK-IN LAB HOURS  Monday through Thursday from 8:00 am -12:30 pm and 1:00 pm-4:30 pm and Friday from 8:00 am-12:00 pm.  Patients with office visits requiring labs will be seen before walk-in labs.  You may encounter longer than normal wait times. Please allow additional time. Wait times may be shorter on  Monday and Thursday afternoons.  We do not book appointments for walk-in labs. We appreciate your patience and understanding with our staff.   Labs are drawn by [facility]. Please bring your co-pay at the time of your lab draw.  You may receive a bill from [facility] for your lab work.  Please note if you are on Hydroxychloroquine and and an order has been placed for a Hydroxychloroquine level,  you will need to have it drawn 4 hours or more after your last dose.  If you wish to have your labs drawn at another location, please call the office 24 hours in advance so we can fax the orders.  The office is located at [address], [place], [place] [address]   If you have any questions regarding directions or hours of operation,  please call [phone].   As a reminder, please drink plenty of water  prior to coming for your lab work. Thanks!

## 2023-09-10 ENCOUNTER — Ambulatory Visit: Payer: Self-pay | Admitting: Physician Assistant

## 2023-09-10 NOTE — Progress Notes (Signed)
 CBC WNL. Glucose is elevated-194.  Rest of CMP WNL

## 2023-09-30 ENCOUNTER — Other Ambulatory Visit: Payer: Self-pay

## 2023-09-30 ENCOUNTER — Other Ambulatory Visit: Payer: Self-pay | Admitting: Pharmacy Technician

## 2023-09-30 NOTE — Progress Notes (Signed)
 Specialty Pharmacy Refill Coordination Note  [name] is a 73 y.o. female contacted today regarding refills of specialty medication(s) Ixekizumab  (Taltz )   Patient requested Delivery   Delivery date: 10/09/23   Verified address: [address] [place]   Medication will be filled on 10/08/23.

## 2023-10-23 NOTE — Progress Notes (Signed)
 [facility] [place]  Name: [name] Date of Service:10/23/2023 MRN: [id number] DOB: 1950/08/10  BH Follow  Subjective   10/23/23 Says that she has been shaky and nervous. Feels more restless and jittery. Says she wakes up feeling nervous. Says she doesn't want to be by herself. Feels stuck since her sister moved out. Describes a sense of impending doom.   Endorses 1-2 panic attacks since last visit. Called her SIL and daughter during episodes to talk me through.   Says she watches tv, reads, and does word searches for distraction. Sees her sister in law for social interactions.  Says once I get to sleep it's usually okay. Is able to fall back asleep if she wakes up.     09/04/23 Says sister is moving out in the middle of August. States she sprung it on me. Told patient she can't live with me anymore. She needs her own space. Endorses worry about being by herself. Says she hasn't been by herself in a long time. Says when nobody is around I will let my mind wander about what could happen. Get's nervous about falling/needing help.   Goes to fitness center a few days weekly w/ sister-in-law. Enjoys this. Has gone to church a few times (son-in-law is a Programmer, multimedia).   Says she takes 1/2-1 tablet of Ativan, as needed, for severe anxiety. Doesn't take pain medication if she has taken Ativan. Reserves for extreme cases. Says it helps.     07/24/23 Says that she has had some rough days since our last visit. Reports waking up feeling really nervous and jittery on 5/10. Says that she took an Ativan and it settled down. Reports having 2-3 days since then with elevated anxiety and nervousness. Says otherwise symptoms have been relatively stable. Says her sons have been doing better and controlling their attitudes and tempers.   Says that her brother-in-laws 20 year old daughter, niece, and niece's dog recently moved in. Reports this has been an  added stressor. There are 7 living in the house now.   Denies depressive symptoms.   Has lost 25 pounds since starting Ozempic. Needs to get new dentures since losing weight. Says she has an appointment next week.   Per Initial Consult 05/16/2021    Chief Complaint: Anxiety, depression  History of Present Illness:  [name] is a 73 year old female with a past medical history of anxiety, depression, DM2, s/p AKA of left extremity, anemia, chronic pain on opioid treatment, HTN, HLD, fibromyalgia, and obesity who presents to the clinic to establish care as a new patient.  Endorses worsening anxiety and mood symptoms over the last year following her above-knee amputation on March 25, 2020.  Endorses impaired mobility and loss of autonomy as primary contributing factors.  Says that she is not able to take care of things around the house and drive herself places as she used to.  Is having to depend on others and endorses difficulty with this reality.  Says that she hates having to ask for help and feels anxious when needing to do so.  Hesitates to ask multiple times as she has been called a nag.  At present endorses some anxiety with excessive worry and increased irritability.  Says that finances, tension in the home with family, and inability to keep the house clean are aggravating factors.  Endorses history of panic attacks.  At present says that she has antsy episodes approximately twice monthly during which she experiences shakiness.  Says that they often occur when she is left home alone.  Endorses depressive episodes over the last year.  During these says she experiences decreased motivation, loss of interest, and fatigue.  Says at present she is feeling a little bit better.  Reports that one of her medications was increased a few weeks ago and believes this may be helping.  Denies history of SI.  Enjoys watching TV, reading, doing word searches, and playing games on her phone.  Has been  attending church virtually.  Says that she would like to be able to go in person but does not have transportation.  Denies manic symptoms including prolonged periods of sleep absence with elevated mood, irritability, restlessness, racing thoughts, increased goal-oriented behaviors, risk taking, feelings of grandiosity, and rapid speech. No delusions or psychosis.  Denies alcohol, tobacco, and drug use.  Current suicidal/homicidal ideations: No Current auditory/visual hallucinations: No  Past Psychiatric History    Previous diagnoses: autism and depression  Previous psychiatric medication trials: Cymbalta, BuSpar  Past suicidal/homicidal ideation/attempt: Denies  Previous psychiatric hospitalizations/Rehab: Denies  Past Medical History   Patient Active Problem List  Diagnosis  . Essential hypertension  . Mixed hyperlipidemia  . Type 2 diabetes mellitus with hyperglycemia, with long-term current use of insulin  (HCC)  . Asthma  . Chronic pain syndrome  . Medication management  . Fibromyalgia  . Glaucoma  . High risk medications (not anticoagulants) long-term use  . History of total knee replacement, bilateral  . Left ear hearing loss  . Multinodular goiter  . Multiple thyroid nodules  . Other psoriasis  . Primary insomnia  . Psoriatic arthropathy (HCC)  . At risk for falls  . Type II diabetes mellitus with ophthalmic manifestations, uncontrolled  . Periprosthetic fracture around prosthetic knee, initial encounter  . Senile osteoporosis  . Chronic, continuous use of opioids  . Adjustment disorder with mixed anxiety and depressed mood  . GERD (gastroesophageal reflux disease)  . Normocytic anemia  . Nocturnal hypoxia  . Hypomagnesemia  . Acquired absence of knee joint following explantation of joint prosthesis with presence of antibiotic-impregnated cement spacer  . Status post incision and drainage  . Postoperative anemia due to acute blood loss  . Prolonged QT  interval  . Chronic heart failure with preserved ejection fraction (HCC)  . OSA (obstructive sleep apnea)  . Hypercarbia  . History of sepsis  . Anemia  . Chronic pain  . Anxiety  . AMS (altered mental status)  . Electrolyte abnormality  . Grief reaction  . Murmur  . Above knee amputation of left lower extremity (HCC)  . Impaired mobility and ADLs  . Phantom limb syndrome with pain (HCC)  . Acquired hammer toe of right foot  . Onychomycosis due to dermatophyte  . Type 2 diabetes mellitus with stage 3a chronic kidney disease    (CMD)  . Tinnitus of left ear  . Sensorineural hearing loss (SNHL) of both ears  . Ingrowing toenail of right foot  . RUQ pain  . Hx of cholecystectomy  . Elevated transaminase level  . SI joint arthritis  . Diverticulosis of colon  . Mild nonproliferative diabetic retinopathy of both eyes without macular edema associated with type 2 diabetes mellitus    (CMD)  . Fatty liver  . Hammertoe of right foot   Allergies  Allergen Reactions  . Apremilast  Swelling    Throat swelling, difficulty swallowing  . Prochlorperazine Respiratory Distress  . Promethazine Respiratory Distress  . Vancomycin Anaphylaxis  .  Aspirin GI Intolerance    Other reaction(s): Nausea And Vomiting  . Cephalosporins Other (See Comments)    Tolerated cefazolin & cefepime - suspect documented allergy is inaccurate.  . Clarithromycin Other (See Comments)    Mouth sores  . Clindamycin Other (See Comments)    Sores in mouth  . Codeine GI Intolerance    Other reaction(s): Nausea And Vomiting, , Tolerates Dilaudid  . Emollient Combination No. 10 Other (See Comments)    Cannot recall  . Erythromycin Itching and GI Intolerance  . Ibuprofen Other (See Comments) and GI Intolerance    Uses once in a while  . Meperidine Other (See Comments)    Muscle rigidity, and respiratory distress  . Metformin Other (See Comments)  . Perfume Other (See Comments)    UNKNOWN  . Pregabalin   .  Venom-Honey Bee   . Cefuroxime Axetil Palpitations    palpitations  . Etodolac Rash    HIVES  . Iodinated Contrast Media GI Intolerance and Rash  . Levofloxacin GI Intolerance and Rash  . Nsaids (Non-Steroidal Anti-Inflammatory Drug) Rash    UNKNOWN  . Penicillin Rash and GI Intolerance    Has patient had a PCN reaction causing anaphylaxis, immediate rash, facial/tongue/throat swelling, SOB or lightheadedness with hypotension? NO, Has patient had a PCN reaction causing severe rash involving mucus membranes or skin necrosis?  NO, Has patient had a PCN reaction that required hospitilization?{NO, , Has patient had a PCN reaction occurring within the last 10 years?  NO, If all of the above answers are no then may proceed with cephalosporin use.  SABRA Phenothiazines Rash and Respiratory Distress  . Propoxyphene N-Acetaminophen  GI Intolerance and Rash  . Shellfish Containing Products Rash    UNKNOWN  . Sulfa (Sulfonamide Antibiotics) Rash   Current Outpatient Medications on File Prior to Visit  Medication Sig Dispense Refill  . acetaminophen  (TYLENOL ) 500 mg tablet Take 1,000 mg by mouth every 6 (six) hours as needed (pain).    SABRA albuterol HFA (PROVENTIL HFA;VENTOLIN HFA;PROAIR HFA) 90 mcg/actuation inhaler Inhale 2 puffs daily as needed for wheezing.    SABRA ascorbic acid (VITAMIN C) 500 mg tablet Take 500 mg by mouth 3 (three) times a day with meals.    SABRA azelastine (ASTELIN) 137 mcg (0.1 %) nasal spray 1 spray 2 (two) times a day. 30 mL 1  . blood-glucose meter misc Use daily to check glucose.  DX:  E11.65 1 each 0  . blood-glucose sensor (FreeStyle Libre 3 Plus Sensor) USE FOR CONTINUOUS GLUCOSE MONITORING.  CHANGE EVERY 15 DAYS. 6 each 2  . buprenorphine HCL (Belbuca) 900 mcg film Place 1 each (900 mcg total) into mouth between cheek and gum every 12 (twelve) hours. 60 each 2  . busPIRone (BUSPAR) 30 mg tablet Take 1 tablet (30 mg total) by mouth 2 (two) times a day. 180 tablet 1  . calcium  citrate (CALCITRATE) 950 mg (200 mg calcium) tab Take 950 mg by mouth Once Daily.    . carboxymethylcellulose (Refresh Tears) 0.5 % drop ophthalmic solution Administer 1 drop into each eyes as needed.    . cetirizine (ZyrTEC) 10 mg tablet Take 10 mg by mouth daily as needed.    . clobetasoL  (TEMOVATE ) 0.05 % cream Apply 1 Application topically daily as needed.    . ezetimibe (ZETIA) 10 mg tablet TAKE 1 TABLET BY MOUTH EVERY DAY 90 tablet 1  . furosemide (LASIX) 20 mg tablet TAKE 1 TABLET BY MOUTH EACH DAY AS  NEEDED 90 tablet 0  . gabapentin (NEURONTIN) 400 mg capsule 3 pills a.m., 2 pills p.m., 3 pills nighttime 720 capsule 1  . glucose blood (Accu-Chek Guide test strips) test strip Use 1 times daily as instructed 100 each 3  . hydrOXYzine (ATARAX) 10 mg tablet Indications: anxious 60 tablet 1  . insulin  NPH-insulin  regular (HumuLIN 70/30 U-100 Insulin ) 100 unit/mL (70-30) injection Inject 50 Units under the skin in the morning and 50 Units in the evening. Inject before meals. 90 mL 3  . insulin  syringe-needle U-100 1 mL 31 gauge x 5/16 syrg Use 2 times daily. 200 each 2  . ixekizumab  (Taltz  Autoinjector) 80 mg/mL atIn Inject 80 mg under the skin.    . Lancets (Accu-Chek Softclix Lancets) misc Use 1 times daily as directed. 100 each 3  . Lancets misc Use 3 times daily 300 each 3  . lisinopriL (PRINIVIL) 20 mg tablet TAKE 1 TABLET BY MOUTH EVERY DAY FOR BLOOD PRESSURE 90 tablet 1  . LORazepam (ATIVAN) 0.5 mg tablet Take 1 tablet, daily, as needed for panic attacks 5 tablet 2  . methocarbamoL (ROBAXIN) 500 mg tablet TAKE 1 TABLET BY MOUTH 4 TIMES DAILY 360 tablet 1  . metoprolol tartrate (LOPRESSOR) 25 mg tablet TAKE 1/2 TABLET BY MOUTH TWICE DAILY (START 06/29/23) 90 tablet 0  . miscellaneous medical supply (C-Tub) misc Pt need arm lift for walker to assist with ambulation ability 1 each 0  . miscellaneous medical supply (C-Tub) misc Semi electric hospital bed-patient can independently use hospital  bed controls and make adjustments as needed or has caregiver there to help 1 each 1  . naloxone 1 mg/mL injection Infuse 0.2 mg into a venous catheter as needed. 2 mL 0  . nystatin (Nyamyc) 100,000 unit/gram powder APPLY TOPICALLY TO AFFECTED AREA AS DIRECTED TWICE DAILY 60 each 2  . omeprazole (PriLOSEC) 40 mg DR capsule Take 1 capsule (40 mg total) by mouth before breakfast and before evening meal. 180 capsule 0  . ondansetron  (ZOFRAN -ODT) 8 mg disintegrating tablet DISSOLVE 1 TABLET BY MOUTH EVERY 8 HOURSAS NEEDED FOR NAUSEA 60 tablet 2  . oxyCODONE  (ROXICODONE ) 10 mg tab Take 1 tablet (10 mg total) by mouth 4 (four) times a day. 120 tablet 0  . [START ON 10/26/2023] oxyCODONE  (ROXICODONE ) 10 mg tab Take 1 tablet (10 mg total) by mouth 4 (four) times a day. 120 tablet 0  . Ozempic 1 mg/dose (4 mg/3 mL) subcutaneous pen injector INJECT 1 MG UNDER THE SKIN EVERY 7 DAYS AS DIRECTED 9 mL 1  . rosuvastatin (CRESTOR) 10 mg tablet TAKE 1 TABLET BY MOUTH EVERY DAY FOR CHOLESTEROL 90 tablet 2  . sodium chloride  (Altamist) 0.65 % nasal spray Administer 1 spray into affected nostril(s) as needed (congestion).    . triamcinolone (NASACORT AQ) 55 mcg spra spray Administer 2 sprays into affected nostril(s) Once Daily. 1 each 1  . [DISCONTINUED] DULoxetine (CYMBALTA) 60 mg capsule Take 2 capsules daily 60 capsule 2   No current facility-administered medications on file prior to visit.    Family History   Family History  Problem Relation Name Age of Onset  . Hypertension Mother    . Ovarian cancer Sister    . Cancer Maternal Grandfather    . Fuchs' dystrophy Paternal Grandfather    . Breast cancer Other cousin mat    Social History   Living situation: With her sister, brother-in-law, and 2 sons  Family: Single.  Has 3 children.  Son Medford  and son Dorn live at home.  Daughter Delon lives in [place].  Son Dorn has ASD.  Occupation: On disability.  Former Char[employer] fundraiser.  Social Support:  Family  Access to firearms: Denies  Substance Use History:  Alcohol: Denies Tobacco: Denies Other illicit drug use: Denies  Objective   ROS: All systems negative except pertinent positives documented in HPI.  Physical Exam: There were no vitals filed for this visit.  Lab Review: No visits with results within 2 Month(s) from this visit.  Latest known visit with results is:  Lab on 08/12/2023  Component Date Value  . Vitamin D 25-Hydroxy 08/12/2023 77.4   . Sodium 08/12/2023 139   . Potassium 08/12/2023 4.8   . Chloride 08/12/2023 102   . CO2 08/12/2023 30   . Anion Gap 08/12/2023 7   . Glucose, Random 08/12/2023 141 (H)   . Blood Urea Nitrogen (BUN) 08/12/2023 16   . Creatinine 08/12/2023 1.14   . eGFR 08/12/2023 51 (L)   . Albumin  08/12/2023 3.5   . Calcium 08/12/2023 9.1   . Phosphorus 08/12/2023 3.8   . BUN/Creatinine Ratio 08/12/2023    . Parathyroid Hormone (PTH* 08/12/2023 48     Mental Status Evaluation   Constitutional: General Appearance: Casually dressed General Behavior: Pleasant and cooperative  Musculoskeletal: Gait and Station: In wheelchair Strength and Tone: Not assessed  Psychiatric: Psychomotor Activity: No motor abnormalities Speech: Normal in rate/volume/tone Mood: Anxious and Concerned Affect: Appropriate Thought Process: Linear, logical, and goal directed Associations: Intact Thought Content/Perceptual Disturbances: Denies suicidal/homicidal ideation, auditory/visual hallucinations, delusions, paranoia, and manic symptoms including: feelings of grandiosity, decreased need for sleep with elevated mood, increased risk taking, racing thoughts, and pressured speech Cognition/Sensorium: Orientation intact (AAO x4), Attention intact, and Language normal Insight: Good Judgment: Good   Assessment   Encounter Diagnoses  Name Primary?  . Adjustment disorder with mixed anxiety and depressed mood Yes  . Panic attack    Plan    Medications:  Orders Placed This Encounter  Medications  . sertraline (ZOLOFT) 100 mg tablet    Sig: Take 1/2 tab daily for 1 week then increase to full tab daily    Dispense:  30 tablet    Refill:  2   10/23/2023 Discussed transitioning her from Cymbalta to another medication.  Patient is having a lot of anxiety and is at the maximum dose of Cymbalta.  Has been on this medication for many years.  Is also on highest dose of BuSpar at 30 mg twice daily.  Has also been on this medication for years.  Is experiencing anxiety in the setting of environmental factors and transitions (sister and family moved out, spending more time alone).  Will cross-taper Cymbalta with Zoloft.  Will have her decrease Cymbalta to 60 mg daily for 1 week then stop.  Will initiate Zoloft at 50 mg daily for 1 week and then titrate to 100 mg daily thereafter. Will continue BuSpar 30 mg twice daily for the time being.  Uncertain if this medication is providing much benefit.  In some patients it can worsen nervousness and restlessness (has been on for many years). May continue Ativan 0.5 mg as needed (uses very sparingly, gets #5 monthly) for panic attacks.  Advised of risks of medication.  Advised of possible interactions with her opioid medication.  PDMP reviewed.  09/04/2023 Continue Cymbalta 120mg  daily and Buspar 60mg  daily.   Messaged provider on 7/7 reporting more anxiety attacks.  Says that she was told by her sister  that she is moving out.  Says that she is very nervous about her moving as she will have more time alone.  Fears something happening to her not being able to get help.  Used to have a life alert but got rid of it not long ago.  Says that she has her phone on her at all times.  Asked if she would like us  to help her get a life alert as she may not always be able to reach her phone.  Says she thinks she will be fine.  Her son is home with her most of the day.  Titrated Cymbalta to 120 mg daily on 7/10 to  see if the higher dose would help her anxiety.  Patient will continue this dose until her next visit.  Will message provider if symptoms worsen or fail to improve. May continue BuSpar 30 mg twice daily. May take Ativan 0.5 mg as needed for severe anxiety/panic attacks.  Only filling #5 at a time.  Has 2 refills remaining. Reiterated the risks of this medication with her pain medication.  Knows not to dose together.  Separates by a couple of hours.  Discussed with pain management provider.  Says that they are okay with her having this medication for as needed emergency relief.    Behavioral/Other: Seeing a therapist through [facility] every 2 weeks- Delon Louder.  Finds it helpful.  Follow-up: 4 to 6 weeks  Patient verbalizes understanding and is in agreement with the above plan.  All questions answered.  Medication side effects discussed with patient. Advised patient to call clinic or return for visit if symptoms occur.  Patient contracted for safety.  Knows to call 911, suicide hotline, or present to the ED if suicidal thoughts emerge and/or they develop a plan for self-harm.  Patient denies SI and is linear, logical, and future oriented during today's visit.

## 2023-10-31 ENCOUNTER — Other Ambulatory Visit: Payer: Self-pay | Admitting: Pharmacy Technician

## 2023-10-31 ENCOUNTER — Other Ambulatory Visit: Payer: Self-pay

## 2023-10-31 NOTE — Progress Notes (Signed)
 Specialty Pharmacy Refill Coordination Note  [name] is a 73 y.o. female contacted today regarding refills of specialty medication(s) Ixekizumab  (Taltz )   Patient requested Delivery   Delivery date: 11/06/23   Verified address: [address] [place]   Medication will be filled on 11/05/23.

## 2023-10-31 NOTE — Progress Notes (Signed)
 Clinical Intervention Note  Clinical Intervention Notes: Patient reports starting Zoloft and stopping Cymbalta; no drug-drug interactions identified with Taltz .   Clinical Intervention Outcomes: Prevention of an adverse drug event   Powell CHRISTELLA Gallus Specialty Pharmacist

## 2023-11-26 ENCOUNTER — Other Ambulatory Visit (HOSPITAL_COMMUNITY): Payer: Self-pay

## 2023-12-04 ENCOUNTER — Other Ambulatory Visit: Payer: Self-pay | Admitting: Physician Assistant

## 2023-12-04 ENCOUNTER — Other Ambulatory Visit (HOSPITAL_COMMUNITY): Payer: Self-pay

## 2023-12-04 ENCOUNTER — Other Ambulatory Visit: Payer: Self-pay

## 2023-12-04 DIAGNOSIS — L408 Other psoriasis: Secondary | ICD-10-CM

## 2023-12-04 DIAGNOSIS — Z79899 Other long term (current) drug therapy: Secondary | ICD-10-CM

## 2023-12-04 DIAGNOSIS — L4050 Arthropathic psoriasis, unspecified: Secondary | ICD-10-CM

## 2023-12-04 MED ORDER — TALTZ 80 MG/ML ~~LOC~~ SOAJ
80.0000 mg | SUBCUTANEOUS | 2 refills | Status: DC
  Filled 2023-12-04 – 2023-12-05 (×2): qty 1, 28d supply, fill #0
  Filled 2023-12-30: qty 1, 28d supply, fill #1
  Filled 2024-01-29: qty 1, 28d supply, fill #2

## 2023-12-04 NOTE — Telephone Encounter (Signed)
 Last Fill: 09/03/2023  Labs: 11/25/2023 Glucose 228, Total Protein 5.9, AST 11, Calcium 8.4  TB Gold: 12/31/2022    Next Visit: 02/10/2024  Last Visit: 09/09/2023  IK:Ednmpjupr arthropathy   Current Dose per office note 09/09/2023: Taltz  80 mg sq inj monthly   Okay to refill Taltz ?

## 2023-12-05 ENCOUNTER — Other Ambulatory Visit: Payer: Self-pay

## 2023-12-05 NOTE — Progress Notes (Signed)
 Specialty Pharmacy Refill Coordination Note  [name] is a 73 y.o. female contacted today regarding refills of specialty medication(s) Ixekizumab  (Taltz )   Patient requested Delivery   Delivery date: 12/10/23   Verified address: [address] [place]   Medication will be filled on 12/09/23.

## 2023-12-05 NOTE — Progress Notes (Signed)
 Clinical Intervention Note  Clinical Intervention Notes: Patient reports that she has stopped Zoloft and now taking Lexapro. No DDIs identified with Taltz    Clinical Intervention Outcomes: Prevention of an adverse drug event   Advertising account planner

## 2023-12-06 ENCOUNTER — Other Ambulatory Visit: Payer: Self-pay

## 2023-12-09 ENCOUNTER — Other Ambulatory Visit: Payer: Self-pay

## 2023-12-30 ENCOUNTER — Other Ambulatory Visit: Payer: Self-pay

## 2024-01-01 ENCOUNTER — Other Ambulatory Visit: Payer: Self-pay

## 2024-01-01 NOTE — Progress Notes (Signed)
 Specialty Pharmacy Refill Coordination Note  [name] is a 73 y.o. female contacted today regarding refills of specialty medication(s) Ixekizumab  (Taltz )   Patient requested Delivery   Delivery date: 01/09/24   Verified address: [address] [place]   Medication will be filled on: 01/08/24

## 2024-01-08 ENCOUNTER — Other Ambulatory Visit: Payer: Self-pay

## 2024-01-27 NOTE — Progress Notes (Deleted)
 Office Visit Note  Patient: [name]             Date of Birth: Jan 26, 1951           MRN: [id number]             PCP: Hartwell Area, PA-C Referring: Hartwell Area, PA-C Visit Date: 02/10/2024 Occupation: Data Unavailable  Subjective:  No chief complaint on file.   History of Present Illness: [name] is a 73 y.o. female ***     Activities of Daily Living:  Patient reports morning stiffness for *** {minute/hour:19697}.   Patient {ACTIONS;DENIES/REPORTS:21021675::Denies} nocturnal pain.  Difficulty dressing/grooming: {ACTIONS;DENIES/REPORTS:21021675::Denies} Difficulty climbing stairs: {ACTIONS;DENIES/REPORTS:21021675::Denies} Difficulty getting out of chair: {ACTIONS;DENIES/REPORTS:21021675::Denies} Difficulty using hands for taps, buttons, cutlery, and/or writing: {ACTIONS;DENIES/REPORTS:21021675::Denies}  No Rheumatology ROS completed.   PMFS History:  Patient Active Problem List   Diagnosis Date Noted   Dyslipidemia 10/16/2016   History of total knee replacement, bilateral 10/16/2016   Primary osteoarthritis of both hands 10/16/2016   History of diabetes mellitus 10/16/2016   Other chronic pain 10/16/2016   History of glaucoma 10/16/2016   Psoriatic arthropathy (HCC) 07/06/2016   Other psoriasis 07/06/2016   High risk medications (not anticoagulants) long-term use 07/06/2016   Fibromyalgia 07/06/2016   Chronic fatigue 07/06/2016   Primary insomnia 07/06/2016    Past Medical History:  Diagnosis Date   Arthritis    Cataract    Complication of anesthesia    AKI post left TKA, required dialysis x 3 week, 2005   Diabetes mellitus without complication (HCC)    Glaucoma    Hypertension    Psoriatic arthritis (HCC)    Vertigo     Family History  Problem Relation Age of Onset   Cancer Sister    Diabetes Sister    COPD Brother    Diabetes Brother    Past Surgical History:  Procedure Laterality Date   ABDOMINAL HYSTERECTOMY      APPENDECTOMY     CARPAL TUNNEL RELEASE     cataract surgery Bilateral    CHOLECYSTECTOMY     FEMUR FRACTURE SURGERY Right    FIBULA FRACTURE SURGERY Left 09/01/2018   LEG AMPUTATION Left 03/25/2020   REPLACEMENT TOTAL KNEE BILATERAL     TIBIA FRACTURE SURGERY Left 09/01/2018   TOOTH EXTRACTION N/A 08/10/2022   Procedure: EXTRACTION TEETH NUMBER THREE, SIX, EIGHT, NINE, TEN, ELEVEN, TWELVE, THIRTEEN, FOURTEEN, FIFTEEN, NINETEEN, TWENTY-ONE, TWENTY-TWO, TWENTY-THREE, TWENTY-FOUR, TWENTY-FIVE, TWENTY-SIX, TWENTY-SEVEN, TWENTY-EIGHT, THIRTY, ALVEOLOPLASTY;  Surgeon: Sheryle Hamilton, DMD;  Location: [facility] OR;  Service: Oral Surgery;  Laterality: N/A;   TOTAL SHOULDER ARTHROPLASTY     TRIGGER FINGER RELEASE     TRIGGER FINGER RELEASE Right 02/2017   3rd and 4th digits.   Social History   Tobacco Use   Smoking status: Never    Passive exposure: Past   Smokeless tobacco: Never  Vaping Use   Vaping status: Never Used  Substance Use Topics   Alcohol use: Never   Drug use: Never   Social History   Social History Narrative   Not on file     Immunization History  Administered Date(s) Administered   [employer] (J&J) SARS-COV-2 Vaccination 12/03/2019, 02/22/2020     Objective: Vital Signs: There were no vitals taken for this visit.   Physical Exam   Musculoskeletal Exam: ***  CDAI Exam: CDAI Score: -- Patient Global: --; Provider Global: -- Swollen: --; Tender: -- Joint Exam 02/10/2024   No joint exam has been documented for this  visit   There is currently no information documented on the homunculus. Go to the Rheumatology activity and complete the homunculus joint exam.  Investigation: No additional findings.  Imaging: No results found.  Recent Labs: Lab Results  Component Value Date   WBC 10.3 09/09/2023   HGB 13.4 09/09/2023   PLT 207 09/09/2023   NA 140 09/09/2023   K 5.0 09/09/2023   CL 103 09/09/2023   CO2 30 09/09/2023   GLUCOSE 194 (H) 09/09/2023   BUN 17  09/09/2023   CREATININE 0.97 09/09/2023   BILITOT 0.4 09/09/2023   ALKPHOS 70 07/18/2016   AST 8 (L) 09/09/2023   ALT 8 09/09/2023   PROT 6.1 09/09/2023   ALBUMIN  3.8 07/18/2016   CALCIUM 9.7 09/09/2023   GFRAA 73 08/17/2020   QFTBGOLDPLUS NEGATIVE 12/31/2022    Speciality Comments: Otezla  started 08/24/20  Procedures:  No procedures performed Allergies: Apremilast , Bee venom, Vancomycin, Aspirin, Cephalosporins, Clarithromycin, Clindamycin/lincomycin, Demerol, E-mycin [erythromycin base], Etodolac, Gatifloxacin, Iodinated contrast media, Levaquin  [levofloxacin in d5w], Levofloxacin, Levofloxacin, Lyrica [pregabalin], Meperidine, Morphine and codeine, Nsaids, Phenothiazines, Shellfish protein-containing drug products, Tequin, Codeine, Penicillins, and Sulfa drugs cross reactors   Assessment / Plan:     Visit Diagnoses: Psoriatic arthropathy (HCC)  Other psoriasis  High risk medication use  Primary osteoarthritis of both hands  History of total right knee replacement  Above knee amputation of left lower extremity (HCC)  Age-related osteoporosis without current pathological fracture  Fibromyalgia  Primary insomnia  Contracture of hand joint, right  History of fracture of tibia  History of fibula fracture  Dyslipidemia  History of diabetes mellitus  Orders: No orders of the defined types were placed in this encounter.  No orders of the defined types were placed in this encounter.   Face-to-face time spent with patient was *** minutes. Greater than 50% of time was spent in counseling and coordination of care.  Follow-Up Instructions: No follow-ups on file.   Waddell CHRISTELLA Craze, PA-C  Note - This record has been created using Dragon software.  Chart creation errors have been sought, but may not always  have been located. Such creation errors do not reflect on  the standard of medical care.

## 2024-01-29 ENCOUNTER — Other Ambulatory Visit: Payer: Self-pay

## 2024-01-29 NOTE — Progress Notes (Signed)
 Specialty Pharmacy Refill Coordination Note  [name] is a 73 y.o. female contacted today regarding refills of specialty medication(s) Ixekizumab  (Taltz )   Patient requested Delivery   Delivery date: 02/07/24   Verified address: [address] [place]   Medication will be filled on: 02/06/24   Next injection due 12.25.25

## 2024-02-06 ENCOUNTER — Other Ambulatory Visit: Payer: Self-pay

## 2024-02-10 ENCOUNTER — Ambulatory Visit: Admitting: Physician Assistant

## 2024-02-10 DIAGNOSIS — Z96651 Presence of right artificial knee joint: Secondary | ICD-10-CM

## 2024-02-10 DIAGNOSIS — L408 Other psoriasis: Secondary | ICD-10-CM

## 2024-02-10 DIAGNOSIS — M797 Fibromyalgia: Secondary | ICD-10-CM

## 2024-02-10 DIAGNOSIS — M810 Age-related osteoporosis without current pathological fracture: Secondary | ICD-10-CM

## 2024-02-10 DIAGNOSIS — M24541 Contracture, right hand: Secondary | ICD-10-CM

## 2024-02-10 DIAGNOSIS — Z79899 Other long term (current) drug therapy: Secondary | ICD-10-CM

## 2024-02-10 DIAGNOSIS — Z8781 Personal history of (healed) traumatic fracture: Secondary | ICD-10-CM

## 2024-02-10 DIAGNOSIS — E785 Hyperlipidemia, unspecified: Secondary | ICD-10-CM

## 2024-02-10 DIAGNOSIS — F5101 Primary insomnia: Secondary | ICD-10-CM

## 2024-02-10 DIAGNOSIS — Z8639 Personal history of other endocrine, nutritional and metabolic disease: Secondary | ICD-10-CM

## 2024-02-10 DIAGNOSIS — S78112A Complete traumatic amputation at level between left hip and knee, initial encounter: Secondary | ICD-10-CM

## 2024-02-10 DIAGNOSIS — L4050 Arthropathic psoriasis, unspecified: Secondary | ICD-10-CM

## 2024-02-10 DIAGNOSIS — M19041 Primary osteoarthritis, right hand: Secondary | ICD-10-CM

## 2024-02-27 ENCOUNTER — Other Ambulatory Visit: Payer: Self-pay | Admitting: Physician Assistant

## 2024-02-27 ENCOUNTER — Other Ambulatory Visit: Payer: Self-pay

## 2024-02-27 ENCOUNTER — Other Ambulatory Visit (HOSPITAL_COMMUNITY): Payer: Self-pay

## 2024-02-27 DIAGNOSIS — Z79899 Other long term (current) drug therapy: Secondary | ICD-10-CM

## 2024-02-27 DIAGNOSIS — L408 Other psoriasis: Secondary | ICD-10-CM

## 2024-02-27 DIAGNOSIS — L4050 Arthropathic psoriasis, unspecified: Secondary | ICD-10-CM

## 2024-02-27 MED ORDER — TALTZ 80 MG/ML ~~LOC~~ SOAJ
80.0000 mg | SUBCUTANEOUS | 0 refills | Status: AC
  Filled 2024-02-27 – 2024-02-28 (×2): qty 1, 28d supply, fill #0

## 2024-02-27 NOTE — Telephone Encounter (Signed)
 Last Fill: 12/04/2023  Labs: 11/25/2023 Glucose 228, Total Protein 5.9, AST 11, Calcium 8.4, CBC WNL  TB Gold: 12/31/2022 Neg    Next Visit: 03/30/2024  Last Visit: 09/09/2023  IK:Ednmpjupr arthropathy   Current Dose per office note 09/09/2023: Taltz  80 mg sq inj   Left message to advise patient she is due to update labs.   Okay to refill Taltz ?

## 2024-02-28 ENCOUNTER — Other Ambulatory Visit: Payer: Self-pay

## 2024-03-02 ENCOUNTER — Other Ambulatory Visit: Payer: Self-pay | Admitting: *Deleted

## 2024-03-02 DIAGNOSIS — Z111 Encounter for screening for respiratory tuberculosis: Secondary | ICD-10-CM

## 2024-03-02 DIAGNOSIS — Z79899 Other long term (current) drug therapy: Secondary | ICD-10-CM

## 2024-03-03 ENCOUNTER — Other Ambulatory Visit (HOSPITAL_COMMUNITY): Payer: Self-pay

## 2024-03-03 ENCOUNTER — Ambulatory Visit: Payer: Self-pay | Admitting: Physician Assistant

## 2024-03-03 ENCOUNTER — Other Ambulatory Visit: Payer: Self-pay

## 2024-03-03 NOTE — Progress Notes (Signed)
 Specialty Pharmacy Refill Coordination Note  NANCEY KREITZ is a 74 y.o. female contacted today regarding refills of specialty medication(s) Ixekizumab  (Taltz )   Patient requested Delivery   Delivery date: 03/12/24   Verified address: [address] [place]   Medication will be filled on: 03/11/24

## 2024-03-03 NOTE — Progress Notes (Signed)
 Glucose is elevated-205, rest of CMP stable.  CBC stable.

## 2024-03-04 LAB — CBC WITH DIFFERENTIAL/PLATELET
Absolute Lymphocytes: 2915 {cells}/uL (ref 850–3900)
Absolute Monocytes: 638 {cells}/uL (ref 200–950)
Basophils Absolute: 22 {cells}/uL (ref 0–200)
Basophils Relative: 0.2 %
Eosinophils Absolute: 121 {cells}/uL (ref 15–500)
Eosinophils Relative: 1.1 %
HCT: 39.6 % (ref 35.9–46.0)
Hemoglobin: 12.6 g/dL (ref 11.7–15.5)
MCH: 29 pg (ref 27.0–33.0)
MCHC: 31.8 g/dL (ref 31.6–35.4)
MCV: 91.2 fL (ref 81.4–101.7)
MPV: 10.3 fL (ref 7.5–12.5)
Monocytes Relative: 5.8 %
Neutro Abs: 7304 {cells}/uL (ref 1500–7800)
Neutrophils Relative %: 66.4 %
Platelets: 305 Thousand/uL (ref 140–400)
RBC: 4.34 Million/uL (ref 3.80–5.10)
RDW: 12 % (ref 11.0–15.0)
Total Lymphocyte: 26.5 %
WBC: 11 Thousand/uL — ABNORMAL HIGH (ref 3.8–10.8)

## 2024-03-04 LAB — COMPREHENSIVE METABOLIC PANEL WITH GFR
AG Ratio: 1.3 (calc) (ref 1.0–2.5)
ALT: 11 U/L (ref 6–29)
AST: 8 U/L — ABNORMAL LOW (ref 10–35)
Albumin: 3.5 g/dL — ABNORMAL LOW (ref 3.6–5.1)
Alkaline phosphatase (APISO): 64 U/L (ref 37–153)
BUN: 18 mg/dL (ref 7–25)
CO2: 32 mmol/L (ref 20–32)
Calcium: 9.1 mg/dL (ref 8.6–10.4)
Chloride: 101 mmol/L (ref 98–110)
Creat: 0.82 mg/dL (ref 0.60–1.00)
Globulin: 2.7 g/dL (ref 1.9–3.7)
Glucose, Bld: 205 mg/dL — ABNORMAL HIGH (ref 65–99)
Potassium: 4.9 mmol/L (ref 3.5–5.3)
Sodium: 139 mmol/L (ref 135–146)
Total Bilirubin: 0.3 mg/dL (ref 0.2–1.2)
Total Protein: 6.2 g/dL (ref 6.1–8.1)
eGFR: 75 mL/min/1.73m2

## 2024-03-04 LAB — QUANTIFERON-TB GOLD PLUS
Mitogen-NIL: >10 [IU]/mL
NIL: 0.02 [IU]/mL
QuantiFERON-TB Gold Plus: NEGATIVE
TB1-NIL: 0 [IU]/mL
TB2-NIL: <0 [IU]/mL

## 2024-03-05 NOTE — Progress Notes (Signed)
 TB gold negative

## 2024-03-10 ENCOUNTER — Other Ambulatory Visit: Payer: Self-pay

## 2024-03-11 ENCOUNTER — Other Ambulatory Visit: Payer: Self-pay

## 2024-03-16 NOTE — Progress Notes (Unsigned)
 "  Office Visit Note  Patient: [name]             Date of Birth: January 25, 1951           MRN: [id number]             PCP: Hartwell Area, PA-C Referring: Hartwell Area, PA-C Visit Date: 03/30/2024 Occupation: Data Unavailable  Subjective:  No chief complaint on file.   History of Present Illness: [name] is a 74 y.o. female ***     Activities of Daily Living:  Patient reports morning stiffness for *** {minute/hour:19697}.   Patient {ACTIONS;DENIES/REPORTS:21021675::Denies} nocturnal pain.  Difficulty dressing/grooming: {ACTIONS;DENIES/REPORTS:21021675::Denies} Difficulty climbing stairs: {ACTIONS;DENIES/REPORTS:21021675::Denies} Difficulty getting out of chair: {ACTIONS;DENIES/REPORTS:21021675::Denies} Difficulty using hands for taps, buttons, cutlery, and/or writing: {ACTIONS;DENIES/REPORTS:21021675::Denies}  No Rheumatology ROS completed.   PMFS History:  Patient Active Problem List   Diagnosis Date Noted   Dyslipidemia 10/16/2016   History of total knee replacement, bilateral 10/16/2016   Primary osteoarthritis of both hands 10/16/2016   History of diabetes mellitus 10/16/2016   Other chronic pain 10/16/2016   History of glaucoma 10/16/2016   Psoriatic arthropathy (HCC) 07/06/2016   Other psoriasis 07/06/2016   High risk medications (not anticoagulants) long-term use 07/06/2016   Fibromyalgia 07/06/2016   Chronic fatigue 07/06/2016   Primary insomnia 07/06/2016    Past Medical History:  Diagnosis Date   Arthritis    Cataract    Complication of anesthesia    AKI post left TKA, required dialysis x 3 week, 2005   Diabetes mellitus without complication (HCC)    Glaucoma    Hypertension    Psoriatic arthritis (HCC)    Vertigo     Family History  Problem Relation Age of Onset   Cancer Sister    Diabetes Sister    COPD Brother    Diabetes Brother    Past Surgical History:  Procedure Laterality Date   ABDOMINAL HYSTERECTOMY      APPENDECTOMY     CARPAL TUNNEL RELEASE     cataract surgery Bilateral    CHOLECYSTECTOMY     FEMUR FRACTURE SURGERY Right    FIBULA FRACTURE SURGERY Left 09/01/2018   LEG AMPUTATION Left 03/25/2020   REPLACEMENT TOTAL KNEE BILATERAL     TIBIA FRACTURE SURGERY Left 09/01/2018   TOOTH EXTRACTION N/A 08/10/2022   Procedure: EXTRACTION TEETH NUMBER THREE, SIX, EIGHT, NINE, TEN, ELEVEN, TWELVE, THIRTEEN, FOURTEEN, FIFTEEN, NINETEEN, TWENTY-ONE, TWENTY-TWO, TWENTY-THREE, TWENTY-FOUR, TWENTY-FIVE, TWENTY-SIX, TWENTY-SEVEN, TWENTY-EIGHT, THIRTY, ALVEOLOPLASTY;  Surgeon: Sheryle Hamilton, DMD;  Location: [facility] OR;  Service: Oral Surgery;  Laterality: N/A;   TOTAL SHOULDER ARTHROPLASTY     TRIGGER FINGER RELEASE     TRIGGER FINGER RELEASE Right 02/2017   3rd and 4th digits.   Social History[1] Social History   Social History Narrative   Not on file     Immunization History  Administered Date(s) Administered   [employer] (J&J) SARS-COV-2 Vaccination 12/03/2019, 02/22/2020     Objective: Vital Signs: There were no vitals taken for this visit.   Physical Exam   Musculoskeletal Exam: ***  CDAI Exam: CDAI Score: -- Patient Global: --; Provider Global: -- Swollen: --; Tender: -- Joint Exam 03/30/2024   No joint exam has been documented for this visit   There is currently no information documented on the homunculus. Go to the Rheumatology activity and complete the homunculus joint exam.  Investigation: No additional findings.  Imaging: No results found.  Recent Labs: Lab Results  Component Value Date  WBC 11.0 (H) 03/02/2024   HGB 12.6 03/02/2024   PLT 305 03/02/2024   NA 139 03/02/2024   K 4.9 03/02/2024   CL 101 03/02/2024   CO2 32 03/02/2024   GLUCOSE 205 (H) 03/02/2024   BUN 18 03/02/2024   CREATININE 0.82 03/02/2024   BILITOT 0.3 03/02/2024   ALKPHOS 70 07/18/2016   AST 8 (L) 03/02/2024   ALT 11 03/02/2024   PROT 6.2 03/02/2024   ALBUMIN  3.8 07/18/2016   CALCIUM 9.1  03/02/2024   GFRAA 73 08/17/2020   QFTBGOLDPLUS NEGATIVE 03/02/2024    Speciality Comments: Otezla  started 08/24/20  Procedures:  No procedures performed Allergies: Apremilast , Bee venom, Vancomycin, Aspirin, Cephalosporins, Clarithromycin, Clindamycin/lincomycin, Demerol, E-mycin [erythromycin base], Etodolac, Gatifloxacin, Iodinated contrast media, Levaquin  [levofloxacin in d5w], Levofloxacin, Levofloxacin, Lyrica [pregabalin], Meperidine, Morphine and codeine, Nsaids, Phenothiazines, Shellfish protein-containing drug products, Tequin, Codeine, Penicillins, and Sulfa drugs cross reactors   Assessment / Plan:     Visit Diagnoses: Psoriatic arthropathy (HCC)  Other psoriasis  High risk medication use  Primary osteoarthritis of both hands  Above knee amputation of left lower extremity (HCC)  History of total right knee replacement  Age-related osteoporosis without current pathological fracture  Fibromyalgia  Primary insomnia  Contracture of hand joint, right  History of fracture of tibia  History of fibula fracture  Dyslipidemia  History of diabetes mellitus  Orders: No orders of the defined types were placed in this encounter.  No orders of the defined types were placed in this encounter.   Face-to-face time spent with patient was *** minutes. Greater than 50% of time was spent in counseling and coordination of care.  Follow-Up Instructions: No follow-ups on file.   Waddell CHRISTELLA Craze, PA-C  Note - This record has been created using Dragon software.  Chart creation errors have been sought, but may not always  have been located. Such creation errors do not reflect on  the standard of medical care.     [1]  Social History Tobacco Use   Smoking status: Never    Passive exposure: Past   Smokeless tobacco: Never  Vaping Use   Vaping status: Never Used  Substance Use Topics   Alcohol use: Never   Drug use: Never   "

## 2024-03-30 ENCOUNTER — Ambulatory Visit: Admitting: Physician Assistant

## 2024-03-30 DIAGNOSIS — L4050 Arthropathic psoriasis, unspecified: Secondary | ICD-10-CM

## 2024-03-30 DIAGNOSIS — M19041 Primary osteoarthritis, right hand: Secondary | ICD-10-CM

## 2024-03-30 DIAGNOSIS — Z8781 Personal history of (healed) traumatic fracture: Secondary | ICD-10-CM

## 2024-03-30 DIAGNOSIS — M797 Fibromyalgia: Secondary | ICD-10-CM

## 2024-03-30 DIAGNOSIS — L408 Other psoriasis: Secondary | ICD-10-CM

## 2024-03-30 DIAGNOSIS — F5101 Primary insomnia: Secondary | ICD-10-CM

## 2024-03-30 DIAGNOSIS — E785 Hyperlipidemia, unspecified: Secondary | ICD-10-CM

## 2024-03-30 DIAGNOSIS — Z8639 Personal history of other endocrine, nutritional and metabolic disease: Secondary | ICD-10-CM

## 2024-03-30 DIAGNOSIS — S78112A Complete traumatic amputation at level between left hip and knee, initial encounter: Secondary | ICD-10-CM

## 2024-03-30 DIAGNOSIS — Z96651 Presence of right artificial knee joint: Secondary | ICD-10-CM

## 2024-03-30 DIAGNOSIS — Z79899 Other long term (current) drug therapy: Secondary | ICD-10-CM

## 2024-03-30 DIAGNOSIS — M24541 Contracture, right hand: Secondary | ICD-10-CM

## 2024-03-30 DIAGNOSIS — M810 Age-related osteoporosis without current pathological fracture: Secondary | ICD-10-CM
